# Patient Record
Sex: Female | Born: 1960 | Race: White | Hispanic: No | Marital: Married | Smoking: Former smoker
Health system: Southern US, Community
[De-identification: ages and names within clinical notes are randomized; demographics above are authoritative.]

## PROBLEM LIST (undated history)

## (undated) DIAGNOSIS — Z8619 Personal history of other infectious and parasitic diseases: Secondary | ICD-10-CM

## (undated) DIAGNOSIS — F419 Anxiety disorder, unspecified: Secondary | ICD-10-CM

## (undated) DIAGNOSIS — E559 Vitamin D deficiency, unspecified: Secondary | ICD-10-CM

## (undated) DIAGNOSIS — N939 Abnormal uterine and vaginal bleeding, unspecified: Secondary | ICD-10-CM

## (undated) DIAGNOSIS — K635 Polyp of colon: Secondary | ICD-10-CM

## (undated) DIAGNOSIS — C801 Malignant (primary) neoplasm, unspecified: Secondary | ICD-10-CM

## (undated) DIAGNOSIS — M5412 Radiculopathy, cervical region: Secondary | ICD-10-CM

## (undated) DIAGNOSIS — D219 Benign neoplasm of connective and other soft tissue, unspecified: Secondary | ICD-10-CM

## (undated) DIAGNOSIS — Z9889 Other specified postprocedural states: Secondary | ICD-10-CM

## (undated) DIAGNOSIS — R5383 Other fatigue: Secondary | ICD-10-CM

## (undated) DIAGNOSIS — F509 Eating disorder, unspecified: Secondary | ICD-10-CM

## (undated) DIAGNOSIS — I499 Cardiac arrhythmia, unspecified: Secondary | ICD-10-CM

## (undated) DIAGNOSIS — R002 Palpitations: Secondary | ICD-10-CM

## (undated) DIAGNOSIS — J302 Other seasonal allergic rhinitis: Secondary | ICD-10-CM

## (undated) DIAGNOSIS — K5190 Ulcerative colitis, unspecified, without complications: Secondary | ICD-10-CM

## (undated) DIAGNOSIS — K921 Melena: Secondary | ICD-10-CM

## (undated) DIAGNOSIS — E041 Nontoxic single thyroid nodule: Secondary | ICD-10-CM

## (undated) DIAGNOSIS — R0602 Shortness of breath: Secondary | ICD-10-CM

## (undated) DIAGNOSIS — M542 Cervicalgia: Secondary | ICD-10-CM

## (undated) DIAGNOSIS — M8580 Other specified disorders of bone density and structure, unspecified site: Secondary | ICD-10-CM

## (undated) DIAGNOSIS — D259 Leiomyoma of uterus, unspecified: Secondary | ICD-10-CM

## (undated) DIAGNOSIS — M25561 Pain in right knee: Secondary | ICD-10-CM

## (undated) DIAGNOSIS — I10 Essential (primary) hypertension: Secondary | ICD-10-CM

## (undated) DIAGNOSIS — N959 Unspecified menopausal and perimenopausal disorder: Secondary | ICD-10-CM

## (undated) DIAGNOSIS — C189 Malignant neoplasm of colon, unspecified: Secondary | ICD-10-CM

## (undated) HISTORY — DX: Abnormal uterine and vaginal bleeding, unspecified: N93.9

## (undated) HISTORY — DX: Essential (primary) hypertension: I10

## (undated) HISTORY — DX: Benign neoplasm of connective and other soft tissue, unspecified: D21.9

## (undated) HISTORY — DX: Other specified postprocedural states: Z98.890

## (undated) HISTORY — DX: Palpitations: R00.2

## (undated) HISTORY — DX: Nontoxic single thyroid nodule: E04.1

## (undated) HISTORY — DX: Melena: K92.1

## (undated) HISTORY — DX: Polyp of colon: K63.5

## (undated) HISTORY — DX: Leiomyoma of uterus, unspecified: D25.9

## (undated) HISTORY — DX: Cardiac arrhythmia, unspecified: I49.9

## (undated) HISTORY — DX: Vitamin D deficiency, unspecified: E55.9

## (undated) HISTORY — DX: Pain in right knee: M25.561

## (undated) HISTORY — DX: Malignant neoplasm of colon, unspecified: C18.9

## (undated) HISTORY — DX: Other specified disorders of bone density and structure, unspecified site: M85.80

## (undated) HISTORY — DX: Other seasonal allergic rhinitis: J30.2

## (undated) HISTORY — DX: Eating disorder, unspecified: F50.9

## (undated) HISTORY — DX: Anxiety disorder, unspecified: F41.9

## (undated) HISTORY — DX: Personal history of other infectious and parasitic diseases: Z86.19

## (undated) HISTORY — DX: Other fatigue: R53.83

## (undated) HISTORY — DX: Radiculopathy, cervical region: M54.12

## (undated) HISTORY — DX: Ulcerative colitis, unspecified, without complications: K51.90

## (undated) HISTORY — DX: Unspecified menopausal and perimenopausal disorder: N95.9

## (undated) HISTORY — DX: Malignant (primary) neoplasm, unspecified: C80.1

## (undated) HISTORY — DX: Cervicalgia: M54.2

## (undated) HISTORY — DX: Shortness of breath: R06.02

## (undated) HISTORY — PX: KNEE ARTHROSCOPY: SUR90

---

## 2000-08-13 ENCOUNTER — Other Ambulatory Visit: Admission: RE | Admit: 2000-08-13 | Discharge: 2000-08-13 | Payer: Self-pay | Admitting: Gynecology

## 2001-08-31 ENCOUNTER — Other Ambulatory Visit: Admission: RE | Admit: 2001-08-31 | Discharge: 2001-08-31 | Payer: Self-pay | Admitting: Gynecology

## 2002-09-26 ENCOUNTER — Other Ambulatory Visit: Admission: RE | Admit: 2002-09-26 | Discharge: 2002-09-26 | Payer: Self-pay | Admitting: Gynecology

## 2003-11-18 DIAGNOSIS — C801 Malignant (primary) neoplasm, unspecified: Secondary | ICD-10-CM

## 2003-11-18 HISTORY — DX: Malignant (primary) neoplasm, unspecified: C80.1

## 2003-11-18 HISTORY — PX: COLON SURGERY: SHX602

## 2004-03-28 ENCOUNTER — Encounter (INDEPENDENT_AMBULATORY_CARE_PROVIDER_SITE_OTHER): Payer: Self-pay | Admitting: *Deleted

## 2004-03-28 ENCOUNTER — Inpatient Hospital Stay (HOSPITAL_COMMUNITY): Admission: RE | Admit: 2004-03-28 | Discharge: 2004-04-01 | Payer: Self-pay | Admitting: General Surgery

## 2004-08-19 ENCOUNTER — Other Ambulatory Visit: Admission: RE | Admit: 2004-08-19 | Discharge: 2004-08-19 | Payer: Self-pay | Admitting: Gynecology

## 2005-11-25 ENCOUNTER — Other Ambulatory Visit: Admission: RE | Admit: 2005-11-25 | Discharge: 2005-11-25 | Payer: Self-pay | Admitting: Gynecology

## 2008-11-06 ENCOUNTER — Ambulatory Visit: Payer: Self-pay | Admitting: Radiology

## 2008-11-06 ENCOUNTER — Ambulatory Visit (HOSPITAL_BASED_OUTPATIENT_CLINIC_OR_DEPARTMENT_OTHER): Admission: RE | Admit: 2008-11-06 | Discharge: 2008-11-06 | Payer: Self-pay | Admitting: Family Medicine

## 2010-03-14 ENCOUNTER — Ambulatory Visit (HOSPITAL_BASED_OUTPATIENT_CLINIC_OR_DEPARTMENT_OTHER): Admission: RE | Admit: 2010-03-14 | Discharge: 2010-03-14 | Payer: Self-pay | Admitting: Gynecology

## 2010-11-17 HISTORY — PX: ENDOMETRIAL ABLATION: SHX621

## 2010-11-17 HISTORY — PX: MYOMECTOMY: SHX85

## 2011-02-04 LAB — POCT PREGNANCY, URINE: Preg Test, Ur: NEGATIVE

## 2011-02-04 LAB — POCT HEMOGLOBIN-HEMACUE: Hemoglobin: 13.9 g/dL (ref 12.0–15.0)

## 2011-04-04 NOTE — Discharge Summary (Signed)
NAMEMITCHELLE, Courtney Watson NO.:  [id number]   MEDICAL RECORD NO.:  [id number]                   PATIENT TYPE:  INP   LOCATION:  0470                                 FACILITY:  [facility]   PHYSICIAN:  Adolph Pollack, M.D.            DATE OF BIRTH:  1961-07-31   DATE OF ADMISSION:  03/28/2004  DATE OF DISCHARGE:  04/01/2004                                 DISCHARGE SUMMARY   PRINCIPAL DISCHARGE DIAGNOSIS:  Stage I sigmoid colon cancer.   SECONDARY DIAGNOSES:  None.   PROCEDURE:  Laparoscopic-assisted sigmoid colectomy.   REASON FOR ADMISSION:  This 50 year old female had some change in bowel  habits and some rectal bleeding.  She was seen by a gastroenterologist (Dr.  Vashti Hey, III, M.D.).  She underwent colonoscopy, and had multiple  polyps in her sigmoid colon, one of which was adenocarcinoma extending to  the submucosa.  It may have been completely excised; however, the margin was  less than 1 mm, and there was question as to whether there was residual  tumor.  She was admitted for elective partial colectomy.   HOSPITAL COURSE:  She underwent the above operation.  Postoperatively, she  did fairly well.  She began moving her bowels.  Pathology was negative for  residual cancer, and lymph nodes were negative.  The biopsy site was  identified.  She began tolerating a diet, and the diet was advanced to  regular.  Her bowels were working, the wounds looked good, and she was  discharged Apr 01, 2004 on postoperative day #4.   DISPOSITION:  Discharged to home in satisfactory condition, Apr 01, 2004.   DISCHARGE MEDICATIONS:  She was given Vicodin for pain.   ACTIVITY:  She was given activity restrictions.   FOLLOW UP:  She is due to see me back in a few days for staple removal.                                               Adolph Pollack, M.D.    TJR/MEDQ  D:  04/10/2004  T:  04/10/2004  Job:  [id number]   cc:   Vashti Hey  [address]  [place]  [place] [address]  Fax: [phone]   Almedia Balls  [address]  [place]  [place] [address]  Fax: [phone]

## 2011-04-04 NOTE — Op Note (Signed)
NAME:  Courtney Watson, Courtney Watson NO.:  [id number]   MEDICAL RECORD NO.:  [id number]                   PATIENT TYPE:  INP   LOCATION:  0002                                 FACILITY:  [facility]   PHYSICIAN:  Adolph Pollack, M.D.            DATE OF BIRTH:  1961-09-06   DATE OF PROCEDURE:  DATE OF DISCHARGE:                                 OPERATIVE REPORT   PREOPERATIVE DIAGNOSIS:  Sigmoid colon cancer.   POSTOPERATIVE DIAGNOSIS:  Sigmoid colon cancer.   PROCEDURE:  Laparoscopic-assisted sigmoid colectomy.   SURGEON:  Adolph Pollack, M.D.   ASSISTANT:  Leonie Man, M.D.   ANESTHESIA:  General.   INDICATIONS:  Courtney Watson is a 50 year old female with a change in bowel  habits and had some bright red blood per rectum.  A colonoscopy was  performed by Dr. Marcelene Butte, and she was found to have some sigmoid colon  lesions.  One large polyp was about 2 cm in size, and upon biopsy, it was a  tubovillous adenoma with a base of adenocarcinoma in it.  She subsequently  went back and had the area tattooed along with other sites of polypectomy.  She now presents for a laparoscopic partial colectomy in the sigmoid region.   PROCEDURE:  The risks were explained to her preoperatively.   TECHNIQUE:  She was seen in the holding area and brought to the operating  room, placed supine on the operating room table.  A general anesthetic was  administered.  A Foley catheter was placed in her bladder, and she was  placed in the lithotomy position.  The abdominal wall and perineum were  sterilely prepped and draped.  A small subumbilical  incision was made  through the skin, subcutaneous tissue, fascia, and peritoneum.  A purse-  string suture of 0 Vicryl was placed around the fascial edges.  A Hasson  trocar  was introduced into the peritoneal cavity, and pneumoperitoneum was  created by insufflation of CO2 gas.  Next, under direct vision, a 5 mm  trocar was placed in the  right lower quadrant region.  I used an atraumatic  clamp and began manipulating the bowel.  She had fairly redundant sigmoid  colon, and I was able to pull the sigmoid colon up out of the pelvis and  identify the rectosigmoid junction in the peritoneal reflection area.  I  noted multiple tattoo marks, and the one at 30 cm was easily seen.  I added  a 10 mm trocar through a small lower midline incision.  I then began  mobilizing the sigmoid and descending colon by dividing the lateral  attachments using harmonic scalpel all the way up to the area distal to the  splenic flexure.  I then used blunt dissection to medialize the colon.  I  carried this mobilization down into the pelvic area, localizing the  rectosigmoid junction.  I identified the left ureter and  the right ureter,  and they were kept posterior to the plane of dissection.  I incised the  medial peritoneum on the mesentery, further mobilizing the area.  I was able  to pull the descending colon down to the pubic area and then to the pelvis.  At this point in time, I then made a lower large transverse incision,  incising skin, subcutaneous tissue, and fascia.  The muscle was split  bluntly, and the peritoneum incised.  I grasped the polyp to be a tattooed  mark at 30 cm and brought the sigmoid colon up into the lesion.  I divided  the sigmoid colon just above the sigmoid colon/descending junction.  I then  divided the mesentery between clamps and ligated the vessels, creating a  wedge-shaped mesentery resection.  I then divided the colon approximately 3-  4 cm distal to the last tattooing mark at the rectosigmoid junction and  handed the specimen off the field, marking the distal colon with a suture.   Following this, I inspected the two ends of the colon, the rectosigmoid,  descending colon, and created a single layer end-to-end anastomosis with  interrupted 3-0 silk suture.  The anastomosis was patent, viable, and under  no  tension.  Gloves were changed at this time.  I then irrigated out the  abdominal cavity with copious saline solution.  No active bleeding was  noted.  Sponge, needle, and instrument counts were reported to be correct.  I then closed the peritoneum with a running 2-0 Vicryl suture through the  lower transverse incision and close the fascia with a running #1 PDS suture.  The subcutaneous tissue was irrigated, and the skin was closed with staples.  I reinsufflated the abdomen and inspected the area.  No bleeding was noted.  I looked at the anterior surface of the liver and saw no obvious lesions.  I  subsequently removed the trocars and released the pneumoperitoneum.  The  subumbilical fascial defect was closed by tightening up and tightening down  the purse-string suture.  The remaining skin incisions where the trocars  were are then closed with staples.  Sterile dressings were applied.   She tolerated the procedure well without any apparent complications.  She  subsequently was extubated and taken to the recovery room in satisfactory  condition.                                               Adolph Pollack, M.D.    TJR/MEDQ  D:  03/28/2004  T:  03/28/2004  Job:  [id number]   cc:   Vashti Hey  [address]  [place]  [place] [address]  Fax: [phone]   Almedia Balls  [address]  [place]  [place] [address]  Fax: [phone]

## 2012-02-06 ENCOUNTER — Other Ambulatory Visit: Payer: Self-pay | Admitting: Gastroenterology

## 2012-02-18 ENCOUNTER — Other Ambulatory Visit: Payer: Self-pay | Admitting: Gastroenterology

## 2012-02-18 DIAGNOSIS — R110 Nausea: Secondary | ICD-10-CM

## 2012-02-20 ENCOUNTER — Ambulatory Visit
Admission: RE | Admit: 2012-02-20 | Discharge: 2012-02-20 | Disposition: A | Discharge status: Home / Self Care | Payer: 59 | Source: Ambulatory Visit | Attending: Gastroenterology | Admitting: Gastroenterology

## 2012-02-20 ENCOUNTER — Other Ambulatory Visit: Payer: Self-pay | Admitting: Gastroenterology

## 2012-02-20 DIAGNOSIS — R110 Nausea: Secondary | ICD-10-CM

## 2012-02-20 MED ORDER — IOHEXOL 300 MG/ML  SOLN
100.0000 mL | Freq: Once | INTRAMUSCULAR | Status: AC | PRN
  Administered 2012-02-20: 100 mL via INTRAVENOUS

## 2012-03-10 ENCOUNTER — Other Ambulatory Visit: Payer: Self-pay | Admitting: Gynecology

## 2012-06-01 ENCOUNTER — Other Ambulatory Visit: Payer: Self-pay | Admitting: Gastroenterology

## 2012-06-01 ENCOUNTER — Ambulatory Visit
Admission: RE | Admit: 2012-06-01 | Discharge: 2012-06-01 | Disposition: A | Discharge status: Home / Self Care | Payer: 59 | Source: Ambulatory Visit | Attending: Gastroenterology | Admitting: Gastroenterology

## 2012-06-01 DIAGNOSIS — K5900 Constipation, unspecified: Secondary | ICD-10-CM

## 2012-06-15 ENCOUNTER — Other Ambulatory Visit: Payer: Self-pay | Admitting: Gastroenterology

## 2012-11-17 DIAGNOSIS — M8580 Other specified disorders of bone density and structure, unspecified site: Secondary | ICD-10-CM

## 2012-11-17 HISTORY — DX: Other specified disorders of bone density and structure, unspecified site: M85.80

## 2014-08-30 ENCOUNTER — Other Ambulatory Visit: Payer: Self-pay | Admitting: Gastroenterology

## 2014-08-30 ENCOUNTER — Ambulatory Visit
Admission: RE | Admit: 2014-08-30 | Discharge: 2014-08-30 | Disposition: A | Discharge status: Home / Self Care | Payer: 59 | Source: Ambulatory Visit | Attending: Gastroenterology | Admitting: Gastroenterology

## 2014-08-30 DIAGNOSIS — K5900 Constipation, unspecified: Secondary | ICD-10-CM

## 2014-09-12 DIAGNOSIS — C189 Malignant neoplasm of colon, unspecified: Secondary | ICD-10-CM | POA: Insufficient documentation

## 2014-09-12 DIAGNOSIS — M5412 Radiculopathy, cervical region: Secondary | ICD-10-CM | POA: Insufficient documentation

## 2014-09-12 DIAGNOSIS — M542 Cervicalgia: Secondary | ICD-10-CM | POA: Insufficient documentation

## 2014-09-12 DIAGNOSIS — M8580 Other specified disorders of bone density and structure, unspecified site: Secondary | ICD-10-CM | POA: Insufficient documentation

## 2014-09-12 DIAGNOSIS — F419 Anxiety disorder, unspecified: Secondary | ICD-10-CM | POA: Insufficient documentation

## 2014-09-12 HISTORY — DX: Malignant neoplasm of colon, unspecified: C18.9

## 2014-09-12 HISTORY — DX: Radiculopathy, cervical region: M54.12

## 2014-09-12 HISTORY — DX: Cervicalgia: M54.2

## 2015-01-10 DIAGNOSIS — M5412 Radiculopathy, cervical region: Secondary | ICD-10-CM | POA: Insufficient documentation

## 2015-01-10 HISTORY — DX: Radiculopathy, cervical region: M54.12

## 2015-08-21 ENCOUNTER — Ambulatory Visit (INDEPENDENT_AMBULATORY_CARE_PROVIDER_SITE_OTHER): Payer: 59 | Admitting: Family Medicine

## 2015-08-21 ENCOUNTER — Encounter: Payer: Self-pay | Admitting: Family Medicine

## 2015-08-21 VITALS — BP 138/74 | Ht 68.5 in | Wt 135.0 lb

## 2015-08-21 DIAGNOSIS — M25561 Pain in right knee: Secondary | ICD-10-CM

## 2015-08-21 MED ORDER — METHYLPREDNISOLONE ACETATE 40 MG/ML IJ SUSP
40.0000 mg | Freq: Once | INTRAMUSCULAR | Status: AC
  Administered 2015-08-21: 40 mg via INTRA_ARTICULAR
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
## 2015-08-22 DIAGNOSIS — M25561 Pain in right knee: Secondary | ICD-10-CM

## 2015-08-22 HISTORY — DX: Pain in right knee: M25.561

## 2015-08-22 NOTE — Assessment & Plan Note (Signed)
New power walker without an acute injury who presents with knee pain and stiffness for 1.5 months.  Effusion and possible meniscal pathology (anterior medial) identified on u/s.  Exam relatively unremarkable other than terminal flexion pain and tenderness over the anterior joint line. No locking or catching.  - intrarticular injection performed today.   - Offered XR, but Mrs. Bohnenkamp declined.  - Discussed taking NSAIDs if needed.  - Discussed hip and knee isometric exercises as she is a bit week.  - f/u 3-4 weeks. Call with any issues.

## 2015-09-18 ENCOUNTER — Ambulatory Visit: Payer: 59 | Admitting: Family Medicine

## 2017-03-18 ENCOUNTER — Ambulatory Visit (INDEPENDENT_AMBULATORY_CARE_PROVIDER_SITE_OTHER): Payer: 59 | Admitting: Obstetrics and Gynecology

## 2017-03-18 ENCOUNTER — Encounter: Payer: Self-pay | Admitting: Obstetrics and Gynecology

## 2017-03-18 VITALS — BP 122/74 | HR 70 | Resp 20 | Ht 67.75 in | Wt 159.4 lb

## 2017-03-18 DIAGNOSIS — Z780 Asymptomatic menopausal state: Secondary | ICD-10-CM

## 2017-03-18 DIAGNOSIS — Z01419 Encounter for gynecological examination (general) (routine) without abnormal findings: Secondary | ICD-10-CM

## 2017-03-18 DIAGNOSIS — E559 Vitamin D deficiency, unspecified: Secondary | ICD-10-CM

## 2017-03-18 LAB — COMPREHENSIVE METABOLIC PANEL
ALBUMIN: 4.4 g/dL (ref 3.6–5.1)
ALK PHOS: 84 U/L (ref 33–130)
ALT: 16 U/L (ref 6–29)
AST: 21 U/L (ref 10–35)
BUN: 10 mg/dL (ref 7–25)
CO2: 26 mmol/L (ref 20–31)
CREATININE: 0.59 mg/dL (ref 0.50–1.05)
Calcium: 9.7 mg/dL (ref 8.6–10.4)
Chloride: 103 mmol/L (ref 98–110)
Glucose, Bld: 83 mg/dL (ref 65–99)
POTASSIUM: 4.4 mmol/L (ref 3.5–5.3)
SODIUM: 141 mmol/L (ref 135–146)
TOTAL PROTEIN: 7.4 g/dL (ref 6.1–8.1)
Total Bilirubin: 0.3 mg/dL (ref 0.2–1.2)

## 2017-03-18 LAB — CBC
HCT: 42.6 % (ref 35.0–45.0)
HEMOGLOBIN: 14.2 g/dL (ref 11.7–15.5)
MCH: 31.2 pg (ref 27.0–33.0)
MCHC: 33.3 g/dL (ref 32.0–36.0)
MCV: 93.6 fL (ref 80.0–100.0)
MPV: 10.6 fL (ref 7.5–12.5)
PLATELETS: 258 10*3/uL (ref 140–400)
RBC: 4.55 MIL/uL (ref 3.80–5.10)
RDW: 13.8 % (ref 11.0–15.0)
WBC: 6.4 10*3/uL (ref 3.8–10.8)

## 2017-03-18 LAB — LIPID PANEL
CHOL/HDL RATIO: 1.8 ratio (ref <5.0)
CHOLESTEROL: 240 mg/dL — AB (ref <200)
HDL: 133 mg/dL (ref >50)
LDL Cholesterol: 94 mg/dL (ref <100)
Triglycerides: 66 mg/dL (ref <150)
VLDL: 13 mg/dL (ref <30)

## 2017-03-18 LAB — TSH: TSH: 1.57 mIU/L

## 2017-03-18 NOTE — Progress Notes (Addendum)
56 y.o. G41P0000 Married Caucasian female here for annual exam.    Wants blood work done.   No further real hot flashes but does feel warm at night.   Has some inflammatory bowel disease, ulcerative colitis, following her colon cancer treatment.  Does have hx colon cancer.   Gained almost 40 pounds since 2015.  Stopped smoking.   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 Stressful. Going on a trip out [place].   PCP:  Scarlette Ar, MD  Patient's last menstrual period was 11/17/2014 (approximate).           Sexually active: Yes.   female The current method of family planning is post menopausal status.    Exercising: Yes.    Walks 5 miles Sat. & Sun. Smoker:  Yes, vapes daily  Health Maintenance: Pap:  10/2015 normal with negative HR HPV - Dr. Terri Piedra. History of abnormal Pap:  no MMG:  02/2016 normal per patient:[facility]--appt. 04/2017.   Colonoscopy: 11/2010 polyps with Dr.Outlaw--appt.05/2017.    BMD:   2014??  Result :Osteopenia with Dr. Ubaldo Glassing TDaP:  PCP Gardasil:   no HIV: 20 years ago--Neg Hep C: 2017 Neg Screening Labs:  Hb today: PCP, Urine today: not done   reports that she quit smoking about 5 years ago. Her smoking use included Cigarettes. She has never used smokeless tobacco. She reports that she drinks about 9.0 oz of alcohol per week . She reports that she does not use drugs.  Past Medical History:  Diagnosis Date  . Abnormal uterine bleeding    when she had fibroid tumor  . Anxiety   . Cancer [facility]) 2005   colon   . Fibroid   . Osteopenia 2014   with Dr. Ubaldo Glassing  . Seasonal allergies   . Ulcerative colitis ([place])   . Vitamin D deficiency     Past Surgical History:  Procedure Laterality Date  . COLON SURGERY  2005   colon cancer--Dr.Rosenbower  . ENDOMETRIAL ABLATION  2012   Dr.Lomax  . MYOMECTOMY  2012   Dr.Lomax    Current Outpatient Prescriptions  Medication Sig Dispense Refill  . ALPRAZolam (XANAX) 0.5 MG tablet Take 1 tablet by mouth as needed.     . sertraline (ZOLOFT) 50 MG tablet Take 2 tablets by mouth daily.     No current facility-administered medications for this visit.     Family History  Problem Relation Age of Onset  . Osteoporosis Mother   . Hypertension Mother   . Cancer Father     prostate  . Parkinson's disease Father     Dec age 67 from parkinsons/Lewey Body Dementia?  Marland Kitchen Hypertension Father   . Hyperlipidemia Father   . Osteoarthritis Father   . Rheum arthritis Father   . Cancer Maternal Uncle 45    colon cancer  . Diabetes Maternal Grandfather   . Breast cancer Paternal Grandmother     ROS:  Pertinent items are noted in HPI.  Otherwise, a comprehensive ROS was negative.  Exam:   BP 122/74 (BP Location: Right Arm, Patient Position: Sitting, Cuff Size: Normal)   Pulse 70   Resp 20   Ht 5' 7.75" (1.721 m)   Wt 159 lb 6.4 oz (72.3 kg)   LMP 11/17/2014 (Approximate)   BMI 24.42 kg/m     General appearance: alert, cooperative and appears stated age Head: Normocephalic, without obvious abnormality, atraumatic Neck: no adenopathy, supple, symmetrical, trachea midline and thyroid normal to inspection and palpation Lungs: clear to auscultation bilaterally  Breasts: normal appearance, no masses or tenderness, No nipple retraction or dimpling, No nipple discharge or bleeding, No axillary or supraclavicular adenopathy Heart: regular rate and rhythm Abdomen: small umbilical hernia, soft, non-tender; no masses, no organomegaly Extremities: extremities normal, atraumatic, no cyanosis or edema Skin: Skin color, texture, turgor normal. No rashes or lesions Lymph nodes: Cervical, supraclavicular, and axillary nodes normal. No abnormal inguinal nodes palpated Neurologic: Grossly normal  Pelvic: External genitalia:  no lesions              Urethra:  normal appearing urethra with no masses, tenderness or lesions              Bartholins and Skenes: normal                 Vagina: normal appearing vagina with normal  color and discharge, no lesions              Cervix: no lesions              Pap taken: No. Bimanual Exam:  Uterus:  normal size, contour, position, consistency, mobility, non-tender              Adnexa: no mass, fullness, tenderness              Rectal exam: Yes.  .  Confirms.              Anus:  normal sphincter tone, no lesions  Chaperone was present for exam.  Assessment:   Well woman visit with normal exam. Status post endometrial ablation and myomectomy.  Hx osteopenia.  Hx vit D deficiency.  Personal hx colon cancer.  Vaping.  Small umbilical hernia.  Plan: Mammogram screening discussed. Recommended self breast awareness. Pap and HR HPV as above.  Cotesting next year. Guidelines for Calcium, Vitamin D, regular exercise program including cardiovascular and weight bearing exercise. Will fax order to [facility] for BMD. Routine labs and [place], estradiol. Knows she needs to quit vaping.  Signs and symptoms of incarcerated bowel in hernia dicussed with patient.  Follow up annually and prn.       After visit summary provided.

## 2017-03-18 NOTE — Patient Instructions (Signed)

## 2017-03-19 ENCOUNTER — Telehealth: Payer: Self-pay | Admitting: Obstetrics and Gynecology

## 2017-03-19 ENCOUNTER — Other Ambulatory Visit: Payer: Self-pay | Admitting: Obstetrics and Gynecology

## 2017-03-19 DIAGNOSIS — K429 Umbilical hernia without obstruction or gangrene: Secondary | ICD-10-CM

## 2017-03-19 LAB — FOLLICLE STIMULATING HORMONE: FSH: 93.7 m[IU]/mL

## 2017-03-19 LAB — ESTRADIOL: ESTRADIOL: 15 pg/mL

## 2017-03-19 LAB — VITAMIN D 25 HYDROXY (VIT D DEFICIENCY, FRACTURES): VIT D 25 HYDROXY: 19 ng/mL — AB (ref 30–100)

## 2017-03-19 NOTE — Telephone Encounter (Signed)
Spoke with patient, advised referral placed for Dr. Zella Richer. Advised patient referral has been placed, our referral department will follow up with scheduling. Patient verbalizes understanding and is agreeable.  Routing to provider for final review. Patient is agreeable to disposition. Will close encounter.  Cc: Theresia Lo

## 2017-03-19 NOTE — Telephone Encounter (Signed)
Dr. Quincy Simmonds -see patient message below and advise?

## 2017-03-19 NOTE — Telephone Encounter (Signed)
Dr. Quincy Simmonds -ok to place referral for small umbilical hernia to [facility]?

## 2017-03-19 NOTE — Telephone Encounter (Signed)
Patient saw Dr Quincy Simmonds yesterday and states she told her she had a hernia.  Patient can't remember what kind and would like to know so she can tell her physician.

## 2017-03-19 NOTE — Telephone Encounter (Signed)
I just placed the referral for Dr. Zella Richer.

## 2017-03-19 NOTE — Telephone Encounter (Signed)
Patient has a small umbilical hernia.

## 2017-03-19 NOTE — Telephone Encounter (Signed)
Spoke with patient, advised as seen below per Dr. Quincy Simmonds. Patient states she is going to review with her surgeon for recommendations. Patient thankful for return call.  Routing to provider for final review. Patient is agreeable to disposition. Will close encounter.

## 2017-03-19 NOTE — Telephone Encounter (Signed)
Patient called [facility] to make an appointment to get the hernia looked.  They state that she needs a referral and records sent to Dr Zella Richer so she can schedule.

## 2017-03-20 NOTE — Addendum Note (Signed)
Addended by: Yisroel Ramming, BROOK E on: 03/20/2017 01:09 PM   Modules accepted: Orders

## 2017-03-23 ENCOUNTER — Telehealth: Payer: Self-pay | Admitting: *Deleted

## 2017-03-23 MED ORDER — VITAMIN D (ERGOCALCIFEROL) 1.25 MG (50000 UNIT) PO CAPS: 50000.0000 [IU] | ORAL_CAPSULE | ORAL | 0 refills | Status: DC

## 2017-03-23 NOTE — Telephone Encounter (Signed)
Left message to call Courtney Watson at [phone].  

## 2017-03-23 NOTE — Telephone Encounter (Signed)
-----   Message from Nunzio Cobbs, MD sent at 03/20/2017  1:09 PM EDT ----- Please contact patient with results.   Her Vit D level is low at 19.  I am recommending vit D 50,000 IU every 2 weeks for 3 months. Please sent to her pharmacy.  Please schedule a lab recheck in 3 months.  I will order the future lab.   Her cholesterol panel shows elevated cholesterol, but for a good reason.  Her HDL, the good cholesterol, is really high!  Her cholesterol ratios are excellent.   Her blood chemistries, blood counts, and thyroid are all normal.  She is definitely in menopause by her [place] and estradiol.

## 2017-03-23 NOTE — Telephone Encounter (Signed)
Spoke with patient, advised of results and recommendations as seen below per Dr. Quincy Simmonds. Rx for Vit D to verified pharmacy on file. Patient scheduled for Vit D recheck on 06/25/17 at 3pm. Patient verbalizes understanding and is agreeable.  Routing to provider for final review. Patient is agreeable to disposition. Will close encounter.

## 2017-05-10 ENCOUNTER — Encounter: Payer: Self-pay | Admitting: Obstetrics and Gynecology

## 2017-05-11 ENCOUNTER — Telehealth: Payer: Self-pay | Admitting: *Deleted

## 2017-05-11 NOTE — Telephone Encounter (Signed)
Left voicemail to call back re: BMD results. 

## 2017-05-11 NOTE — Telephone Encounter (Signed)
Return call to Reina °

## 2017-05-12 NOTE — Telephone Encounter (Signed)
Patient notified. Verbalized understanding.   BMD report to scan.

## 2017-05-21 ENCOUNTER — Encounter: Payer: Self-pay | Admitting: Obstetrics and Gynecology

## 2017-06-08 DIAGNOSIS — R0602 Shortness of breath: Secondary | ICD-10-CM

## 2017-06-08 HISTORY — DX: Shortness of breath: R06.02

## 2017-06-22 NOTE — Addendum Note (Signed)
Addended by: Graylon Good on: 06/22/2017 08:21 AM   Modules accepted: Orders

## 2017-06-25 ENCOUNTER — Other Ambulatory Visit: Payer: Self-pay

## 2017-06-25 ENCOUNTER — Other Ambulatory Visit (INDEPENDENT_AMBULATORY_CARE_PROVIDER_SITE_OTHER): Payer: 59

## 2017-06-25 DIAGNOSIS — E559 Vitamin D deficiency, unspecified: Secondary | ICD-10-CM

## 2017-06-26 LAB — VITAMIN D 25 HYDROXY (VIT D DEFICIENCY, FRACTURES): VIT D 25 HYDROXY: 32.4 ng/mL (ref 30.0–100.0)

## 2018-01-25 DIAGNOSIS — E559 Vitamin D deficiency, unspecified: Secondary | ICD-10-CM | POA: Insufficient documentation

## 2018-01-25 DIAGNOSIS — K5190 Ulcerative colitis, unspecified, without complications: Secondary | ICD-10-CM | POA: Insufficient documentation

## 2018-01-25 DIAGNOSIS — R5383 Other fatigue: Secondary | ICD-10-CM | POA: Insufficient documentation

## 2018-01-25 HISTORY — DX: Other fatigue: R53.83

## 2018-03-19 ENCOUNTER — Encounter: Payer: Self-pay | Admitting: Obstetrics and Gynecology

## 2018-03-19 ENCOUNTER — Other Ambulatory Visit: Payer: Self-pay

## 2018-03-19 ENCOUNTER — Ambulatory Visit (INDEPENDENT_AMBULATORY_CARE_PROVIDER_SITE_OTHER): Payer: 59 | Admitting: Obstetrics and Gynecology

## 2018-03-19 ENCOUNTER — Other Ambulatory Visit (HOSPITAL_COMMUNITY)
Admission: RE | Admit: 2018-03-19 | Discharge: 2018-03-19 | Disposition: A | Discharge status: Home / Self Care | Payer: 59 | Source: Ambulatory Visit | Attending: Obstetrics and Gynecology | Admitting: Obstetrics and Gynecology

## 2018-03-19 VITALS — BP 118/70 | HR 80 | Resp 16 | Ht 67.0 in | Wt 162.0 lb

## 2018-03-19 DIAGNOSIS — R7989 Other specified abnormal findings of blood chemistry: Secondary | ICD-10-CM

## 2018-03-19 DIAGNOSIS — Z01419 Encounter for gynecological examination (general) (routine) without abnormal findings: Secondary | ICD-10-CM | POA: Diagnosis present

## 2018-03-19 DIAGNOSIS — I499 Cardiac arrhythmia, unspecified: Secondary | ICD-10-CM

## 2018-03-19 DIAGNOSIS — R102 Pelvic and perineal pain: Secondary | ICD-10-CM | POA: Diagnosis not present

## 2018-03-19 NOTE — Progress Notes (Signed)
Patient scheduled at [facility] in [facility] for first available on 04/06/2018 8:40 am with Dr.Munley.

## 2018-03-19 NOTE — Progress Notes (Signed)
[name] here for annual exam.    Has bilateral lower abdominal pain and low back pain.  Feels like menstrual cramps. Wakes her up at night.  No vaginal bleeding.   Has collagenous colitis.  Hx colon cancer.  Has diarrhea and constipation.  No blood in the stool. Does have dark stools.   States hx of palpitations of heart in past.  Not now.   Labs done with PCP.  PCP: Dr. Scarlette Ar    Patient's last menstrual period was 11/17/2014 (within months).           Sexually active: Yes.    The current method of family planning is post menopausal status.    Exercising: No.  The patient does not participate in regular exercise at present. Smoker:  no  Health Maintenance: Pap:  10/2015 normal with negative HR HPV - Dr. Terri Piedra History of abnormal Pap:  no MMG:  04/27/17 BIRADS 2 benign/density c Colonoscopy:  2018 Dr. Paulita Fujita -- normal per patient BMD:   04/27/17  Result  Osteopenia TDaP:  UTD per patient Gardasil:   n/a HIV: Negative in the past Hep C: 2017 Negative Screening Labs:  PCP   reports that she quit smoking about 6 years ago. Her smoking use included cigarettes. She has never used smokeless tobacco. She reports that she drinks about 9.0 oz of alcohol per week. She reports that she does not use drugs.  Past Medical History:  Diagnosis Date  . Abnormal uterine bleeding    when she had fibroid tumor  . Anxiety   . Cancer [facility]) 2005   colon   . Fibroid   . Osteopenia 2018   hips and spine  . Seasonal allergies   . Ulcerative colitis ([place])   . Vitamin D deficiency     Past Surgical History:  Procedure Laterality Date  . COLON SURGERY  2005   colon cancer--Dr.Rosenbower  . ENDOMETRIAL ABLATION  2012   Dr.Lomax  . MYOMECTOMY  2012   Dr.Lomax    Current Outpatient Medications  Medication Sig Dispense Refill  . albuterol (PROVENTIL HFA;VENTOLIN HFA) 108 (90 Base) MCG/ACT inhaler Inhale into the lungs as needed.    .  ALPRAZolam (XANAX) 0.5 MG tablet Take 1 tablet by mouth as needed.    . sertraline (ZOLOFT) 50 MG tablet Take 2 tablets by mouth daily.    . Vitamin D, Ergocalciferol, (DRISDOL) 50000 units CAPS capsule Take 1 capsule (50,000 Units total) by mouth every 14 (fourteen) days. (Patient not taking: Reported on 03/19/2018) 6 capsule 0   No current facility-administered medications for this visit.     Family History  Problem Relation Age of Onset  . Osteoporosis Mother   . Hypertension Mother   . Cancer Father        prostate  . Parkinson's disease Father        Dec age 61 from parkinsons/Lewey Body Dementia?  Marland Kitchen Hypertension Father   . Hyperlipidemia Father   . Osteoarthritis Father   . Rheum arthritis Father   . Cancer Maternal Uncle 45       colon cancer  . Diabetes Maternal Grandfather   . Breast cancer Paternal Grandmother     Review of Systems  Constitutional:       Weight gain   HENT: Negative.   Respiratory: Negative.   Cardiovascular: Negative.   Gastrointestinal: Positive for abdominal pain.       Bloating Constipation Diarrhea Change in quality/character of stools  Endocrine: Negative.   Genitourinary: Negative.   Musculoskeletal:       Muscle or joint pain  Skin: Negative.   Allergic/Immunologic: Negative.   Neurological: Negative.   Hematological: Bruises/bleeds easily.  Psychiatric/Behavioral: Negative.     Exam:   BP 118/70 (BP Location: Right Arm, Patient Position: Sitting, Cuff Size: Normal)   Pulse 80   Resp 16   Ht 5\' 7"  (1.702 m)   Wt 162 lb (73.5 kg)   LMP 11/17/2014 (Within Months)   BMI 25.37 kg/m     General appearance: alert, cooperative and appears stated age Head: Normocephalic, without obvious abnormality, atraumatic Neck: no adenopathy, supple, symmetrical, trachea midline and thyroid normal to inspection and palpation Lungs: clear to auscultation bilaterally Breasts: normal appearance, no masses or tenderness, No nipple retraction or  dimpling, No nipple discharge or bleeding, No axillary or supraclavicular adenopathy Heart: regular rate and irregular rhythm. Abdomen: small umbilical hernia, abdomen is soft, non-tender; no masses, no organomegaly Extremities: extremities normal, atraumatic, no cyanosis or edema Skin: Skin color, texture, turgor normal. No rashes or lesions Lymph nodes: Cervical, supraclavicular, and axillary nodes normal. No abnormal inguinal nodes palpated Neurologic: Grossly normal  Pelvic: External genitalia:  no lesions              Urethra:  normal appearing urethra with no masses, tenderness or lesions              Bartholins and Skenes: normal                 Vagina: normal appearing vagina with normal color and discharge, no lesions              Cervix: no lesions              Pap taken: Yes.   Bimanual Exam:  Uterus:  normal size, contour, position, consistency, mobility, non-tender              Adnexa: no mass, fullness, tenderness              Rectal exam: Yes.  .  Confirms.              Anus:  normal sphincter tone, no lesions  Chaperone was present for exam.  Assessment:   Well woman visit with normal exam. Pelvic pain.  Status post endometrial ablation and myomectomy.  Small umbilical hernia. Hx osteopenia.  Hx vit D deficiency.  Personal hx colon cancer and collagenous colitis. Change in bowel function. Cardiac arrhythmia.   Plan: Mammogram screening yearly.  Recommended self breast awareness. Pap and HR HPV as above. Guidelines for Calcium, Vitamin D, regular exercise program including cardiovascular and weight bearing exercise. Referral to cardiology.  Return for pelvic US.  She will contact her GI for an appointment.   BMD next year.  Will check vit D level today. Follow up annually and prn.   After visit summary provided.

## 2018-03-19 NOTE — Patient Instructions (Signed)

## 2018-03-20 LAB — VITAMIN D 25 HYDROXY (VIT D DEFICIENCY, FRACTURES): Vit D, 25-Hydroxy: 15.8 ng/mL — ABNORMAL LOW (ref 30.0–100.0)

## 2018-03-21 NOTE — Addendum Note (Signed)
Addended by: Yisroel Ramming, Dietrich Pates E on: 03/21/2018 12:33 PM   Modules accepted: Orders

## 2018-03-22 ENCOUNTER — Telehealth: Payer: Self-pay

## 2018-03-22 MED ORDER — VITAMIN D (ERGOCALCIFEROL) 1.25 MG (50000 UNIT) PO CAPS: 50000.0000 [IU] | ORAL_CAPSULE | ORAL | 3 refills | Status: DC

## 2018-03-22 NOTE — Telephone Encounter (Signed)
Returning call to Amanda.

## 2018-03-22 NOTE — Telephone Encounter (Signed)
Spoke with patient and notified of low vitamin d level. Advised needs to go back on Rx Vitamin d 50,000 IU every other week x1year. She needs 3 month recheck with lab appt made 06-11-18. Rx sent to pharmacy on file for.

## 2018-03-22 NOTE — Telephone Encounter (Signed)
Called patient and left message for her to return my call. 

## 2018-03-22 NOTE — Telephone Encounter (Signed)
-----   Message from Nunzio Cobbs, MD sent at 03/21/2018 12:33 PM EDT ----- Please contact patient with results. I recommend she go back on the vitamin D3 50,000 IU every other week.  I will give her refills for one year.  I am recommending she does have a lab recheck in 3 months.  I will place a future order.

## 2018-03-23 ENCOUNTER — Telehealth: Payer: Self-pay | Admitting: Obstetrics and Gynecology

## 2018-03-23 ENCOUNTER — Ambulatory Visit (INDEPENDENT_AMBULATORY_CARE_PROVIDER_SITE_OTHER): Payer: 59

## 2018-03-23 DIAGNOSIS — R102 Pelvic and perineal pain: Secondary | ICD-10-CM

## 2018-03-23 LAB — CYTOLOGY - PAP
Diagnosis: NEGATIVE
HPV: NOT DETECTED

## 2018-03-23 NOTE — Telephone Encounter (Signed)
Patient would like nurse to check if she still need to come in for consult Monday after today's ultrasound. She said everything looked fine.

## 2018-03-23 NOTE — Telephone Encounter (Signed)
Spoke with patient. Advised to keep consult scheduled for 03/29/18 at 10:30am with Dr. Quincy Simmonds. Dr. Quincy Simmonds will review PUS results at that [place]. Patient verbalizes understanding.  Routing to provider for final review. Patient is agreeable to disposition. Will close encounter.

## 2018-03-29 ENCOUNTER — Other Ambulatory Visit: Payer: Self-pay

## 2018-03-29 ENCOUNTER — Ambulatory Visit: Payer: 59 | Admitting: Obstetrics and Gynecology

## 2018-03-29 ENCOUNTER — Encounter: Payer: Self-pay | Admitting: Obstetrics and Gynecology

## 2018-03-29 VITALS — BP 126/70 | HR 88 | Resp 16 | Ht 67.0 in | Wt 163.0 lb

## 2018-03-29 DIAGNOSIS — R109 Unspecified abdominal pain: Secondary | ICD-10-CM | POA: Diagnosis not present

## 2018-03-29 NOTE — Progress Notes (Signed)
GYNECOLOGY  VISIT   HPI: 57 y.o.   Married  Caucasian  female   G0P0000 with Patient's last menstrual period was 11/17/2014 (within months).   here for consult after PUS.  Has symptoms of bilateral lower abdominal cramping pain and low back pain.    Wakes her up at night. Hx endometrial ablation. No vaginal bleeding.     Hx colon cancer and collagenous colitis.  Her GI is Dr. Paulita Fujita.   Has appt with cardiology on 04/06/18.  Still with palpitations.   GYNECOLOGIC HISTORY: Patient's last menstrual period was 11/17/2014 (within months). Contraception:  Postmenopausal Menopausal hormone therapy:  none Last mammogram:  04/27/17 BIRADS 2 benign/density c Last pap smear:   03/19/18 Neg:Neg HR HPV        OB History    Gravida  0   Para  0   Term  0   Preterm  0   AB  0   Living  0     SAB  0   TAB  0   Ectopic  0   Multiple  0   Live Births  0              Patient Active Problem List   Diagnosis Date Noted  . Knee pain, right 08/22/2015    Past Medical History:  Diagnosis Date  . Abnormal uterine bleeding    when she had fibroid tumor  . Anxiety   . Cancer [facility]) 2005   colon   . Fibroid   . Osteopenia 2018   hips and spine  . Seasonal allergies   . Ulcerative colitis ([place])   . Vitamin D deficiency     Past Surgical History:  Procedure Laterality Date  . COLON SURGERY  2005   colon cancer--Dr.Rosenbower  . ENDOMETRIAL ABLATION  2012   Dr.Lomax  . MYOMECTOMY  2012   Dr.Lomax    Current Outpatient Medications  Medication Sig Dispense Refill  . albuterol (PROVENTIL HFA;VENTOLIN HFA) 108 (90 Base) MCG/ACT inhaler Inhale into the lungs as needed.    . ALPRAZolam (XANAX) 0.5 MG tablet Take 1 tablet by mouth as needed.    . sertraline (ZOLOFT) 50 MG tablet Take 2 tablets by mouth daily.    . Vitamin D, Ergocalciferol, (DRISDOL) 50000 units CAPS capsule Take 1 capsule (50,000 Units total) by mouth every 14 (fourteen) days. 6 capsule 3   No current  facility-administered medications for this visit.      ALLERGIES: Patient has no known allergies.  Family History  Problem Relation Age of Onset  . Osteoporosis Mother   . Hypertension Mother   . Cancer Father        prostate  . Parkinson's disease Father        Dec age 16 from parkinsons/Lewey Body Dementia?  Marland Kitchen Hypertension Father   . Hyperlipidemia Father   . Osteoarthritis Father   . Rheum arthritis Father   . Cancer Maternal Uncle 45       colon cancer  . Diabetes Maternal Grandfather   . Breast cancer Paternal Grandmother     Social History   Socioeconomic History  . Marital status: Married    Spouse name: Not on file  . Number of children: Not on file  . Years of education: Not on file  . Highest education level: Not on file  Occupational History  . Not on file  Social Needs  . Financial resource strain: Not on file  . Food insecurity:  Worry: Not on file    Inability: Not on file  . Transportation needs:    Medical: Not on file    Non-medical: Not on file  Tobacco Use  . Smoking status: Former Smoker    Types: Cigarettes    Last attempt to quit: 11/18/2011    Years since quitting: 6.3  . Smokeless tobacco: Never Used  Substance and Sexual Activity  . Alcohol use: Yes    Alcohol/week: 9.0 oz    Types: 15 Cans of beer per week  . Drug use: No  . Sexual activity: Yes    Partners: Male    Birth control/protection: Post-menopausal    Comment: Ablation 2012  Lifestyle  . Physical activity:    Days per week: Not on file    Minutes per session: Not on file  . Stress: Not on file  Relationships  . Social connections:    Talks on phone: Not on file    Gets together: Not on file    Attends religious service: Not on file    Active member of club or organization: Not on file    Attends meetings of clubs or organizations: Not on file    Relationship status: Not on file  . Intimate partner violence:    Fear of current or ex partner: Not on file     Emotionally abused: Not on file    Physically abused: Not on file    Forced sexual activity: Not on file  Other Topics Concern  . Not on file  Social History Narrative  . Not on file    Review of Systems  Constitutional: Negative.   HENT: Negative.   Eyes: Negative.   Respiratory: Negative.   Cardiovascular: Negative.   Gastrointestinal: Positive for abdominal pain, constipation and diarrhea.       Bloating  Endocrine: Negative.   Genitourinary: Negative.   Musculoskeletal: Negative.   Skin: Negative.   Allergic/Immunologic: Negative.   Neurological: Negative.   Hematological: Negative.   Psychiatric/Behavioral: Negative.     PHYSICAL EXAMINATION:    BP 126/70 (BP Location: Right Arm, Patient Position: Sitting, Cuff Size: Normal)   Pulse 88   Resp 16   Ht 5\' 7"  (1.702 m)   Wt 163 lb (73.9 kg)   LMP 11/17/2014 (Within Months)   BMI 25.53 kg/m     General appearance: alert, cooperative and appears stated age   Pelvic US: Normal uterus, no masses.  Thin EMS.  Normal ovaries.  No free fluid.    ASSESSMENT  Pelvic cramping.  Normal pelvic ultrasound.  Low back ache. Hx colon cancer and colitis.  Diverticulitis?  PLAN  Reassurance regarding her reproductive anatomy.  Will assist in making appointment for patient to see her GI.  If she is unable to get in soon, I will order a CT of the abdomen and pelvis.   An After Visit Summary was printed and given to the patient.  ___15___ minutes face to face time of which over 50% was spent in counseling.

## 2018-03-29 NOTE — Progress Notes (Signed)
Scheduled patient while in office to see Dr.Outlaw tomorrow 03/30/2018 at 3:30 pm. Patient is agreeable to date and time.

## 2018-04-01 DIAGNOSIS — N959 Unspecified menopausal and perimenopausal disorder: Secondary | ICD-10-CM

## 2018-04-01 DIAGNOSIS — D219 Benign neoplasm of connective and other soft tissue, unspecified: Secondary | ICD-10-CM | POA: Insufficient documentation

## 2018-04-01 DIAGNOSIS — I499 Cardiac arrhythmia, unspecified: Secondary | ICD-10-CM

## 2018-04-01 DIAGNOSIS — R002 Palpitations: Secondary | ICD-10-CM | POA: Insufficient documentation

## 2018-04-01 DIAGNOSIS — C189 Malignant neoplasm of colon, unspecified: Secondary | ICD-10-CM

## 2018-04-01 DIAGNOSIS — D259 Leiomyoma of uterus, unspecified: Secondary | ICD-10-CM | POA: Insufficient documentation

## 2018-04-01 HISTORY — DX: Malignant neoplasm of colon, unspecified: C18.9

## 2018-04-01 HISTORY — DX: Unspecified menopausal and perimenopausal disorder: N95.9

## 2018-04-01 HISTORY — DX: Leiomyoma of uterus, unspecified: D25.9

## 2018-04-01 HISTORY — DX: Palpitations: R00.2

## 2018-04-01 HISTORY — DX: Cardiac arrhythmia, unspecified: I49.9

## 2018-04-05 NOTE — Progress Notes (Signed)
Cardiology Office Note:    Date:  04/06/2018   ID:  Courtney Watson, DOB 10/14/1961, MRN [id number]  PCP:  Delilah Shan, MD  Cardiologist:  Shirlee More, MD   Referring MD: Delilah Shan, MD  ASSESSMENT:    1. Palpitations   2. SOB (shortness of breath)    PLAN:    In order of problems listed above:  1. By previous and today's physical exam she is having premature complexes with pause we did not capture an EKG and have asked her where Holter monitor 48 hours in order to determine her arrhythmia and the burden.  At this time she is asymptomatic and I do not think she will require suppressant treatment.  I asked her to avoid over-the-counter proarrhythmic medications and her vape nicotine may be provocative.  I have encouraged nicotine cessation 2. Mild she has greater than 30-pack-year cigarette smoking history she may be having symptoms on the basis of structural lung changes but asked her to have an echocardiogram performed in view of her arrhythmia.  Next appointment   Medication Adjustments/Labs and Tests Ordered: Current medicines are reviewed at length with the patient today.  Concerns regarding medicines are outlined above.  Orders Placed This Encounter  Procedures  . HOLTER MONITOR - 48 HOUR  . EKG 12-Lead  . ECHOCARDIOGRAM COMPLETE   No orders of the defined types were placed in this encounter.    Chief Complaint  Patient presents with  . New Patient (Initial Visit)  . Palpitations    History of Present Illness:    Courtney Watson is a 57 y.o. female who is being seen today for the evaluation of palpitation at the request of Dr Quincy Simmonds.  It was noted that she had an irregular pulse during the office visit she has no cardiac awareness.  She has no history of murmur congenital rheumatic heart disease she has mild exertional shortness of breath when she climbs stairs but is capable of doing daily activities no edema orthopnea PND she also has a cough and bronchospasm  that she attributes to her vape.  She takes no over-the-counter proarrhythmic medications.  Past Medical History:  Diagnosis Date  . Abnormal uterine bleeding    when she had fibroid tumor  . Anxiety   . 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   Overview:  Clinically Left C4 Clinically Left C4  . Cancer [facility]) 2005   colon   . Cervical radiculopathy 01/10/2015  . Fatigue 01/25/2018  . Fibroid   . Irregular heart beat 04/01/2018  . Knee pain, right 08/22/2015  . 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 Malignant tumor of colon ([place]) 04/01/2018  . Neck pain 09/12/2014  . Osteopenia 2018   hips and spine  . Palpitations 04/01/2018  . Perimenopausal disorder 04/01/2018  . Seasonal allergies   . Shortness of breath on exertion 06/08/2017  . Ulcerative colitis ([place])   . Uterine leiomyoma 04/01/2018  . Vitamin D deficiency     Past Surgical History:  Procedure Laterality Date  . COLON SURGERY  2005   colon cancer--Dr.Rosenbower  . ENDOMETRIAL ABLATION  2012   Dr.Lomax  . MYOMECTOMY  2012   Dr.Lomax    Current Medications: Current Meds  Medication Sig  . albuterol (PROVENTIL HFA;VENTOLIN HFA) 108 (90 Base) MCG/ACT inhaler Inhale into the lungs as needed.  . ALPRAZolam (XANAX) 0.5 MG tablet Take 1 tablet by mouth as needed.  . sertraline (ZOLOFT) 100 MG tablet Take 1 tablet by  mouth daily.   . Vitamin D, Ergocalciferol, (DRISDOL) 50000 units CAPS capsule Take 1 capsule (50,000 Units total) by mouth every 14 (fourteen) days.     Allergies:   Patient has no known allergies.   Social History   Socioeconomic History  . Marital status: Married    Spouse name: Not on file  . Number of children: Not on file  . Years of education: Not on file  . Highest education level: Not on file  Occupational History  . Not on file  Social Needs  . Financial resource strain: Not on file  . Food insecurity:    Worry: Not on file    Inability: Not on file  . Transportation  needs:    Medical: Not on file    Non-medical: Not on file  Tobacco Use  . Smoking status: Former Smoker    Types: Cigarettes    Last attempt to quit: 11/18/2011    Years since quitting: 6.3  . Smokeless tobacco: Never Used  Substance and Sexual Activity  . Alcohol use: Yes    Alcohol/week: 1.2 - 1.8 oz    Types: 2 - 3 Cans of beer per week    Comment: 2 -3 beers everyday  . Drug use: No  . Sexual activity: Yes    Partners: Male    Birth control/protection: Post-menopausal    Comment: Ablation 2012  Lifestyle  . Physical activity:    Days per week: Not on file    Minutes per session: Not on file  . Stress: Not on file  Relationships  . Social connections:    Talks on phone: Not on file    Gets together: Not on file    Attends religious service: Not on file    Active member of club or organization: Not on file    Attends meetings of clubs or organizations: Not on file    Relationship status: Not on file  Other Topics Concern  . Not on file  Social History Narrative  . Not on file     Family History: The patient's family history includes Breast cancer in her paternal grandmother; Cancer in her father; Cancer (age of onset: 75) in her maternal uncle; Diabetes in her maternal grandfather; Hyperlipidemia in her father; Hypertension in her father and mother; Osteoarthritis in her father; Osteoporosis in her mother; Parkinson's disease in her father; Rheum arthritis in her father.  ROS:   Review of Systems  HENT: Negative.   Cardiovascular: Positive for dyspnea on exertion and orthopnea.  Respiratory: Positive for cough, shortness of breath, snoring and wheezing.   Endocrine: Negative.   Musculoskeletal: Negative.   Gastrointestinal: Positive for abdominal pain, constipation and diarrhea.  Genitourinary: Negative.   Neurological: Positive for dizziness.  Psychiatric/Behavioral: The patient is nervous/anxious.   Allergic/Immunologic: Negative.    Please see the history of  present illness.     All other systems reviewed and are negative.  EKGs/Labs/Other Studies Reviewed:    The following studies were reviewed today:   EKG:  EKG is  ordered today.  The ekg ordered today demonstrates sinus rhythm normal  Recent Labs: No results found for requested labs within last 8760 hours.  Recent Lipid Panel    Component Value Date/Time   CHOL 240 (H) 03/18/2017 1506   TRIG 66 03/18/2017 1506   HDL 133 03/18/2017 1506   CHOLHDL 1.8 03/18/2017 1506   VLDL 13 03/18/2017 1506   LDLCALC 94 03/18/2017 1506    Physical Exam:  VS:  BP (!) 152/86 (BP Location: Left Arm, Patient Position: Sitting, Cuff Size: Normal)   Pulse 74   Ht 5\' 8"  (1.727 m)   Wt 162 lb (73.5 kg)   SpO2 98%   BMI 24.63 kg/m     Wt Readings from Last 3 Encounters:  04/06/18 162 lb (73.5 kg)  03/29/18 163 lb (73.9 kg)  03/23/18 162 lb (73.5 kg)     GEN:  Well nourished, well developed in no acute distress HEENT: Normal NECK: No JVD; No carotid bruits LYMPHATICS: No lymphadenopathy CARDIAC: RRR, no murmurs, rubs, gallops RESPIRATORY:  Clear to auscultation without rales, wheezing or rhonchi  ABDOMEN: Soft, non-tender, non-distended MUSCULOSKELETAL:  No edema; No deformity  SKIN: Warm and dry NEUROLOGIC:  Alert and oriented x 3 PSYCHIATRIC:  Normal affect     Signed, Shirlee More, MD  04/06/2018 9:06 AM    [place]

## 2018-04-06 ENCOUNTER — Encounter: Payer: Self-pay | Admitting: Cardiology

## 2018-04-06 ENCOUNTER — Ambulatory Visit: Payer: 59 | Admitting: Cardiology

## 2018-04-06 VITALS — BP 152/86 | HR 74 | Ht 68.0 in | Wt 162.0 lb

## 2018-04-06 DIAGNOSIS — R0602 Shortness of breath: Secondary | ICD-10-CM | POA: Diagnosis not present

## 2018-04-06 DIAGNOSIS — R002 Palpitations: Secondary | ICD-10-CM

## 2018-04-06 NOTE — Patient Instructions (Addendum)
Medication Instructions:  Your physician recommends that you continue on your current medications as directed. Please refer to the Current Medication list given to you today.  Labwork: None  Testing/Procedures: You had an EKG today.  Your physician has requested that you have an echocardiogram. Echocardiography is a painless test that uses sound waves to create images of your heart. It provides your doctor with information about the size and shape of your heart and how well your heart's chambers and valves are working. This procedure takes approximately one hour. There are no restrictions for this procedure.  Your physician has recommended that you wear a holter monitor. Holter monitors are medical devices that record the heart's electrical activity. Doctors most often use these monitors to diagnose arrhythmias. Arrhythmias are problems with the speed or rhythm of the heartbeat. The monitor is a small, portable device. You can wear one while you do your normal daily activities. This is usually used to diagnose what is causing palpitations/syncope (passing out). 48 hours.  Follow-Up: Your physician recommends that you schedule a follow-up appointment in: 4 weeks.  Any Other Special Instructions Will Be Listed Below (If Applicable).     If you need a refill on your cardiac medications before your next appointment, please call your pharmacy.    1. Avoid all over-the-counter antihistamines except Claritin/Loratadine and Zyrtec/Cetrizine. 2. Avoid all combination including cold sinus allergies flu decongestant and sleep medications 3. You can use Robitussin DM Mucinex and Mucinex DM for cough. 4. can use Tylenol aspirin ibuprofen and naproxen but no combinations such as sleep or sinus.

## 2018-04-08 ENCOUNTER — Other Ambulatory Visit: Payer: 59 | Admitting: Obstetrics and Gynecology

## 2018-04-08 ENCOUNTER — Other Ambulatory Visit: Payer: 59

## 2018-04-09 ENCOUNTER — Telehealth: Payer: Self-pay | Admitting: Obstetrics and Gynecology

## 2018-04-09 NOTE — Telephone Encounter (Signed)
Patient would like to speak with nurse about an appointment she had with a gastroenterologist that was cancelled and she need help getting back into their office.

## 2018-04-13 NOTE — Telephone Encounter (Signed)
Spoke with patient. Was scheduled to see Dr. Paulita Fujita at [place] on 03/30/18. Patient states her appt was cancelled by provider due to family emergency and reschuled to 6/18. Patient request earlier appt. Advised I can call [facility] and return call, patient agreeable.

## 2018-04-13 NOTE — Telephone Encounter (Signed)
Spoke with Chaundra at [facility]. Was advised patient can be seen by Deliah Goody, PA on 5/29 at 8:45am. Advised to keep 6/18 as previously scheduled until reviewed with patient. I will review with patient and return call.   Call returned to patient. Advised as seen above. Patient states she needs to review her work schedule and will return call directly to [facility] within the next 5 min if she can make 5/29 8:45 am appt. Patient is aware that RN did not cancel 6/18 appt with Dr. Paulita Fujita, they are awaiting return call. Advised to return call to [facility] if any additional assistance is needed. Patient verbalizes understanding.   Routing to provider for final review. Patient is agreeable to disposition. Will close encounter.

## 2018-04-19 ENCOUNTER — Telehealth: Payer: Self-pay | Admitting: Cardiology

## 2018-04-19 NOTE — Telephone Encounter (Signed)
Patient states her echo copay is based on how we code order. Can you let her know the code so she can find out what her copay is?

## 2018-04-19 NOTE — Telephone Encounter (Signed)
[place] 93[address]. Patient verbalized understanding. No further questions.

## 2018-04-22 ENCOUNTER — Ambulatory Visit (HOSPITAL_BASED_OUTPATIENT_CLINIC_OR_DEPARTMENT_OTHER)
Admission: RE | Admit: 2018-04-22 | Discharge: 2018-04-22 | Disposition: A | Discharge status: Home / Self Care | Payer: 59 | Source: Ambulatory Visit | Attending: Cardiology | Admitting: Cardiology

## 2018-04-22 ENCOUNTER — Ambulatory Visit: Payer: 59

## 2018-04-22 DIAGNOSIS — R0602 Shortness of breath: Secondary | ICD-10-CM | POA: Insufficient documentation

## 2018-04-22 DIAGNOSIS — R002 Palpitations: Secondary | ICD-10-CM | POA: Insufficient documentation

## 2018-04-22 NOTE — Progress Notes (Signed)
  Echocardiogram 2D Echocardiogram has been performed.  Courtney Watson 04/22/2018, 8:32 AM

## 2018-04-27 ENCOUNTER — Encounter: Payer: Self-pay | Admitting: Obstetrics and Gynecology

## 2018-04-29 ENCOUNTER — Ambulatory Visit: Payer: 59 | Admitting: Cardiology

## 2018-06-11 ENCOUNTER — Other Ambulatory Visit: Payer: 59

## 2018-06-16 ENCOUNTER — Ambulatory Visit: Payer: 59 | Admitting: Cardiology

## 2018-06-16 ENCOUNTER — Encounter: Payer: Self-pay | Admitting: Cardiology

## 2018-06-16 VITALS — BP 130/80 | HR 78 | Ht 68.0 in | Wt 164.8 lb

## 2018-06-16 DIAGNOSIS — I491 Atrial premature depolarization: Secondary | ICD-10-CM | POA: Diagnosis not present

## 2018-06-16 NOTE — Patient Instructions (Addendum)
Medication Instructions:  Your physician recommends that you continue on your current medications as directed. Please refer to the Current Medication list given to you today.   Labwork: None  Testing/Procedures: None  Follow-Up: Your physician wants you to follow-up in: 1 year. You will receive a reminder letter in the mail two months in advance. If you don't receive a letter, please call our office to schedule the follow-up appointment.   If you need a refill on your cardiac medications before your next appointment, please call your pharmacy.   Thank you for choosing [facility]! Robyne Peers, RN [phone]       1. Avoid all over-the-counter antihistamines except Claritin/Loratadine and Zyrtec/Cetrizine. 2. Avoid all combination including cold sinus allergies flu decongestant and sleep medications 3. You can use Robitussin DM Mucinex and Mucinex DM for cough. 4. can use Tylenol aspirin ibuprofen and naproxen but no combinations such as sleep or sinus.  Screen your pulse and call if rates are > 110-120 BPM

## 2018-06-16 NOTE — Progress Notes (Signed)
Cardiology Office Note:    Date:  06/16/2018   ID:  Courtney Watson, DOB 20-Jul-1961, MRN [id number]  PCP:  Delilah Shan, MD  Cardiologist:  Shirlee More, MD    Referring MD: Delilah Shan, MD    ASSESSMENT:    1. APC (atrial premature contractions)    PLAN:    In order of problems listed above:  1. She remains asymptomatic and at this time I would not suppress her heart rhythm I asked her to screen her heart rate with a pulse meter if she is having episodes greater than 110-120 that would be a good marker to initiate a beta-blocker.  She will continue to avoid over-the-counter proarrhythmic medications and I will plan to see back in the office in 1 year.   Next appointment: One year   Medication Adjustments/Labs and Tests Ordered: Current medicines are reviewed at length with the patient today.  Concerns regarding medicines are outlined above.  No orders of the defined types were placed in this encounter.  No orders of the defined types were placed in this encounter.   Chief Complaint  Patient presents with  . 1 month follow up    History of Present Illness:    Courtney Watson is a 57 y.o. female with a hx of extrasystoles on her pulse exam  last seen 04/06/18. Compliance with diet, lifestyle and medications: Yes She remains asymptomatic no palpitations syncope TIA chest pain or shortness of breath she avoids over-the-counter proarrhythmic drugs Past Medical History:  Diagnosis Date  . Abnormal uterine bleeding    when she had fibroid tumor  . Anxiety   . 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   Overview:  Clinically Left C4 Clinically Left C4  . Cancer [facility]) 2005   colon   . Cervical radiculopathy 01/10/2015  . Fatigue 01/25/2018  . Fibroid   . Irregular heart beat 04/01/2018  . Knee pain, right 08/22/2015  . 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 Malignant tumor of colon ([place]) 04/01/2018  . Neck pain 09/12/2014  . Osteopenia 2018   hips and spine  . Palpitations 04/01/2018  . Perimenopausal disorder 04/01/2018  . Seasonal allergies   . Shortness of breath on exertion 06/08/2017  . Ulcerative colitis ([place])   . Uterine leiomyoma 04/01/2018  . Vitamin D deficiency     Past Surgical History:  Procedure Laterality Date  . COLON SURGERY  2005   colon cancer--Dr.Rosenbower  . ENDOMETRIAL ABLATION  2012   Dr.Lomax  . MYOMECTOMY  2012   Dr.Lomax    Current Medications: Current Meds  Medication Sig  . albuterol (PROVENTIL HFA;VENTOLIN HFA) 108 (90 Base) MCG/ACT inhaler Inhale into the lungs as needed.  . ALPRAZolam (XANAX) 0.5 MG tablet Take 1 tablet by mouth as needed.  . sertraline (ZOLOFT) 100 MG tablet Take 1 tablet by mouth daily.   . Vitamin D, Ergocalciferol, (DRISDOL) 50000 units CAPS capsule Take 1 capsule (50,000 Units total) by mouth every 14 (fourteen) days.     Allergies:   Patient has no known allergies.   Social History   Socioeconomic History  . Marital status: Married    Spouse name: Not on file  . Number of children: Not on file  . Years of education: Not on file  . Highest education level: Not on file  Occupational History  . Not on file  Social Needs  . Financial resource strain: Not on file  . Food insecurity:    Worry: Not  on file    Inability: Not on file  . Transportation needs:    Medical: Not on file    Non-medical: Not on file  Tobacco Use  . Smoking status: Former Smoker    Types: Cigarettes    Last attempt to quit: 11/18/2011    Years since quitting: 6.5  . Smokeless tobacco: Never Used  Substance and Sexual Activity  . Alcohol use: Yes    Alcohol/week: 1.2 - 1.8 oz    Types: 2 - 3 Cans of beer per week    Comment: 2 -3 beers everyday  . Drug use: No  . Sexual activity: Yes    Partners: Male    Birth control/protection: Post-menopausal    Comment: Ablation 2012  Lifestyle  . Physical activity:    Days per week: Not on file    Minutes per session: Not on file  .  Stress: Not on file  Relationships  . Social connections:    Talks on phone: Not on file    Gets together: Not on file    Attends religious service: Not on file    Active member of club or organization: Not on file    Attends meetings of clubs or organizations: Not on file    Relationship status: Not on file  Other Topics Concern  . Not on file  Social History Narrative  . Not on file     Family History: The patient's family history includes Breast cancer in her paternal grandmother; Cancer in her father; Cancer (age of onset: 94) in her maternal uncle; Diabetes in her maternal grandfather; Hyperlipidemia in her father; Hypertension in her father and mother; Osteoarthritis in her father; Osteoporosis in her mother; Parkinson's disease in her father; Rheum arthritis in her father. ROS:   Please see the history of present illness.    All other systems reviewed and are negative.  EKGs/Labs/Other Studies Reviewed:    The following studies were reviewed today:    Holter with frequent APC's, burden 13.6% and 10 runs of APC's longest 30 secs rates 110-155 BPM  Echo normal except for mild LAE  Recent Labs: No results found for requested labs within last 8760 hours.  Recent Lipid Panel    Component Value Date/Time   CHOL 240 (H) 03/18/2017 1506   TRIG 66 03/18/2017 1506   HDL 133 03/18/2017 1506   CHOLHDL 1.8 03/18/2017 1506   VLDL 13 03/18/2017 1506   LDLCALC 94 03/18/2017 1506    Physical Exam:    VS:  BP 130/80   Pulse 78   Ht 5\' 8"  (1.727 m)   Wt 164 lb 12.8 oz (74.8 kg)   SpO2 96%   BMI 25.06 kg/m     Wt Readings from Last 3 Encounters:  06/16/18 164 lb 12.8 oz (74.8 kg)  04/06/18 162 lb (73.5 kg)  03/29/18 163 lb (73.9 kg)     GEN:  Well nourished, well developed in no acute distress HEENT: Normal NECK: No JVD; No carotid bruits LYMPHATICS: No lymphadenopathy CARDIAC: RRR, no murmurs, rubs, gallops RESPIRATORY:  Clear to auscultation without rales,  wheezing or rhonchi  ABDOMEN: Soft, non-tender, non-distended MUSCULOSKELETAL:  No edema; No deformity  SKIN: Warm and dry NEUROLOGIC:  Alert and oriented x 3 PSYCHIATRIC:  Normal affect    Signed, Shirlee More, MD  06/16/2018 3:27 PM    [facility]

## 2018-06-24 ENCOUNTER — Telehealth: Payer: Self-pay | Admitting: Obstetrics and Gynecology

## 2018-06-24 NOTE — Telephone Encounter (Signed)
Left message for patient to reschedule appointment.

## 2018-07-12 ENCOUNTER — Other Ambulatory Visit (INDEPENDENT_AMBULATORY_CARE_PROVIDER_SITE_OTHER): Payer: 59

## 2018-07-12 DIAGNOSIS — R7989 Other specified abnormal findings of blood chemistry: Secondary | ICD-10-CM

## 2018-07-13 ENCOUNTER — Encounter: Payer: Self-pay | Admitting: Obstetrics and Gynecology

## 2018-07-13 LAB — VITAMIN D 25 HYDROXY (VIT D DEFICIENCY, FRACTURES): VIT D 25 HYDROXY: 22.1 ng/mL — AB (ref 30.0–100.0)

## 2018-10-11 NOTE — Progress Notes (Signed)
GYNECOLOGY  VISIT   HPI: 57 y.o.   Married  Caucasian  female   G0P0000 with No LMP recorded. Patient has had an ablation.   here for tender area on her rectum that hurts with having a BM this has been going on for a month and half.  States she is having a growth around her rectum.  There has been some bleeding with wiping once a week.   Reporting left sided abdominal pain, bloating, constipation and diarrhea.  Has a pinching like poking pain.   Can be an 8/10. Can wake her up from sleep.  Able to work and eat.  Passing flatus ok.  No nausea or vomiting.  No fever.   Hx collagenous colitis and colon cancer.  Sees Dr. Paulita Fujita for her GI care.   She was seen for lower abdominal cramping pain and lower back pain in May, and was instructed to follow up with her GI. She had a normal pelvic US pror to this.  She states was instructed by Dr. Paulita Fujita to take IB guard, which did not work for a prolonged period of time.  She also reports bruising and bleeding.  She scratched her face and bled more than she expected.  She also but her finger in the kitchen and bled for 1.5 hours.   GYNECOLOGIC HISTORY: No LMP recorded. Patient has had an ablation. Contraception:  Post menopausal Menopausal hormone therapy: None Last mammogram:  06/21/2018 Birads 1 negative Last pap smear:   03/19/2018 WNL        OB History    Gravida  0   Para  0   Term  0   Preterm  0   AB  0   Living  0     SAB  0   TAB  0   Ectopic  0   Multiple  0   Live Births  0              Patient Active Problem List   Diagnosis Date Noted  . Malignant tumor of colon ([place]) 04/01/2018  . Perimenopausal disorder 04/01/2018  . Uterine leiomyoma 04/01/2018  . Palpitations 04/01/2018  . Irregular heart beat 04/01/2018  . Fatigue 01/25/2018  . Ulcerative colitis ([place]) 01/25/2018  . Vitamin D deficiency 01/25/2018  . Shortness of breath on exertion 06/08/2017  . Knee pain, right 08/22/2015  . Cervical  radiculopathy 01/10/2015  . Anxiety 09/12/2014  . 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  . 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 Neck pain 09/12/2014  . Osteopenia 09/12/2014    Past Medical History:  Diagnosis Date  . Abnormal uterine bleeding    when she had fibroid tumor  . Anxiety   . 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   Overview:  Clinically Left C4 Clinically Left C4  . Cancer [facility]) 2005   colon   . Cervical radiculopathy 01/10/2015  . Fatigue 01/25/2018  . Fibroid   . Irregular heart beat 04/01/2018  . Knee pain, right 08/22/2015  . 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 Malignant tumor of colon ([place]) 04/01/2018  . Neck pain 09/12/2014  . Osteopenia 2018   hips and spine  . Palpitations 04/01/2018  . Perimenopausal disorder 04/01/2018  . Seasonal allergies   . Shortness of breath on exertion 06/08/2017  . Ulcerative colitis ([place])   . Uterine leiomyoma 04/01/2018  . Vitamin D deficiency     Past Surgical History:  Procedure Laterality Date  .  COLON SURGERY  2005   colon cancer--Dr.Rosenbower  . ENDOMETRIAL ABLATION  2012   Dr.Lomax  . MYOMECTOMY  2012   Dr.Lomax    Current Outpatient Medications  Medication Sig Dispense Refill  . albuterol (PROVENTIL HFA;VENTOLIN HFA) 108 (90 Base) MCG/ACT inhaler Inhale into the lungs as needed.    . ALPRAZolam (XANAX) 0.5 MG tablet Take 1 tablet by mouth as needed.    Marland Kitchen Peppermint Oil (IBGARD PO) Take by mouth.    . sertraline (ZOLOFT) 100 MG tablet Take 1 tablet by mouth daily.     . Vitamin D, Ergocalciferol, (DRISDOL) 50000 units CAPS capsule Take 1 capsule (50,000 Units total) by mouth every 14 (fourteen) days. 6 capsule 3   No current facility-administered medications for this visit.      ALLERGIES: Patient has no known allergies.  Family History  Problem Relation Age of Onset  . Osteoporosis Mother   . Hypertension Mother   . Cancer Father        prostate  .  Parkinson's disease Father        Dec age 67 from parkinsons/Lewey Body Dementia?  Marland Kitchen Hypertension Father   . Hyperlipidemia Father   . Osteoarthritis Father   . Rheum arthritis Father   . Cancer Maternal Uncle 45       colon cancer  . Diabetes Maternal Grandfather   . Breast cancer Paternal Grandmother     Social History   Socioeconomic History  . Marital status: Married    Spouse name: Not on file  . Number of children: Not on file  . Years of education: Not on file  . Highest education level: Not on file  Occupational History  . Not on file  Social Needs  . Financial resource strain: Not on file  . Food insecurity:    Worry: Not on file    Inability: Not on file  . Transportation needs:    Medical: Not on file    Non-medical: Not on file  Tobacco Use  . Smoking status: Former Smoker    Types: Cigarettes    Last attempt to quit: 11/18/2011    Years since quitting: 6.9  . Smokeless tobacco: Never Used  Substance and Sexual Activity  . Alcohol use: Yes  . Drug use: No  . Sexual activity: Yes    Partners: Male    Birth control/protection: Post-menopausal    Comment: Ablation 2012  Lifestyle  . Physical activity:    Days per week: Not on file    Minutes per session: Not on file  . Stress: Not on file  Relationships  . Social connections:    Talks on phone: Not on file    Gets together: Not on file    Attends religious service: Not on file    Active member of club or organization: Not on file    Attends meetings of clubs or organizations: Not on file    Relationship status: Not on file  . Intimate partner violence:    Fear of current or ex partner: Not on file    Emotionally abused: Not on file    Physically abused: Not on file    Forced sexual activity: Not on file  Other Topics Concern  . Not on file  Social History Narrative  . Not on file    Review of Systems  Constitutional: Negative.   HENT: Negative.   Eyes: Negative.   Respiratory: Negative.    Cardiovascular: Negative.   Gastrointestinal: Positive for  abdominal distention, abdominal pain, constipation and diarrhea.  Endocrine: Negative.   Genitourinary: Negative.   Musculoskeletal: Negative.   Skin: Negative.   Allergic/Immunologic: Negative.   Neurological: Negative.   Hematological: Bruises/bleeds easily.  Psychiatric/Behavioral: Negative.     PHYSICAL EXAMINATION:    BP 132/84 (BP Location: Right Arm, Patient Position: Sitting, Cuff Size: Normal)   Pulse 76   Ht 5\' 8"  (1.727 m)   Wt 165 lb 9.6 oz (75.1 kg)   BMI 25.18 kg/m     General appearance: alert, cooperative and appears stated age Head: Normocephalic, without obvious abnormality, atraumatic Neck: no adenopathy, supple, symmetrical, trachea midline and thyroid normal to inspection and palpation Lungs: clear to auscultation bilaterally Breasts: normal appearance, no masses or tenderness, No nipple retraction or dimpling, No nipple discharge or bleeding, No axillary or supraclavicular adenopathy Heart: regular rate and rhythm Abdomen: soft, non-tender, no masses,  no organomegaly Extremities: extremities normal, atraumatic, no cyanosis or edema No abnormal inguinal nodes palpated Neurologic: Grossly normal  Pelvic: External genitalia:  no lesions              Urethra:  normal appearing urethra with no masses, tenderness or lesions              Bartholins and Skenes: normal                 Vagina: normal appearing vagina with normal color and discharge, no lesions              Cervix: no lesions                Bimanual Exam:  Uterus:  normal size, contour, position, consistency, mobility, non-tender              Adnexa: no mass, fullness, tenderness              Rectal exam: Yes.  .  Confirms.              Anus:  normal sphincter tone, hemorrhoid with fissure noted.   Chaperone was present for exam.  ASSESSMENT  Rectal pain and bleeding Hemorrhoid noted.  Personal hx colon cancer and collagenous  colitis. LLQ pain.  No acute abdomen.   Bleeding tendency.   PLAN  Proctofoam.  Pt, PTT, CBC, CMP.  CT of abdomin  and pelvis.  Referral back to Dr. Paulita Fujita.   An After Visit Summary was printed and given to the patient.  __25____ minutes face to face time of which over 50% was spent in counseling.

## 2018-10-12 ENCOUNTER — Encounter: Payer: Self-pay | Admitting: Obstetrics and Gynecology

## 2018-10-12 ENCOUNTER — Other Ambulatory Visit: Payer: Self-pay

## 2018-10-12 ENCOUNTER — Ambulatory Visit: Payer: 59 | Admitting: Obstetrics and Gynecology

## 2018-10-12 VITALS — BP 132/84 | HR 76 | Ht 68.0 in | Wt 165.6 lb

## 2018-10-12 DIAGNOSIS — K6289 Other specified diseases of anus and rectum: Secondary | ICD-10-CM | POA: Diagnosis not present

## 2018-10-12 DIAGNOSIS — K649 Unspecified hemorrhoids: Secondary | ICD-10-CM | POA: Diagnosis not present

## 2018-10-12 DIAGNOSIS — R1032 Left lower quadrant pain: Secondary | ICD-10-CM | POA: Diagnosis not present

## 2018-10-12 DIAGNOSIS — D699 Hemorrhagic condition, unspecified: Secondary | ICD-10-CM

## 2018-10-12 MED ORDER — HYDROCORTISONE ACE-PRAMOXINE 1-1 % RE FOAM: 1.0000 | Freq: Two times a day (BID) | RECTAL | 0 refills | Status: DC

## 2018-10-12 NOTE — Progress Notes (Signed)
Patient scheduled while in office for CT Abdomen/pelvis with contrast at [place], [place] location, on 11/27 at 12:30pm, arriving at 12:10pm. No solid foof 4 hrs prior, may drink liquids and take medications. Instructed patient to pick up oral contrast from either [place] location today. Patient verbalizes understanding and is agreeable.   Scheduled patient with Dr. Paulita Fujita at [place] on 10/18/18 at 1:30pm. Patient verbalizes understanding and is agreeable.

## 2018-10-13 ENCOUNTER — Ambulatory Visit
Admission: RE | Admit: 2018-10-13 | Discharge: 2018-10-13 | Disposition: A | Discharge status: Home / Self Care | Payer: 59 | Source: Ambulatory Visit | Attending: Obstetrics and Gynecology | Admitting: Obstetrics and Gynecology

## 2018-10-13 DIAGNOSIS — R1032 Left lower quadrant pain: Secondary | ICD-10-CM

## 2018-10-13 LAB — COMPREHENSIVE METABOLIC PANEL
ALBUMIN: 4.8 g/dL (ref 3.5–5.5)
ALT: 18 IU/L (ref 0–32)
AST: 22 IU/L (ref 0–40)
Albumin/Globulin Ratio: 2.1 (ref 1.2–2.2)
Alkaline Phosphatase: 107 IU/L (ref 39–117)
BUN / CREAT RATIO: 19 (ref 9–23)
BUN: 10 mg/dL (ref 6–24)
Bilirubin Total: 0.3 mg/dL (ref 0.0–1.2)
CO2: 21 mmol/L (ref 20–29)
CREATININE: 0.53 mg/dL — AB (ref 0.57–1.00)
Calcium: 9.8 mg/dL (ref 8.7–10.2)
Chloride: 101 mmol/L (ref 96–106)
GFR, EST AFRICAN AMERICAN: 124 mL/min/{1.73_m2} (ref >59)
GFR, EST NON AFRICAN AMERICAN: 107 mL/min/{1.73_m2} (ref >59)
Globulin, Total: 2.3 g/dL (ref 1.5–4.5)
Glucose: 93 mg/dL (ref 65–99)
Potassium: 4.4 mmol/L (ref 3.5–5.2)
Sodium: 140 mmol/L (ref 134–144)
TOTAL PROTEIN: 7.1 g/dL (ref 6.0–8.5)

## 2018-10-13 LAB — CBC
HEMATOCRIT: 39.4 % (ref 34.0–46.6)
HEMOGLOBIN: 13.7 g/dL (ref 11.1–15.9)
MCH: 31.1 pg (ref 26.6–33.0)
MCHC: 34.8 g/dL (ref 31.5–35.7)
MCV: 90 fL (ref 79–97)
Platelets: 248 10*3/uL (ref 150–450)
RBC: 4.4 x10E6/uL (ref 3.77–5.28)
RDW: 12.5 % (ref 12.3–15.4)
WBC: 6 10*3/uL (ref 3.4–10.8)

## 2018-10-13 LAB — PROTIME-INR
INR: 1 (ref 0.8–1.2)
Prothrombin Time: 11 s (ref 9.1–12.0)

## 2018-10-13 LAB — APTT: aPTT: 29 s (ref 24–33)

## 2018-10-13 MED ORDER — IOPAMIDOL (ISOVUE-300) INJECTION 61%
100.0000 mL | Freq: Once | INTRAVENOUS | Status: AC | PRN
  Administered 2018-10-13: 100 mL via INTRAVENOUS

## 2019-01-05 ENCOUNTER — Encounter: Payer: Self-pay | Admitting: Family Medicine

## 2019-01-05 ENCOUNTER — Ambulatory Visit: Payer: 59 | Admitting: Family Medicine

## 2019-01-05 ENCOUNTER — Telehealth: Payer: Self-pay | Admitting: Family Medicine

## 2019-01-05 ENCOUNTER — Ambulatory Visit (INDEPENDENT_AMBULATORY_CARE_PROVIDER_SITE_OTHER): Payer: 59

## 2019-01-05 ENCOUNTER — Telehealth: Payer: Self-pay

## 2019-01-05 VITALS — BP 140/84 | HR 99 | Temp 97.4°F | Ht 68.0 in | Wt 166.2 lb

## 2019-01-05 DIAGNOSIS — R05 Cough: Secondary | ICD-10-CM | POA: Diagnosis not present

## 2019-01-05 DIAGNOSIS — R059 Cough, unspecified: Secondary | ICD-10-CM

## 2019-01-05 MED ORDER — AZITHROMYCIN 250 MG PO TABS: ORAL_TABLET | ORAL | 0 refills | Status: DC

## 2019-01-05 MED ORDER — BENZONATATE 200 MG PO CAPS: 200.0000 mg | ORAL_CAPSULE | Freq: Three times a day (TID) | ORAL | 0 refills | Status: DC | PRN

## 2019-01-05 MED ORDER — ALBUTEROL SULFATE HFA 108 (90 BASE) MCG/ACT IN AERS: 1.0000 | INHALATION_SPRAY | RESPIRATORY_TRACT | 3 refills | Status: DC | PRN

## 2019-01-05 NOTE — Telephone Encounter (Signed)
See note

## 2019-01-05 NOTE — Telephone Encounter (Signed)
Erroneous encounter

## 2019-01-05 NOTE — Patient Instructions (Signed)
Acute Bronchitis, Adult Acute bronchitis is when air tubes (bronchi) in the lungs suddenly get swollen. The condition can make it hard to breathe. It can also cause these symptoms:  A cough.  Coughing up clear, yellow, or green mucus.  Wheezing.  Chest congestion.  Shortness of breath.  A fever.  Body aches.  Chills.  A sore throat. Follow these instructions at home:  Medicines  Take over-the-counter and prescription medicines only as told by your doctor.  If you were prescribed an antibiotic medicine, take it as told by your doctor. Do not stop taking the antibiotic even if you start to feel better. General instructions  Rest.  Drink enough fluids to keep your pee (urine) pale yellow.  Avoid smoking and secondhand smoke. If you smoke and you need help quitting, ask your doctor. Quitting will help your lungs heal faster.  Use an inhaler, cool mist vaporizer, or humidifier as told by your doctor.  Keep all follow-up visits as told by your doctor. This is important. How is this prevented? To lower your risk of getting this condition again:  Wash your hands often with soap and water. If you cannot use soap and water, use hand sanitizer.  Avoid contact with people who have cold symptoms.  Try not to touch your hands to your mouth, nose, or eyes.  Make sure to get the flu shot every year. Contact a doctor if:  Your symptoms do not get better in 2 weeks. Get help right away if:  You cough up blood.  You have chest pain.  You have very bad shortness of breath.  You become dehydrated.  You faint (pass out) or keep feeling like you are going to pass out.  You keep throwing up (vomiting).  You have a very bad headache.  Your fever or chills gets worse. This information is not intended to replace advice given to you by your health care provider. Make sure you discuss any questions you have with your health care provider. Document Released: 04/21/2008 Document  Revised: 06/17/2017 Document Reviewed: 04/23/2016 Elsevier Interactive Patient Education  2019 Elsevier Inc.  

## 2019-01-05 NOTE — Telephone Encounter (Signed)
Called and spoke to April at [facility] and clarified dosage of Albuterol Inhaler.  1-2 puffs into the lungs q 4-6 hrs prn for wheezing or SOB.  April verbalized understanding.

## 2019-01-05 NOTE — Telephone Encounter (Signed)
Copied from [place] [id number]. Topic: Quick Communication - See Telephone Encounter >> Jan 05, 2019  2:23 PM Vernona Rieger wrote: CRM for notification. See Telephone encounter for: 01/05/19. April with [facility] needs clarification on how many hours in between she can use the albuterol (PROVENTIL HFA;VENTOLIN HFA) 108 (90 Base) MCG/ACT inhaler 1 Inhaler     Sig - Route: Inhale 1-2 puffs into the lungs as needed for wheezing or shortness of breath.

## 2019-01-05 NOTE — Telephone Encounter (Signed)
Sorry! Prn every 4-6 hours.

## 2019-01-05 NOTE — Progress Notes (Signed)
Patient: Courtney Watson MRN: [id number] DOB: July 12, 1961 PCP: Orma Flaming, MD     Subjective:  Chief Complaint  Patient presents with  . Cough    HPI: The patient is a 58 y.o. female who presents today for establishing care and cough. She states she started to feel bad at the beginning of the January. She has been coughing x 6+ weeks. She does vape. She has been vaping x 7 years. She has a right sided sinus headache and was really congested, but this has improved. She feels like this has moved down into her chest. She has production in the AM.  She has only taken ibuprofen for the headache only. She isn't short of breath, does hear a wheeze at times. Her whole office has been sick, her husband, but they all got well and she is still coughing. She did have fevers over 3 weeks ago, but this has resolved.   Review of Systems  Constitutional: Positive for chills and fatigue. Negative for fever.  HENT: Positive for congestion, postnasal drip, rhinorrhea, sinus pressure and sinus pain. Negative for ear pain and sore throat.   Eyes: Negative for pain.  Respiratory: Positive for cough.   Cardiovascular: Negative for chest pain.       C/o chest soreness and ribs hurting from excessive coughing  Gastrointestinal: Negative for abdominal pain and nausea.  Musculoskeletal: Positive for myalgias. Negative for back pain and neck pain.       C/o feeling a little achey.  Neurological: Positive for headaches. Negative for dizziness.  Psychiatric/Behavioral: Negative for sleep disturbance.    Allergies Patient has No Known Allergies.  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  Surgical History Patient  has a past surgical history that includes Colon surgery (2005); Endometrial ablation (2012); and Myomectomy (2012).  Family History Pateint's family history includes Breast cancer in her paternal grandmother; Cancer in her father; Cancer (age of onset: 95) in her maternal uncle; Diabetes in her maternal grandfather; Hyperlipidemia in her father; Hypertension in her father and mother; Osteoarthritis in her father; Osteoporosis in her mother; Parkinson's disease in her father; Rheum arthritis in her father.  Social History Patient  reports that she quit smoking about 7 years ago. Her smoking use included cigarettes. She has never used smokeless tobacco. She reports current alcohol use. She reports that she does not use drugs.    Objective: Vitals:   01/05/19 1309  BP: 140/84  Pulse: 99  Temp: (!) 97.4 F (36.3 C)  TempSrc: Oral  SpO2: 97%  Weight: 166 lb 3.2 oz (75.4 kg)  Height: 5\' 8"  (1.727 m)    Body mass index is 25.27 kg/m.  Physical Exam Vitals signs reviewed.  Constitutional:      Appearance: Normal appearance.  HENT:     Head: Normocephalic and atraumatic.     Right Ear: Tympanic membrane, ear canal and external ear normal.     Left Ear: Tympanic membrane, ear canal and external ear normal.     Nose: Nose normal. No congestion.     Comments: No ttp over sinuses  Neck:     Musculoskeletal: Normal range of motion and neck supple.  Cardiovascular:     Rate and Rhythm: Normal rate. Rhythm irregular.     Heart sounds: Normal heart sounds.  Pulmonary:     Effort: Pulmonary effort is normal. No respiratory distress.     Breath sounds: Normal breath sounds. No wheezing or  rales.  Abdominal:     General: Abdomen is flat. Bowel sounds are normal.     Palpations: Abdomen is soft.  Lymphadenopathy:     Cervical: No cervical adenopathy.  Skin:    Capillary Refill: Capillary refill takes less than 2 seconds.  Neurological:     General: No focal deficit present.     Mental Status: She is alert and oriented to person, place, and time.  Psychiatric:        Mood and Affect: Mood normal.        Behavior: Behavior normal.    CXR: bronchitis. No consolidation. Official read pending.     Assessment/plan: 1. Cough Appears to have bronchitis. Sounds like she had a sinus infection as well, but this cleared up. Discussed usually viral in nature, but with prolonged course and fevers and long hx of excessive vaping will treat with zpack, inhaler and tessalon pearls prn. If not better after 2 weeks, would consider CT of her chest with vaping history. She will let me know if not better. F/u in 1-2 months for her annual/labs. Requesting records as well from her previous pcp.   - DG Chest 2 View; Future - albuterol (PROVENTIL HFA;VENTOLIN HFA) 108 (90 Base) MCG/ACT inhaler; Inhale 1-2 puffs into the lungs as needed for wheezing or shortness of breath.  Dispense: 1 Inhaler; Refill: 3 - azithromycin (ZITHROMAX) 250 MG tablet; 2 pills today and then 1 pill days 2-5. Hold cholesterol drug while on this.  Dispense: 6 tablet; Refill: 0      Return in about 2 months (around 03/06/2019) for annual/labs .   Orma Flaming, MD [place]   01/05/2019

## 2019-01-11 ENCOUNTER — Telehealth: Payer: Self-pay | Admitting: Radiology

## 2019-01-11 NOTE — Telephone Encounter (Signed)
See Note  Copied from [place] [id number]. Topic: General - Other >> Jan 11, 2019  1:11 PM Sheran Luz wrote: Alvis Lemmings, with [facility], calling as Juluis Rainier that they received a medical records request from office- Alvis Lemmings states that they are on Epic so any requested information should be visible to provider already.

## 2019-01-11 NOTE — Telephone Encounter (Signed)
Noted. FYI 

## 2019-01-17 ENCOUNTER — Telehealth: Payer: Self-pay | Admitting: Family Medicine

## 2019-01-17 NOTE — Telephone Encounter (Signed)
See note

## 2019-01-17 NOTE — Telephone Encounter (Signed)
Please see msg and advise.  

## 2019-01-17 NOTE — Telephone Encounter (Signed)
Copied from [place] [id number]. Topic: Quick Communication - See Telephone Encounter >> Jan 17, 2019 12:58 PM Blase Mess A wrote: CRM for notification. See Telephone encounter for: 01/17/19.   Patient is calling because she has completed the azithromycin (ZITHROMAX) 250 MG tablet [[id number]] and still does not feel better. What to know if Dr. Rogers Blocker can call her something else in. Please advise. Thank you

## 2019-01-17 NOTE — Telephone Encounter (Signed)
Called and spoke with patient and advised of notes per Dr. Rogers Blocker.  Patient verbalized understanding and states that she already has Tessalon perles on hand and prefers to use them in lieu of the cough syrup.  She will contact us back if she develops a fever or worsening symptoms.

## 2019-01-17 NOTE — Telephone Encounter (Signed)
Let her know that her CXR was clear and I do thinks he has bronchitis. This can last up to 6 weeks of coughing. We have already treated her with antibiotic that she likely didn't need based off clear CXR. I can do codeine cough syrup to help if this is something she would like, but adding on another antibiotic would likely not benefit unless fever/change in symptoms.

## 2019-02-08 ENCOUNTER — Ambulatory Visit: Payer: Self-pay | Admitting: Psychiatry

## 2019-02-10 ENCOUNTER — Other Ambulatory Visit: Payer: Self-pay

## 2019-02-10 ENCOUNTER — Ambulatory Visit (INDEPENDENT_AMBULATORY_CARE_PROVIDER_SITE_OTHER): Payer: 59 | Admitting: Psychiatry

## 2019-02-10 ENCOUNTER — Encounter: Payer: Self-pay | Admitting: Psychiatry

## 2019-02-10 DIAGNOSIS — F410 Panic disorder [episodic paroxysmal anxiety] without agoraphobia: Secondary | ICD-10-CM

## 2019-02-10 DIAGNOSIS — F40233 Fear of injury: Secondary | ICD-10-CM | POA: Diagnosis not present

## 2019-02-10 MED ORDER — SERTRALINE HCL 100 MG PO TABS: 100.0000 mg | ORAL_TABLET | Freq: Every day | ORAL | 3 refills | Status: DC

## 2019-02-10 MED ORDER — ALPRAZOLAM 0.5 MG PO TABS: 0.5000 mg | ORAL_TABLET | Freq: Three times a day (TID) | ORAL | 1 refills | Status: DC | PRN

## 2019-02-10 NOTE — Progress Notes (Signed)
Courtney Watson [id number] 1961/01/30 58 y.o.  Subjective:   Patient ID:  Courtney Watson is a 58 y.o. (DOB 1960-12-08) female.  Chief Complaint:  Chief Complaint  Patient presents with  . Anxiety    HPI Courtney Watson presents to the office today for follow-up of anxiety.  Last seen March 2019.    Really good with anxiety.  Couple occassions able to eat without Xanax.   .  No sig panic attacks lately.  Can trigger herself to feel throat close up if talks.  Review of Systems:  Review of Systems  Neurological: Negative for tremors and weakness.    Medications: I have reviewed the patient's current medications.  Current Outpatient Medications  Medication Sig Dispense Refill  . albuterol (PROVENTIL HFA;VENTOLIN HFA) 108 (90 Base) MCG/ACT inhaler Inhale 1-2 puffs into the lungs as needed for wheezing or shortness of breath. 1 Inhaler 3  . ALPRAZolam (XANAX) 0.5 MG tablet Take 1 tablet by mouth 3 (three) times daily as needed.     . sertraline (ZOLOFT) 100 MG tablet Take 1 tablet by mouth daily.     . Vitamin D, Ergocalciferol, (DRISDOL) 50000 units CAPS capsule Take 1 capsule (50,000 Units total) by mouth every 14 (fourteen) days. 6 capsule 3  . benzonatate (TESSALON) 200 MG capsule Take 1 capsule (200 mg total) by mouth 3 (three) times daily as needed. (Patient not taking: Reported on 02/10/2019) 30 capsule 0   No current facility-administered medications for this visit.     Medication Side Effects: None  Allergies: No Known Allergies  Past Medical History:  Diagnosis Date  . Abnormal uterine bleeding    when she had fibroid tumor  . Anxiety   . Blood in stool   . 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   Overview:  Clinically Left C4 Clinically Left C4  . Cancer [facility]) 2005   colon   . Cervical radiculopathy 01/10/2015  . Colon polyps   . Eating disorder   . Fatigue 01/25/2018  . Fibroid   . H/O rhinoplasty   . History of chicken pox   . Irregular heart beat  04/01/2018  . Knee pain, right 08/22/2015  . Malignant neoplasm of large intestine ([place]) 09/12/2014  . Malignant tumor of colon ([place]) 04/01/2018  . Neck pain 09/12/2014  . Osteopenia 2018   hips and spine  . Palpitations 04/01/2018  . Perimenopausal disorder 04/01/2018  . Seasonal allergies   . Shortness of breath on exertion 06/08/2017  . Ulcerative colitis ([place])   . Uterine leiomyoma 04/01/2018  . Vitamin D deficiency     Family History  Problem Relation Age of Onset  . Osteoporosis Mother   . Hypertension Mother   . Arthritis Mother   . Hearing loss Mother   . Cancer Father        prostate  . Parkinson's disease Father        Dec age 30 from parkinsons/Lewey Body Dementia?  Marland Kitchen Hypertension Father   . Hyperlipidemia Father   . Osteoarthritis Father   . Rheum arthritis Father   . Cancer Maternal Uncle 45       colon cancer  . Depression Maternal Grandmother   . Diabetes Maternal Grandfather   . Heart disease Maternal Grandfather   . Hypertension Maternal Grandfather   . Breast cancer Paternal Grandmother   . Cancer Paternal Grandfather     Social History   Socioeconomic History  . Marital status: Married    Spouse name: Not on file  .  Number of children: Not on file  . Years of education: Not on file  . Highest education level: Not on file  Occupational History  . Not on file  Social Needs  . Financial resource strain: Not on file  . Food insecurity:    Worry: Not on file    Inability: Not on file  . Transportation needs:    Medical: Not on file    Non-medical: Not on file  Tobacco Use  . Smoking status: Former Smoker    Types: Cigarettes    Last attempt to quit: 11/18/2011    Years since quitting: 7.2  . Smokeless tobacco: Never Used  Substance and Sexual Activity  . Alcohol use: Yes  . Drug use: No  . Sexual activity: Yes    Partners: Male    Birth control/protection: Post-menopausal    Comment: Ablation 2012  Lifestyle  . Physical activity:    Days  per week: Not on file    Minutes per session: Not on file  . Stress: Not on file  Relationships  . Social connections:    Talks on phone: Not on file    Gets together: Not on file    Attends religious service: Not on file    Active member of club or organization: Not on file    Attends meetings of clubs or organizations: Not on file    Relationship status: Not on file  . Intimate partner violence:    Fear of current or ex partner: Not on file    Emotionally abused: Not on file    Physically abused: Not on file    Forced sexual activity: Not on file  Other Topics Concern  . Not on file  Social History Narrative  . Not on file    Past Medical History, Surgical history, Social history, and Family history were reviewed and updated as appropriate.   Please see review of systems for further details on the patient's review from today.   Objective:   Physical Exam:  There were no vitals taken for this visit.  Physical Exam Constitutional:      General: She is not in acute distress.    Appearance: She is well-developed.  Neurological:     Mental Status: She is alert and oriented to person, place, and time.     Cranial Nerves: No dysarthria.  Psychiatric:        Attention and Perception: She is attentive. She does not perceive auditory or visual hallucinations.        Mood and Affect: Mood is anxious. Mood is not depressed. Affect is not labile, blunt, angry or inappropriate.        Speech: Speech normal.        Behavior: Behavior normal.        Thought Content: Thought content normal. Thought content does not include homicidal or suicidal ideation. Thought content does not include homicidal or suicidal plan.        Cognition and Memory: Cognition normal.        Judgment: Judgment normal.     Comments: Insight intact. No auditory or visual hallucinations. No delusions.  She still has some anxiety around eating and requires the Xanax but it is manageable and much less severe than  in the past.     Lab Review:     Component Value Date/Time   NA 140 10/12/2018 1007   K 4.4 10/12/2018 1007   CL 101 10/12/2018 1007   CO2 21 10/12/2018 1007  GLUCOSE 93 10/12/2018 1007   GLUCOSE 83 03/18/2017 1506   BUN 10 10/12/2018 1007   CREATININE 0.53 (L) 10/12/2018 1007   CREATININE 0.59 03/18/2017 1506   CALCIUM 9.8 10/12/2018 1007   PROT 7.1 10/12/2018 1007   ALBUMIN 4.8 10/12/2018 1007   AST 22 10/12/2018 1007   ALT 18 10/12/2018 1007   ALKPHOS 107 10/12/2018 1007   BILITOT 0.3 10/12/2018 1007   GFRNONAA 107 10/12/2018 1007   GFRAA 124 10/12/2018 1007       Component Value Date/Time   WBC 6.0 10/12/2018 1007   WBC 6.4 03/18/2017 1506   RBC 4.40 10/12/2018 1007   RBC 4.55 03/18/2017 1506   HGB 13.7 10/12/2018 1007   HCT 39.4 10/12/2018 1007   PLT 248 10/12/2018 1007   MCV 90 10/12/2018 1007   MCH 31.1 10/12/2018 1007   MCH 31.2 03/18/2017 1506   MCHC 34.8 10/12/2018 1007   MCHC 33.3 03/18/2017 1506   RDW 12.5 10/12/2018 1007    No results found for: POCLITH, LITHIUM   No results found for: PHENYTOIN, PHENOBARB, VALPROATE, CBMZ   .res Assessment: Plan:    Fear of choking  Panic disorder without agoraphobia   Courtney Watson has a history of panic disorder and a fear of choking.  It has been extreme and caused a great deal of avoidance in the past she would avoid eating out.  Now she can eat out although she does have to take half of 0.5 mg Xanax in order to do so.  She gets overall antipanic effects from the sertraline.  No med changes today  Follow-up 1 year  We discussed the short-term risks associated with benzodiazepines including sedation and increased fall risk among others.  Discussed long-term side effect risk including dependence, potential withdrawal symptoms, and the potential eventual dose-related risk of dementia.  I connected with patient by a video enabled telemedicine application or telephone, with their informed consent, and verified  patient privacy and that I am speaking with the correct person using two identifiers.  I was located at office and patient at work.  Lynder Parents, MD, DFAPA   Please see After Visit Summary for patient specific instructions.  Future Appointments  Date Time Provider [place]  03/23/2019  2:30 PM Yisroel Ramming, Everardo All, MD [place] None    No orders of the defined types were placed in this encounter.     -------------------------------

## 2019-02-16 ENCOUNTER — Telehealth: Payer: Self-pay | Admitting: Family Medicine

## 2019-02-16 ENCOUNTER — Other Ambulatory Visit: Payer: Self-pay | Admitting: Family Medicine

## 2019-02-16 DIAGNOSIS — R05 Cough: Secondary | ICD-10-CM

## 2019-02-16 DIAGNOSIS — R053 Chronic cough: Secondary | ICD-10-CM

## 2019-02-16 NOTE — Telephone Encounter (Signed)
Next step would be pulm referral. She has had negative CXR and there is not much more I can prescribe. I put in referral for her.Marland KitchenMarland Kitchen

## 2019-02-16 NOTE — Telephone Encounter (Signed)
Spoke to patient and advised that pulmonology referral has been placed and they will reach out to patient to schedule appt.  She verbalized understanding.

## 2019-02-16 NOTE — Telephone Encounter (Signed)
Copied from [place] [id number]. Topic: General - Other >> Feb 15, 2019 10:00 PM Percell Belt A wrote: Reason for CRM: Pt called in and left a VM on the general mailbox-  she stated she still is having a really back cough.  She stated the cough med is not working.  She stated that this cough has been going on since jan.  She would like to know if something else could be call in or what she should do?

## 2019-02-24 ENCOUNTER — Telehealth: Payer: Self-pay | Admitting: Family Medicine

## 2019-02-24 NOTE — Telephone Encounter (Signed)
Copied from [place] [id number]. Topic: Referral - Status >> Feb 23, 2019  4:50 PM Yvette Rack wrote: Reason for CRM: Pt stated that she has not heard from the pulmonologist regarding scheduling her an appt. Cb# [phone]

## 2019-02-24 NOTE — Telephone Encounter (Signed)
Called patient and gave her the number to Pulmonary.  I apologized for the delay, she voiced understanding that offices are are trying to keep patients at home right now because of COVID.  She will call and set up appt.

## 2019-03-23 ENCOUNTER — Ambulatory Visit: Payer: 59 | Admitting: Obstetrics and Gynecology

## 2019-04-07 ENCOUNTER — Encounter: Payer: Self-pay | Admitting: Internal Medicine

## 2019-04-07 ENCOUNTER — Ambulatory Visit: Payer: 59 | Admitting: Internal Medicine

## 2019-04-07 ENCOUNTER — Other Ambulatory Visit: Payer: Self-pay

## 2019-04-07 VITALS — BP 130/72 | HR 104 | Temp 98.4°F | Ht 68.5 in | Wt 165.0 lb

## 2019-04-07 DIAGNOSIS — R05 Cough: Secondary | ICD-10-CM

## 2019-04-07 DIAGNOSIS — R058 Other specified cough: Secondary | ICD-10-CM | POA: Insufficient documentation

## 2019-04-07 MED ORDER — PANTOPRAZOLE SODIUM 40 MG PO TBEC: 40.0000 mg | DELAYED_RELEASE_TABLET | Freq: Every day | ORAL | 2 refills | Status: DC

## 2019-04-07 MED ORDER — ACETAMINOPHEN-CODEINE #3 300-30 MG PO TABS: 1.0000 | ORAL_TABLET | ORAL | 0 refills | Status: AC | PRN

## 2019-04-07 MED ORDER — PREDNISONE 10 MG PO TABS: ORAL_TABLET | ORAL | 0 refills | Status: DC

## 2019-04-07 MED ORDER — FAMOTIDINE 20 MG PO TABS: ORAL_TABLET | ORAL | 11 refills | Status: DC

## 2019-04-07 MED ORDER — ACETAMINOPHEN-CODEINE #3 300-30 MG PO TABS: 1.0000 | ORAL_TABLET | ORAL | 0 refills | Status: DC | PRN

## 2019-04-07 NOTE — Patient Instructions (Signed)
The key to effective treatment for your cough is eliminating the non-stop cycle of cough you're stuck in long enough to let your airway heal completely and then see if there is anything still making you cough once you stop the cough suppression, but this should take no more than 5 days to figure out  Take delsym two tsp every 12 hours and supplement if needed with  Tylenol #3   up to 1/2 - 1  every 4 hours to suppress the urge to cough. Swallowing water and/or using ice chips/non mint and menthol containing candies (such as lifesavers or sugarless jolly ranchers) are also effective.  You should rest your voice and avoid activities that you know make you cough.  Once you have eliminated the cough for 3 straight days try reducing the Tylenol #3 first,  then the delsym as tolerated.       Prednisone 10 mg take  4 each am x 2 days,   2 each am x 2 days,  1 each am x 2 days and stop (this is to eliminate allergies and inflammation from coughing)  Protonix (pantoprazole) Take 30-60 min before first meal of the day and Pepcid 20 mg after supper  for at least a week without the need for cough suppression  GERD (REFLUX)  is an extremely common cause of respiratory symptoms, many times with no significant heartburn at all.    It can be treated with medication, but also with lifestyle changes including avoidance of late meals, excessive alcohol, smoking cessation, and avoid fatty foods, chocolate, peppermint, colas, red wine, and acidic juices such as orange juice.  NO MINT OR MENTHOL PRODUCTS SO NO COUGH DROPS   USE HARD CANDY INSTEAD (jolley ranchers or Stover's or Lifesavers (all available in sugarless versions) NO OIL BASED VITAMINS - use powdered substitutes.  Call in a week if not problem

## 2019-04-07 NOTE — Assessment & Plan Note (Addendum)
Onset mid feb 2020 actively vaping in pt with UC  - cyclical cough rx 06/11/3663   The most common causes of chronic cough in immunocompetent adults include the following: upper airway cough syndrome (UACS), previously referred to as postnasal drip syndrome (PNDS), which is caused by variety of rhinosinus conditions; (2) asthma; (3) GERD; (4) chronic bronchitis from cigarette smoking or other inhaled environmental irritants; (5) nonasthmatic eosinophilic bronchitis; and (6) bronchiectasis assoc with Ulcerative colitis.   These conditions, singly or in combination, have accounted for up to 94% of the causes of chronic cough in prospective studies.   Other conditions have constituted no >6% of the causes in prospective studies These have included bronchogenic carcinoma, chronic interstitial pneumonia, sarcoidosis, left ventricular failure, ACEI-induced cough, and aspiration from a condition associated with pharyngeal dysfunction.    Chronic cough is often simultaneously caused by more than one condition. A single cause has been found from 38 to 82% of the time, multiple causes from 18 to 62%. Multiply caused cough has been the result of three diseases up to 42% of the time.   Absence of noct or early am cough or response to saba favors UACS here: Upper airway cough syndrome (previously labeled PNDS),  is so named because it's frequently impossible to sort out how much is  CR/sinusitis with freq throat clearing (which can be related to primary GERD)   vs  causing  secondary (" extra esophageal")  GERD from wide swings in gastric pressure that occur with throat clearing, often  promoting self use of mint and menthol lozenges that reduce the lower esophageal sphincter tone and exacerbate the problem further in a cyclical fashion.   These are the same pts (now being labeled as having "irritable larynx syndrome" by some cough centers) who not infrequently have a history of having failed to tolerate ace  inhibitors,  dry powder inhalers or biphosphonates or report having atypical/extraesophageal reflux symptoms that don't respond to standard doses of PPI  and are easily confused as having aecopd or asthma flares by even experienced allergists/ pulmonologists (myself included).      Of the three most common causes of  Sub-acute / recurrent or chronic cough, only one (GERD)  can actually contribute to/ trigger  the other two (asthma and post nasal drip syndrome)  and perpetuate the cylce of cough.  While not intuitively obvious, many patients with chronic low grade reflux do not cough until there is a primary insult that disturbs the protective epithelial barrier and exposes sensitive nerve endings.   This is typically viral but can due to PNDS and  With the former much more likely than the latter by hx .     >>> The point is that once this occurs, it is difficult to eliminate the cycle  using anything but a maximally effective acid suppression regimen at least in the short run, accompanied by an appropriate diet to address non acid GERD and control / eliminate the cough itself for at least 3 days with codeine while eliminating any Th-2 driven airways inflammation with short course of prednisone and cover the remote possibility of UC airway involvement which I doubt.   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   Total time devoted to counseling  > 50 % of initial 60 min office visit:  review case with pt/ discussion of options/alternatives/ personally creating written customized instructions  in presence  of pt  then going over those specific  Instructions directly with the pt including how to use all of the meds but in particular covering each new medication in detail and the difference between the maintenance= "automatic" meds and the prns using an action plan format for the latter (If this problem/symptom  => do that organization reading Left to right).  Please see AVS from this visit for a full list of these instructions which I personally wrote for this pt and  are unique to this visit.

## 2019-04-07 NOTE — Progress Notes (Signed)
Courtney Watson, female    DOB: [birth year],     MRN: [id number]   Brief patient profile:  33 yowf with UC quit smoking cigs 11/2011 with smoker's rattle that resolved despite continuing  vaping very low nicotine and no problems with ex tolerance then mid feb 2020 "really bad head cold migrated to chest just like husband" initially rx 01/05/19 with zpak/ tessalon/ inhaler ventolin all symptoms resolved x for the cough so referred to pulmonary clinic 04/07/2019 by Dr  Orma Flaming     History of Present Illness  04/07/2019  Pulmonary/ 1st office eval/Catrina Fellenz  Chief Complaint  Patient presents with  . Chronic Cough    Had bronchitis in February, coughing since that time.  Dyspnea:  No change in ex tol / no cough with ex  Cough: up to sev tbsp mucoid starting after bfast and continuing all day and wanes p supper and not hs  Sleep: fine flat  SABA use: none Had sinus surgery 1983 but denies any ongoing sinus complaints  Kouffman Reflux v Neurogenic Cough Differentiator Reflux Comments  Do you awaken from a sound sleep coughing violently?                            With trouble breathing? No No   Do you have choking episodes when you cannot  Get enough air, gasping for air ?              No    Do you usually cough when you lie down into  The bed, or when you just lie down to rest ?                          no   Do you usually cough after meals or eating?         No    Do you cough when (or after) you bend over?    No    GERD SCORE     Kouffman Reflux v Neurogenic Cough Differentiator Neurogenic   Do you more-or-less cough all day long? Sporadic    Does change of temperature make you cough? none   Does laughing or chuckling cause you to cough? laughing   Do fumes (perfume, automobile fumes, burned  Toast, etc.,) cause you to cough ?      none   Does speaking, singing, or talking on the phone cause you to cough   ?               Speaking better now    Neurogenic/Airway score       No  obvious other patterns in  day to day or daytime variability or assoc excess/ purulent sputum or mucus plugs or hemoptysis or cp or chest tightness, subjective wheeze or overt sinus or hb symptoms.   Sleeping fine flat  without nocturnal  or early am exacerbation  of respiratory  c/o's or need for noct saba. Also denies any obvious fluctuation of symptoms with weather or environmental changes or other aggravating or alleviating factors except as outlined above   No unusual exposure hx or h/o childhood pna/ asthma or knowledge of premature birth.  Current Allergies, Complete Past Medical History, Past Surgical History, Family History, and Social History were reviewed in [employer] record.  ROS  The following are not active complaints unless bolded Hoarseness, sore throat, dysphagia, dental problems, itching, sneezing,  nasal  congestion or discharge of excess mucus or purulent secretions, ear ache,   fever, chills, sweats, unintended wt loss or wt gain, classically pleuritic or exertional cp,  orthopnea pnd or arm/hand swelling  or leg swelling, presyncope, palpitations, abdominal pain, anorexia, nausea, vomiting, diarrhea  or change in bowel habits or change in bladder habits,(urinary leakage with cough) change in stools or change in urine, dysuria, hematuria,  rash, arthralgias, visual complaints, headache, numbness, weakness or ataxia or problems with walking or coordination,  change in mood or  memory.           Past Medical History:  Diagnosis Date  . Abnormal uterine bleeding    when she had fibroid tumor  . Anxiety   . Blood in stool   . 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   Overview:  Clinically Left C4 Clinically Left C4  . Cancer [facility]) 2005   colon   . Cervical radiculopathy 01/10/2015  . Colon polyps   . Eating disorder   . Fatigue 01/25/2018  . Fibroid   . H/O rhinoplasty   . History of chicken pox   . Irregular heart beat 04/01/2018  . Knee pain,  right 08/22/2015  . 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 Malignant tumor of colon ([place]) 04/01/2018  . Neck pain 09/12/2014  . Osteopenia 2018   hips and spine  . Palpitations 04/01/2018  . Perimenopausal disorder 04/01/2018  . Seasonal allergies   . Shortness of breath on exertion 06/08/2017  . Ulcerative colitis ([place])   . Uterine leiomyoma 04/01/2018  . Vitamin D deficiency     Outpatient Medications Prior to Visit  Medication Sig Dispense Refill  . albuterol (PROVENTIL HFA;VENTOLIN HFA) 108 (90 Base) MCG/ACT inhaler Inhale 1-2 puffs into the lungs as needed for wheezing or shortness of breath. 1 Inhaler 3  . ALPRAZolam (XANAX) 0.5 MG tablet Take 1 tablet (0.5 mg total) by mouth 3 (three) times daily as needed. 90 tablet 1  . benzonatate (TESSALON) 200 MG capsule Take 1 capsule (200 mg total) by mouth 3 (three) times daily as needed. (Patient not taking: Reported on 02/10/2019) 30 capsule 0  . sertraline (ZOLOFT) 100 MG tablet Take 1 tablet (100 mg total) by mouth daily. 90 tablet 3  . Vitamin D, Ergocalciferol, (DRISDOL) 50000 units CAPS capsule Take 1 capsule (50,000 Units total) by mouth every 14 (fourteen) days. 6 capsule 3      Objective:     BP 130/72 (BP Location: Left Arm, Patient Position: Sitting, Cuff Size: Normal)   Pulse (!) 104   Temp 98.4 F (36.9 C)   Ht 5' 8.5" (1.74 m)   Wt 165 lb (74.8 kg)   SpO2 96%   BMI 24.72 kg/m   SpO2: 96 % RA   Pleasant amb wf nad min spont cough with laughter  HEENT: nl dentition, turbinates bilaterally, and oropharynx. Nl external ear canals without cough reflex   NECK :  without JVD/Nodes/TM/ nl carotid upstrokes bilaterally   LUNGS: no acc muscle use,  Nl contour chest which is clear to A and P bilaterally without cough on insp or exp maneuvers   CV:  RRR  no s3 or murmur or increase in P2, and no edema   ABD:  soft and nontender with nl inspiratory excursion in the supine position. No bruits or  organomegaly appreciated, bowel sounds nl  MS:  Nl gait/ ext warm without deformities, calf tenderness, cyanosis or clubbing No obvious joint restrictions  SKIN: warm and dry without lesions    NEURO:  alert, approp, nl sensorium with  no motor or cerebellar deficits apparent.      I personally reviewed images and agree with radiology impression as follows:  CXR:   No acute cardiopulmonary disease    Assessment   Upper airway cough syndrome Onset mid feb 2020 actively vaping in pt with UC  - cyclical cough rx 02/26/8785   The most common causes of chronic cough in immunocompetent adults include the following: upper airway cough syndrome (UACS), previously referred to as postnasal drip syndrome (PNDS), which is caused by variety of rhinosinus conditions; (2) asthma; (3) GERD; (4) chronic bronchitis from cigarette smoking or other inhaled environmental irritants; (5) nonasthmatic eosinophilic bronchitis; and (6) bronchiectasis assoc with Ulcerative colitis.   These conditions, singly or in combination, have accounted for up to 94% of the causes of chronic cough in prospective studies.   Other conditions have constituted no >6% of the causes in prospective studies These have included bronchogenic carcinoma, chronic interstitial pneumonia, sarcoidosis, left ventricular failure, ACEI-induced cough, and aspiration from a condition associated with pharyngeal dysfunction.    Chronic cough is often simultaneously caused by more than one condition. A single cause has been found from 38 to 82% of the time, multiple causes from 18 to 62%. Multiply caused cough has been the result of three diseases up to 42% of the time.   Absence of noct or early am cough or response to saba favors UACS here: Upper airway cough syndrome (previously labeled PNDS),  is so named because it's frequently impossible to sort out how much is  CR/sinusitis with freq throat clearing (which can be related to primary GERD)   vs   causing  secondary (" extra esophageal")  GERD from wide swings in gastric pressure that occur with throat clearing, often  promoting self use of mint and menthol lozenges that reduce the lower esophageal sphincter tone and exacerbate the problem further in a cyclical fashion.   These are the same pts (now being labeled as having "irritable larynx syndrome" by some cough centers) who not infrequently have a history of having failed to tolerate ace inhibitors,  dry powder inhalers or biphosphonates or report having atypical/extraesophageal reflux symptoms that don't respond to standard doses of PPI  and are easily confused as having aecopd or asthma flares by even experienced allergists/ pulmonologists (myself included).      Of the three most common causes of  Sub-acute / recurrent or chronic cough, only one (GERD)  can actually contribute to/ trigger  the other two (asthma and post nasal drip syndrome)  and perpetuate the cylce of cough.  While not intuitively obvious, many patients with chronic low grade reflux do not cough until there is a primary insult that disturbs the protective epithelial barrier and exposes sensitive nerve endings.   This is typically viral but can due to PNDS and  With the former much more likely than the latter by hx .     >>> The point is that once this occurs, it is difficult to eliminate the cycle  using anything but a maximally effective acid suppression regimen at least in the short run, accompanied by an appropriate diet to address non acid GERD and control / eliminate the cough itself for at least 3 days with codeine while eliminating any Th-2 driven airways inflammation with short course of prednisone and cover the remote possibility of UC airway involvement which I doubt.  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      Total time devoted to counseling  > 50 % of  initial 60 min office visit:  review case with pt/ discussion of options/alternatives/ personally creating written customized instructions  in presence of pt  then going over those specific  Instructions directly with the pt including how to use all of the meds but in particular covering each new medication in detail and the difference between the maintenance= "automatic" meds and the prns using an action plan format for the latter (If this problem/symptom => do that organization reading Left to right).  Please see AVS from this visit for a full list of these instructions which I personally wrote for this pt and  are unique to this visit.     Christinia Gully, MD 04/07/2019

## 2019-04-14 ENCOUNTER — Ambulatory Visit: Payer: 59 | Admitting: Obstetrics and Gynecology

## 2019-04-15 ENCOUNTER — Telehealth: Payer: Self-pay | Admitting: General Practice

## 2019-04-15 ENCOUNTER — Other Ambulatory Visit: Payer: Self-pay

## 2019-04-15 ENCOUNTER — Telehealth: Payer: Self-pay | Admitting: Family Medicine

## 2019-04-15 ENCOUNTER — Telehealth: Payer: Self-pay | Admitting: Obstetrics and Gynecology

## 2019-04-15 DIAGNOSIS — Z20822 Contact with and (suspected) exposure to covid-19: Secondary | ICD-10-CM

## 2019-04-15 NOTE — Telephone Encounter (Signed)
Please see message and advise.  Thanks

## 2019-04-15 NOTE — Telephone Encounter (Signed)
Pt has been scheduled.  °

## 2019-04-15 NOTE — Telephone Encounter (Signed)
Call reviewed with Dr. Quincy Simmonds, call returned to patient. Recommended rescheduling AEX 3 mo out, patient should f/u with PCP if symptoms not resolving. Patient is agreeable to plan, AEX rescheduled to 07/20/19 at 3:30pm.   Patient states she has been taking Rx Vit D 50,000 IU q other week for 1 yr, asking if she will need Rx or switch to OTC? Advised will review with Dr. Quincy Simmonds and return call.   Dr. Quincy Simmonds -please advise on Vit D.

## 2019-04-15 NOTE — Telephone Encounter (Signed)
Spoke with patient. Covid 19 pre-screen positive.  Reports chronic productive cough with sore throat since February. Sore throat resolves throughout the day. Seen by PCP 12/2018, CXR negative, f/u recommended with pulmonology. Seen by pulmonology 04/07/19, tx for GERD. Patient states she has completed prednisolone and is taking pepcid and protonix, no change in symptoms. Temp taken daily at work, no fever/chills. 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 Vapes. No Covid19 testing has been completed, has not f/u with PCP. Patient denies any GYN concerns. Advised I will review with provider and return call.

## 2019-04-15 NOTE — Telephone Encounter (Signed)
Pt has been scheduled for Covid-19 testing. Scheduled with pt directly. Pt was referred by PCP Orma Flaming, MD

## 2019-04-15 NOTE — Telephone Encounter (Signed)
Spoke with patient, advised as seen below per Dr. Silva.  Patient verbalizes understanding and is agreeable.  Encounter closed.  

## 2019-04-15 NOTE — Telephone Encounter (Signed)
Patient's covid prescreen positive. Lingering cough from bronchitis diagnosed in February. No other symptoms. Fever is checked daily at work.

## 2019-04-15 NOTE — Telephone Encounter (Signed)
Let her know I ordered a covid test on her. The [facility] covid team will call her to schedule this. I can write a note once covid is back (if before baby) to take to gyn/dentist as well or we can fax if she has the number.  Let me know if any issues getting tested.  Orma Flaming, MD [place]

## 2019-04-15 NOTE — Telephone Encounter (Signed)
Patient is needing covid testing for employer due to chronic cough despite seeing pulmonology, myself and has UACS. Please schedule for covid testing so we can supply this to her employer.   Courtney Flaming, MD [place]

## 2019-04-15 NOTE — Telephone Encounter (Signed)
See note  Copied from [place] [id number]. Topic: Appointment Scheduling - Scheduling Inquiry for Clinic >> Apr 15, 2019 11:51 AM Margot Ables wrote: Reason for CRM: pt states her OBGYN and dentist will not see her due to chronic cough since January. Her employer is now telling her she needs to be tested for [place] in order to return to work. Pt states she has taken all meds prescribed by Dr. Rogers Blocker and pulmonary and is still having a cough. Please call to advise on appt and testing.

## 2019-04-15 NOTE — Telephone Encounter (Signed)
Ok to have her switch to oral vit D 1000 IU daily.  We can recheck a level at her annual exam visit.

## 2019-04-15 NOTE — Telephone Encounter (Signed)
Spoke to patient.  Advised of recommendations per Dr. Rogers Blocker, patient verbalizes understanding and will contact us back when she has results so that Dr. Rogers Blocker can write letter and either fax or patient can pick up.

## 2019-04-15 NOTE — Addendum Note (Signed)
Addended by: Denman George on: 04/15/2019 03:27 PM   Modules accepted: Orders

## 2019-04-18 ENCOUNTER — Other Ambulatory Visit: Payer: 59

## 2019-04-18 DIAGNOSIS — Z20822 Contact with and (suspected) exposure to covid-19: Secondary | ICD-10-CM

## 2019-04-19 ENCOUNTER — Ambulatory Visit: Payer: 59 | Admitting: Obstetrics and Gynecology

## 2019-04-19 LAB — NOVEL CORONAVIRUS, NAA: SARS-CoV-2, NAA: NOT DETECTED

## 2019-04-20 ENCOUNTER — Telehealth: Payer: Self-pay | Admitting: Family Medicine

## 2019-04-20 NOTE — Telephone Encounter (Signed)
Please call and let her know that I have her negative covid test and letter for work ready to be picked up for her employer.  Thanks!  Orma Flaming, MD [place]

## 2019-04-20 NOTE — Telephone Encounter (Signed)
Pt called back. °

## 2019-04-20 NOTE — Telephone Encounter (Signed)
Left voicemail message on patient's cell phone requesting a call back.

## 2019-04-20 NOTE — Telephone Encounter (Signed)
Spoke to patient.  Letter faxed.

## 2019-06-27 ENCOUNTER — Encounter: Payer: Self-pay | Admitting: Obstetrics and Gynecology

## 2019-07-04 ENCOUNTER — Telehealth: Payer: Self-pay | Admitting: Obstetrics and Gynecology

## 2019-07-04 NOTE — Telephone Encounter (Signed)
Please report results of BMD to patient.   She has osteopenia, but not osteoporosis.  Her fracture risk is still considered to be low.   I recommend calcium 600 mg twice daily or eat 4 calcium rich food daily, continued vitamin D, and weight bearing exercise.   We will do her follow up BMD in 2 years.

## 2019-07-11 NOTE — Telephone Encounter (Signed)
Called patient and per DPR, left detailed message below with Dr.Silva's recommendations. Advised to return call if she had any questions regarding her BMD.

## 2019-07-19 NOTE — Progress Notes (Signed)
58 y.o. G87P0000 Married Caucasian female here for annual exam.    Feeling a lump at the base of her neck.  Feels tired but has a stressful job.  Freezing at work all day long.  Does have constipation.   Working during the pandemic.   PCP: Orma Flaming, MD  No LMP recorded. Patient has had an ablation.           Sexually active: Yes.    The current method of family planning is post menopausal status.    Exercising: Yes.    walking Smoker:  no  Health Maintenance: Pap: 03-19-18 Neg:Neg HR HPV, 10/2015 Neg:Neg HR HPV History of abnormal Pap:  no MMG:  06-27-19 3D Neg/density C/BiRads1 Colonoscopy:2018 normal per patient;next due in 2023 to family hx & personal hx of polyps BMD: 06-27-19 Result  :Osteopenia, multiple sites TDaP: 03-26-09--Pt. Would like today Gardasil:   n/a HIV: Neg in the past Hep C:2017 Neg Screening Labs:   PCP Had her flu vaccine   reports that she quit smoking about 7 years ago. Her smoking use included cigarettes. She has never used smokeless tobacco. She reports current alcohol use of about 4.0 standard drinks of alcohol per week. She reports that she does not use drugs.  Past Medical History:  Diagnosis Date  . Abnormal uterine bleeding    when she had fibroid tumor  . Anxiety   . Blood in stool   . 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   Overview:  Clinically Left C4 Clinically Left C4  . Cancer [facility]) 2005   colon   . Cervical radiculopathy 01/10/2015  . Colon polyps   . Eating disorder   . Fatigue 01/25/2018  . Fibroid   . H/O rhinoplasty   . History of chicken pox   . Irregular heart beat 04/01/2018  . Knee pain, right 08/22/2015  . 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 Malignant tumor of colon ([place]) 04/01/2018  . Neck pain 09/12/2014  . Osteopenia 2018   hips and spine  . Palpitations 04/01/2018  . Perimenopausal disorder 04/01/2018  . Seasonal allergies   . Shortness of breath on exertion 06/08/2017  . Ulcerative  colitis ([place])   . Uterine leiomyoma 04/01/2018  . Vitamin D deficiency     Past Surgical History:  Procedure Laterality Date  . COLON SURGERY  2005   colon cancer--Dr.Rosenbower  . ENDOMETRIAL ABLATION  2012   Dr.Lomax  . MYOMECTOMY  2012   Dr.Lomax    Current Outpatient Medications  Medication Sig Dispense Refill  . ALPRAZolam (XANAX) 0.5 MG tablet Take 1 tablet (0.5 mg total) by mouth 3 (three) times daily as needed. 90 tablet 1  . Calcium Carbonate (CALCIUM 500 PO) Take 1 tablet by mouth 2 (two) times daily.    Marland Kitchen OVER THE COUNTER MEDICATION [place]--Takes 1 tablet tid    . sertraline (ZOLOFT) 100 MG tablet Take 1 tablet (100 mg total) by mouth daily. 90 tablet 3   No current facility-administered medications for this visit.     Family History  Problem Relation Age of Onset  . Osteoporosis Mother   . Hypertension Mother   . Arthritis Mother   . Hearing loss Mother   . Cancer Father        prostate  . Parkinson's disease Father        Dec age 38 from parkinsons/Lewey Body Dementia?  Marland Kitchen Hypertension Father   . Hyperlipidemia Father   . Osteoarthritis Father   .  Rheum arthritis Father   . Cancer Maternal Uncle 45       colon cancer  . Depression Maternal Grandmother   . Diabetes Maternal Grandfather   . Heart disease Maternal Grandfather   . Hypertension Maternal Grandfather   . Breast cancer Paternal Grandmother   . Cancer Paternal Grandfather     Review of Systems  HENT:       [place]en area at base of neck/throat  All other systems reviewed and are negative.   Exam:   BP 138/82   Pulse 80   Temp (!) 97.2 F (36.2 C) (Temporal)   Resp 14   Ht 5\' 7"  (1.702 m)   Wt 164 lb 3.2 oz (74.5 kg)   BMI 25.72 kg/m     General appearance: alert, cooperative and appears stated age Head: normocephalic, without obvious abnormality, atraumatic Neck: no adenopathy, supple, symmetrical, trachea midline and thyroid - midline enlargment and nontender.  Lungs: clear to  auscultation bilaterally Breasts: normal appearance, no masses or tenderness, No nipple retraction or dimpling, No nipple discharge or bleeding, No axillary adenopathy Heart: regular rate and rhythm Abdomen: soft, non-tender; no masses, no organomegaly.  1.5 cm umbilical hernia. Extremities: extremities normal, atraumatic, no cyanosis or edema Skin: skin color, texture, turgor normal. No rashes or lesions Lymph nodes: cervical, supraclavicular, and axillary nodes normal. Neurologic: grossly normal  Pelvic: External genitalia:  no lesions              No abnormal inguinal nodes palpated.              Urethra:  normal appearing urethra with no masses, tenderness or lesions              Bartholins and Skenes: normal                 Vagina: normal appearing vagina with normal color and discharge, no lesions              Cervix: no lesions              Pap taken: No. Bimanual Exam:  Uterus:  normal size, contour, position, consistency, mobility, non-tender              Adnexa: no mass, fullness, tenderness              Rectal exam: Yes.  .  Confirms.              Anus:  normal sphincter tone, no lesions  Chaperone was present for exam.  Assessment:   Well woman visit with normal exam. Pelvic pain.  Status post endometrial ablation and myomectomy.  Small umbilical hernia. Hx osteopenia.  Hx vit D deficiency.  Personal hx colon cancer and collagenous colitis. Neck swelling.  I suspect thyroid enlargement.   Plan: Mammogram screening discussed. Self breast awareness reviewed. Pap and HR HPV as above. Guidelines for Calcium, Vitamin D, regular exercise program including cardiovascular and weight bearing exercise. TDap.  Routine labs including TFTs.  BMD in 2 years.  Thyroid ultrasound.  Follow up annually and prn.    After visit summary provided.

## 2019-07-20 ENCOUNTER — Other Ambulatory Visit: Payer: Self-pay

## 2019-07-20 ENCOUNTER — Ambulatory Visit: Payer: 59 | Admitting: Obstetrics and Gynecology

## 2019-07-20 ENCOUNTER — Encounter: Payer: Self-pay | Admitting: Obstetrics and Gynecology

## 2019-07-20 VITALS — BP 138/82 | HR 80 | Temp 97.2°F | Resp 14 | Ht 67.0 in | Wt 164.2 lb

## 2019-07-20 DIAGNOSIS — Z23 Encounter for immunization: Secondary | ICD-10-CM | POA: Diagnosis not present

## 2019-07-20 DIAGNOSIS — R221 Localized swelling, mass and lump, neck: Secondary | ICD-10-CM

## 2019-07-20 DIAGNOSIS — Z01419 Encounter for gynecological examination (general) (routine) without abnormal findings: Secondary | ICD-10-CM

## 2019-07-20 NOTE — Patient Instructions (Signed)

## 2019-07-21 LAB — CBC
Hematocrit: 38.8 % (ref 34.0–46.6)
Hemoglobin: 12.5 g/dL (ref 11.1–15.9)
MCH: 30 pg (ref 26.6–33.0)
MCHC: 32.2 g/dL (ref 31.5–35.7)
MCV: 93 fL (ref 79–97)
Platelets: 250 10*3/uL (ref 150–450)
RBC: 4.16 x10E6/uL (ref 3.77–5.28)
RDW: 12.4 % (ref 11.7–15.4)
WBC: 7.3 10*3/uL (ref 3.4–10.8)

## 2019-07-21 LAB — COMPREHENSIVE METABOLIC PANEL
ALT: 16 IU/L (ref 0–32)
AST: 20 IU/L (ref 0–40)
Albumin/Globulin Ratio: 2 (ref 1.2–2.2)
Albumin: 4.7 g/dL (ref 3.8–4.9)
Alkaline Phosphatase: 105 IU/L (ref 39–117)
BUN/Creatinine Ratio: 24 — ABNORMAL HIGH (ref 9–23)
BUN: 12 mg/dL (ref 6–24)
Bilirubin Total: 0.2 mg/dL (ref 0.0–1.2)
CO2: 23 mmol/L (ref 20–29)
Calcium: 9.3 mg/dL (ref 8.7–10.2)
Chloride: 105 mmol/L (ref 96–106)
Creatinine, Ser: 0.49 mg/dL — ABNORMAL LOW (ref 0.57–1.00)
GFR calc Af Amer: 126 mL/min/{1.73_m2} (ref >59)
GFR calc non Af Amer: 109 mL/min/{1.73_m2} (ref >59)
Globulin, Total: 2.4 g/dL (ref 1.5–4.5)
Glucose: 79 mg/dL (ref 65–99)
Potassium: 3.9 mmol/L (ref 3.5–5.2)
Sodium: 142 mmol/L (ref 134–144)
Total Protein: 7.1 g/dL (ref 6.0–8.5)

## 2019-07-21 LAB — TSH: TSH: 3.47 u[IU]/mL (ref 0.450–4.500)

## 2019-07-21 LAB — T4, FREE: Free T4: 1.21 ng/dL (ref 0.82–1.77)

## 2019-07-21 LAB — LIPID PANEL
Chol/HDL Ratio: 2 ratio (ref 0.0–4.4)
Cholesterol, Total: 240 mg/dL — ABNORMAL HIGH (ref 100–199)
HDL: 123 mg/dL (ref >39)
LDL Chol Calc (NIH): 96 mg/dL (ref 0–99)
Triglycerides: 128 mg/dL (ref 0–149)
VLDL Cholesterol Cal: 21 mg/dL (ref 5–40)

## 2019-07-21 LAB — VITAMIN D 25 HYDROXY (VIT D DEFICIENCY, FRACTURES): Vit D, 25-Hydroxy: 17.6 ng/mL — ABNORMAL LOW (ref 30.0–100.0)

## 2019-07-28 ENCOUNTER — Telehealth: Payer: Self-pay | Admitting: Obstetrics and Gynecology

## 2019-07-28 ENCOUNTER — Telehealth: Payer: Self-pay

## 2019-07-28 DIAGNOSIS — R221 Localized swelling, mass and lump, neck: Secondary | ICD-10-CM

## 2019-07-28 MED ORDER — VITAMIN D (ERGOCALCIFEROL) 1.25 MG (50000 UNIT) PO CAPS: 50000.0000 [IU] | ORAL_CAPSULE | ORAL | 3 refills | Status: DC

## 2019-07-28 NOTE — Telephone Encounter (Signed)
Spoke with Melody at [facility], requesting new order for Thyroid US. New order placed for US Thyroid, Dx: Neck swelling. Melody will cancel previous order.   Routing to Dr. Antony Blackbird.   Encounter closed.

## 2019-07-28 NOTE — Telephone Encounter (Signed)
Called patient and per DPR, and voicemail confirmed her name, left detailed message below with lab results. Sent Rx for Vit.D 50,000 IU, 1 po weekly #12, 3R to pharmacy on file. Advised will call back with thyroid ultraound result.

## 2019-07-28 NOTE — Telephone Encounter (Signed)
Melody from [facility] left a voicemail requesting clarification on orders that were sent over.

## 2019-07-28 NOTE — Telephone Encounter (Signed)
-----   Message from Nunzio Cobbs, MD sent at 07/26/2019 10:27 AM EDT ----- Please contact patient with results.  Her vit D level is still low, and I am recommending vit D 50,000 IU orally weekly for the next year.  Please send to her pharmacy of choice.   Her cholesterol is elevated, but for a good reason.  Her HDL, the good cholesterol, is really high.   Her creatinine is a little low, but this is ok.  It would be of concern if it were high.   Her blood counts and thyroid are normal.   I did order her thyroid ultrasound to evaluate the swelling in her neck.   I am highlighting this result so you know to contact the patient.

## 2019-08-11 ENCOUNTER — Ambulatory Visit
Admission: RE | Admit: 2019-08-11 | Discharge: 2019-08-11 | Disposition: A | Discharge status: Home / Self Care | Payer: 59 | Source: Ambulatory Visit | Attending: Obstetrics and Gynecology | Admitting: Obstetrics and Gynecology

## 2019-08-11 DIAGNOSIS — R221 Localized swelling, mass and lump, neck: Secondary | ICD-10-CM

## 2019-08-12 ENCOUNTER — Other Ambulatory Visit: Payer: Self-pay | Admitting: *Deleted

## 2019-08-12 ENCOUNTER — Encounter: Payer: Self-pay | Admitting: Obstetrics and Gynecology

## 2019-08-12 DIAGNOSIS — E041 Nontoxic single thyroid nodule: Secondary | ICD-10-CM

## 2019-08-12 NOTE — Progress Notes (Signed)
ref

## 2019-08-24 ENCOUNTER — Other Ambulatory Visit: Payer: Self-pay

## 2019-08-26 ENCOUNTER — Other Ambulatory Visit: Payer: Self-pay

## 2019-08-26 ENCOUNTER — Encounter: Payer: Self-pay | Admitting: Internal Medicine

## 2019-08-26 ENCOUNTER — Ambulatory Visit (INDEPENDENT_AMBULATORY_CARE_PROVIDER_SITE_OTHER): Payer: 59 | Admitting: Internal Medicine

## 2019-08-26 VITALS — BP 138/84 | HR 77 | Temp 98.2°F | Ht 67.0 in | Wt 163.2 lb

## 2019-08-26 DIAGNOSIS — E041 Nontoxic single thyroid nodule: Secondary | ICD-10-CM | POA: Diagnosis not present

## 2019-08-26 NOTE — Patient Instructions (Signed)
-   You have a left thyroid nodule that is benign looking, will need to repeat the ultrasound in a year

## 2019-08-26 NOTE — Progress Notes (Signed)
Name: Courtney Watson  MRN/ DOB: [id number], [birth year]    Age/ Sex: 58 y.o., female    PCP: Orma Flaming, MD   Reason for Endocrinology Evaluation: Left thyroid Nodule      Date of Initial Endocrinology Evaluation: 08/26/2019     HPI: Courtney Watson is a 58 y.o. female with a past medical history of Anxiety and Hx of colon cancer ( S/p resection 2005 ) .  The patient presented for initial endocrinology clinic visit on 08/26/2019 for consultative assistance with her Left thyroid Nodule .   Courtney Watson was noted to have a left thyroid nodule on exam at her Gyn physical. She has noted some fullness over the past few months, this has been stable.    No dysphagia, or hoarseness, she feels that at times when she lays down   No radiation exposure   No Biotin   No FH of thyroid nodule  Two maternal uncles with colon cancer   HISTORY:  Past Medical History:  Past Medical History:  Diagnosis Date  . Abnormal uterine bleeding    when she had fibroid tumor  . Anxiety   . Blood in stool   . 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   Overview:  Clinically Left C4 Clinically Left C4  . Cancer [facility]) 2005   colon   . Cervical radiculopathy 01/10/2015  . Colon polyps   . Eating disorder   . Fatigue 01/25/2018  . Fibroid   . H/O rhinoplasty   . History of chicken pox   . Irregular heart beat 04/01/2018  . Knee pain, right 08/22/2015  . Left thyroid nodule    needs follow up ultrasound in 2021  . Malignant neoplasm of large intestine ([place]) 09/12/2014  . Malignant tumor of colon ([place]) 04/01/2018  . Neck pain 09/12/2014  . Osteopenia 2018   hips and spine  . Palpitations 04/01/2018  . Perimenopausal disorder 04/01/2018  . Seasonal allergies   . Shortness of breath on exertion 06/08/2017  . Ulcerative colitis ([place])   . Uterine leiomyoma 04/01/2018  . Vitamin D deficiency    Past Surgical History:  Past Surgical History:  Procedure Laterality Date  . COLON SURGERY  2005   colon cancer--Dr.Rosenbower  . ENDOMETRIAL ABLATION  2012   Dr.Lomax  . MYOMECTOMY  2012   Dr.Lomax      Social History:  reports that she quit smoking about 7 years ago. Her smoking use included cigarettes. She has never used smokeless tobacco. She reports current alcohol use of about 4.0 standard drinks of alcohol per week. She reports that she does not use drugs.  Family History: family history includes Arthritis in her mother; Breast cancer in her paternal grandmother; Cancer in her father and paternal grandfather; Cancer (age of onset: 25) in her maternal uncle; Depression in her maternal grandmother; Diabetes in her maternal grandfather; Hearing loss in her mother; Heart disease in her maternal grandfather; Hyperlipidemia in her father; Hypertension in her father, maternal grandfather, and mother; Osteoarthritis in her father; Osteoporosis in her mother; Parkinson's disease in her father; Rheum arthritis in her father.   HOME MEDICATIONS: Allergies as of 08/26/2019   No Known Allergies     Medication List       Accurate as of August 26, 2019  9:39 AM. If you have any questions, ask your nurse or doctor.        ALPRAZolam 0.5 MG tablet Commonly known as: XANAX Take 1 tablet (0.5 mg total) by  mouth 3 (three) times daily as needed.   CALCIUM 500 PO Take 1 tablet by mouth 2 (two) times daily.   OVER THE COUNTER MEDICATION Ibgard--Takes 1 tablet tid   sertraline 100 MG tablet Commonly known as: ZOLOFT Take 1 tablet (100 mg total) by mouth daily.   Vitamin D (Ergocalciferol) 1.25 MG (50000 UT) Caps capsule Commonly known as: DRISDOL Take 1 capsule (50,000 Units total) by mouth every 7 (seven) days.         REVIEW OF SYSTEMS: A comprehensive ROS was conducted with the patient and is negative except as per HPI and below:  Review of Systems  Constitutional: Positive for malaise/fatigue. Negative for weight loss.  HENT: Negative for congestion and sore throat.    Respiratory: Negative for cough and shortness of breath.   Cardiovascular: Negative for chest pain and palpitations.  Gastrointestinal: Positive for constipation. Negative for nausea.  Neurological: Negative for tingling and tremors.  Psychiatric/Behavioral: Negative for depression. The patient is nervous/anxious.        OBJECTIVE:  VS: BP 138/84 (BP Location: Left Arm, Patient Position: Sitting, Cuff Size: Normal)   Pulse 77   Temp 98.2 F (36.8 C)   Ht 5\' 7"  (1.702 m)   Wt 163 lb 3.2 oz (74 kg)   SpO2 97%   BMI 25.56 kg/m    Wt Readings from Last 3 Encounters:  08/26/19 163 lb 3.2 oz (74 kg)  07/20/19 164 lb 3.2 oz (74.5 kg)  04/07/19 165 lb (74.8 kg)     EXAM: General: Pt appears well and is in NAD  Hydration: Well-hydrated with moist mucous membranes and good skin turgor  Eyes: External eye exam normal without stare, lid lag or exophthalmos.  EOM intact.   Ears, Nose, Throat: Hearing: Grossly intact bilaterally Dental: Good dentition  Throat: Clear without mass, erythema or exudate  Neck: General: Supple without adenopathy. Thyroid: Thyroid size normal.  No goiter or nodules appreciated. No thyroid bruit.  Lungs: Clear with good BS bilat with no rales, rhonchi, or wheezes  Heart: Auscultation: RRR.  Abdomen: Normoactive bowel sounds, soft, nontender, without masses or organomegaly palpable  Extremities: Gait and station: Normal gait  Digits and nails: No clubbing, cyanosis, petechiae, or nodes Head and neck: Normal alignment and mobility BL UE: Normal ROM and strength. BL LE: No pretibial edema normal ROM and strength.  Skin: Hair: Texture and amount normal with gender appropriate distribution Skin Inspection: No rashes, acanthosis nigricans/skin tags. No lipohypertrophy Skin Palpation: Skin temperature, texture, and thickness normal to palpation  Neuro: Cranial nerves: II - XII grossly intact  Cerebellar: Normal coordination and movement; no tremor Motor: Normal  strength throughout DTRs: 2+ and symmetric in UE without delay in relaxation phase  Mental Status: Judgment, insight: Intact Orientation: Oriented to time, place, and person Memory: Intact for recent and remote events Mood and affect: No depression, anxiety, or agitation     DATA REVIEWED: Results for Courtney Watson, Courtney Watson (MRN [id number]) as of 08/26/2019 07:33  Ref. Range 07/20/2019 16:24  TSH Latest Ref Range: 0.450 - 4.500 uIU/mL 3.470  T4,Free(Direct) Latest Ref Range: 0.82 - 1.77 ng/dL 1.21       Thyroid Ultrasound 08/11/2019   FINDINGS: Parenchymal Echotexture: Mildly heterogenous  Isthmus: 0.2 cm  Right lobe: 4.8 x 1.4 x 1.8 cm  Left lobe: 4.7 x 1.3 x 1.6 cm  _________________________________________________________  Estimated total number of nodules >/= 1 cm: 1  Number of spongiform nodules >/=  2 cm not described below (TR1):  0  Number of mixed cystic and solid nodules >/= 1.5 cm not described below ([place]): 0  _________________________________________________________  Nodule # 1:  Location: Left; Mid  Maximum size: 1.5 cm; Other 2 dimensions: 1.0 x 1.1 cm  Composition: solid/almost completely solid (2)  Echogenicity: isoechoic (1)  Shape: not taller-than-wide (0)  Margins: ill-defined (0)  Echogenic foci: none (0)  ACR TI-RADS total points: 3.  ACR TI-RADS risk category: TR3 (3 points).  ACR TI-RADS recommendations:  *Given size (>/= 1.5 - 2.4 cm) and appearance, a follow-up ultrasound in 1 year should be considered based on TI-RADS criteria.  _________________________________________________________  Additional area of measured potential nodularity along the posterior aspect of the medial right lobe appears to be an area of lobulation of the thyroid gland and not a discrete nodule. No enlarged or abnormal appearing lymph nodes are identified by ultrasound.  IMPRESSION: 1.5 cm left mid thyroid nodule meets criteria for 1  year follow-up ultrasound. Overall volume of the thyroid gland is within normal limits. ASSESSMENT/PLAN/RECOMMENDATIONS:   1. Left Thyroid Nodule :  - No local neck symptoms  - Clinically and biochemically euthyroid  - We discussed that MNG are very common and > 97% nodules are benign.  - No indication for FNA of the left thyroid nodule at this time, will repeat Ultrasound in 1 yr   F/U in 1 yr   Signed electronically by: Mack Guise, MD  [facility]  [facility] [address] [place], [place] [address] Phone: [phone] FAX: [phone]   CC: Orma Flaming, [place] [address] Phone: [phone] Fax: [phone]   Return to Endocrinology clinic as below: Future Appointments  Date Time Provider [place]  09/05/2020  9:30 AM Nunzio Cobbs, MD [place] None

## 2019-11-02 NOTE — Progress Notes (Signed)
Cardiology Office Note:    Date:  11/03/2019   ID:  Courtney Watson, DOB 06-13-1961, MRN [id number]  PCP:  Orma Flaming, MD  Cardiologist:  Shirlee More, MD    Referring MD: Orma Flaming, MD    ASSESSMENT:    1. APC (atrial premature contractions)   2. Palpitations   3. Educated about COVID-19 virus infection    PLAN:    In order of problems listed above:  1. Continues to have low level symptoms not bothersome enough to take suppressant treatment or repeat cardiac testing but after discussion I will give her a prescription for small amount of short acting beta-blocker that she could take if needed especially during COVID-19 to avoid ED visits. 2. She has good healthcare literacy and she will begin to use medical eye protection as described   Next appointment: Year   Medication Adjustments/Labs and Tests Ordered: Current medicines are reviewed at length with the patient today.  Concerns regarding medicines are outlined above.  No orders of the defined types were placed in this encounter.  No orders of the defined types were placed in this encounter.   Chief Complaint  Patient presents with  . Follow-up    For frequent APCs    History of Present Illness:    Courtney Watson is a 58 y.o. female with a hx of APC's last seen 06/16/2018. Compliance with diet, lifestyle and medications: Yes  He continues to have intermittent palpitation not severe prolonged it happens at night and positional on her left side.  We discussed I gave her a prescription for small dose of short acting beta-blocker she can take as needed.  Other than that no chest pain shortness of breath no syncope.  She takes no over-the-counter proarrhythmic drugs.  She has good healthcare practices for COVID-19 I told her it is time to start wearing medical eye protection when she is in stores with commercial ventilation systems. Past Medical History:  Diagnosis Date  . Abnormal uterine bleeding    when she  had fibroid tumor  . Anxiety   . Blood in stool   . 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   Overview:  Clinically Left C4 Clinically Left C4  . Cancer [facility]) 2005   colon   . Cervical radiculopathy 01/10/2015  . Colon polyps   . Eating disorder   . Fatigue 01/25/2018  . Fibroid   . H/O rhinoplasty   . History of chicken pox   . Irregular heart beat 04/01/2018  . Knee pain, right 08/22/2015  . Left thyroid nodule    needs follow up ultrasound in 2021  . Malignant neoplasm of large intestine ([place]) 09/12/2014  . Malignant tumor of colon ([place]) 04/01/2018  . Neck pain 09/12/2014  . Osteopenia 2018   hips and spine  . Palpitations 04/01/2018  . Perimenopausal disorder 04/01/2018  . Seasonal allergies   . Shortness of breath on exertion 06/08/2017  . Ulcerative colitis ([place])   . Uterine leiomyoma 04/01/2018  . Vitamin D deficiency     Past Surgical History:  Procedure Laterality Date  . COLON SURGERY  2005   colon cancer--Dr.Rosenbower  . ENDOMETRIAL ABLATION  2012   Dr.Lomax  . MYOMECTOMY  2012   Dr.Lomax    Current Medications: Current Meds  Medication Sig  . ALPRAZolam (XANAX) 0.5 MG tablet Take 1 tablet (0.5 mg total) by mouth 3 (three) times daily as needed.  . Calcium Carbonate (CALCIUM 500 PO) Take 1 tablet by mouth 2 (two)  times daily.  Marland Kitchen OVER THE COUNTER MEDICATION Ibgard--Takes 1 tablet tid  . sertraline (ZOLOFT) 100 MG tablet Take 1 tablet (100 mg total) by mouth daily.  . Vitamin D, Ergocalciferol, (DRISDOL) 1.25 MG (50000 UT) CAPS capsule Take 1 capsule (50,000 Units total) by mouth every 7 (seven) days.     Allergies:   Patient has no known allergies.   Social History   Socioeconomic History  . Marital status: Married    Spouse name: Not on file  . Number of children: Not on file  . Years of education: Not on file  . Highest education level: Not on file  Occupational History  . Not on file  Tobacco Use  . Smoking status: Former Smoker    Types:  Cigarettes    Quit date: 11/18/2011    Years since quitting: 7.9  . Smokeless tobacco: Never Used  Substance and Sexual Activity  . Alcohol use: Yes    Alcohol/week: 4.0 standard drinks    Types: 4 Cans of beer per week    Comment: on the weekends  . Drug use: No  . Sexual activity: Yes    Partners: Male    Birth control/protection: Post-menopausal    Comment: Ablation 2012  Other Topics Concern  . Not on file  Social History Narrative  . Not on file   Social Determinants of Health   Financial Resource Strain:   . Difficulty of Paying Living Expenses: Not on file  Food Insecurity:   . Worried About Charity fundraiser in the Last Year: Not on file  . Ran Out of Food in the Last Year: Not on file  Transportation Needs:   . Lack of Transportation (Medical): Not on file  . Lack of Transportation (Non-Medical): Not on file  Physical Activity:   . Days of Exercise per Week: Not on file  . Minutes of Exercise per Session: Not on file  Stress:   . Feeling of Stress : Not on file  Social Connections:   . Frequency of Communication with Friends and Family: Not on file  . Frequency of Social Gatherings with Friends and Family: Not on file  . Attends Religious Services: Not on file  . Active Member of Clubs or Organizations: Not on file  . Attends Archivist Meetings: Not on file  . Marital Status: Not on file     Family History: The patient's family history includes Arthritis in her mother; Breast cancer in her paternal grandmother; Cancer in her father and paternal grandfather; Cancer (age of onset: 50) in her maternal uncle; Depression in her maternal grandmother; Diabetes in her maternal grandfather; Hearing loss in her mother; Heart disease in her maternal grandfather; Hyperlipidemia in her father; Hypertension in her father, maternal grandfather, and mother; Osteoarthritis in her father; Osteoporosis in her mother; Parkinson's disease in her father; Rheum arthritis in  her father. ROS:   Please see the history of present illness.    All other systems reviewed and are negative.  EKGs/Labs/Other Studies Reviewed:    The following studies were reviewed today:  EKG:  EKG ordered today and personally reviewed.  The ekg ordered today demonstrates this rhythm and normal  Holter 04/22/2018 with frequent APC's, burden 13.6% and 10 runs of APC's longest 30 secs rates 110-155 BPM  Echo 04/22/2018 normal except for mild LAE  Recent Labs: 07/20/2019: ALT 16; BUN 12; Creatinine, Ser 0.49; Hemoglobin 12.5; Platelets 250; Potassium 3.9; Sodium 142; TSH 3.470  Recent Lipid Panel  Component Value Date/Time   CHOL 240 (H) 07/20/2019 1624   TRIG 128 07/20/2019 1624   HDL 123 07/20/2019 1624   CHOLHDL 2.0 07/20/2019 1624   CHOLHDL 1.8 03/18/2017 1506   VLDL 13 03/18/2017 1506   LDLCALC 96 07/20/2019 1624    Physical Exam:    VS:  BP 138/74 (BP Location: Right Arm, Patient Position: Sitting, Cuff Size: Normal)   Pulse 85   Ht 5' 7.5" (1.715 m)   Wt 166 lb (75.3 kg)   SpO2 93%   BMI 25.62 kg/m     Wt Readings from Last 3 Encounters:  11/03/19 166 lb (75.3 kg)  08/26/19 163 lb 3.2 oz (74 kg)  07/20/19 164 lb 3.2 oz (74.5 kg)     GEN:  Well nourished, well developed in no acute distress HEENT: Normal NECK: No JVD; No carotid bruits LYMPHATICS: No lymphadenopathy CARDIAC: RRR, no murmurs, rubs, gallops RESPIRATORY:  Clear to auscultation without rales, wheezing or rhonchi  ABDOMEN: Soft, non-tender, non-distended MUSCULOSKELETAL:  No edema; No deformity  SKIN: Warm and dry NEUROLOGIC:  Alert and oriented x 3 PSYCHIATRIC:  Normal affect    Signed, Shirlee More, MD  11/03/2019 2:00 PM    [place]

## 2019-11-03 ENCOUNTER — Other Ambulatory Visit: Payer: Self-pay

## 2019-11-03 ENCOUNTER — Encounter: Payer: Self-pay | Admitting: Cardiology

## 2019-11-03 ENCOUNTER — Ambulatory Visit (INDEPENDENT_AMBULATORY_CARE_PROVIDER_SITE_OTHER): Payer: 59 | Admitting: Cardiology

## 2019-11-03 VITALS — BP 138/74 | HR 85 | Ht 67.5 in | Wt 166.0 lb

## 2019-11-03 DIAGNOSIS — I491 Atrial premature depolarization: Secondary | ICD-10-CM

## 2019-11-03 DIAGNOSIS — Z8249 Family history of ischemic heart disease and other diseases of the circulatory system: Secondary | ICD-10-CM

## 2019-11-03 DIAGNOSIS — R002 Palpitations: Secondary | ICD-10-CM | POA: Diagnosis not present

## 2019-11-03 DIAGNOSIS — Z7189 Other specified counseling: Secondary | ICD-10-CM

## 2019-11-03 MED ORDER — METOPROLOL TARTRATE 25 MG PO TABS: ORAL_TABLET | ORAL | 6 refills | Status: DC

## 2019-11-03 NOTE — Addendum Note (Signed)
Addended by: Austin Miles on: 11/03/2019 02:07 PM   Modules accepted: Orders

## 2019-11-03 NOTE — Patient Instructions (Addendum)
Medication Instructions:  .Your physician has recommended you make the following change in your medication:   START over-the-counter magnesium (as directed on the bottle)   START metoprolol tartrate (lopressor) 25 mg: Take 1/2 tablet (12.5 mg) daily as needed for palpitations  *If you need a refill on your cardiac medications before your next appointment, please call your pharmacy*  Lab Work: None  If you have labs (blood work) drawn today and your tests are completely normal, you will receive your results only by: Marland Kitchen MyChart Message (if you have MyChart) OR . A paper copy in the mail If you have any lab test that is abnormal or we need to change your treatment, we will call you to review the results.  Testing/Procedures: You had an EKG today.   Follow-Up: At [facility], you and your health needs are our priority.  As part of our continuing mission to provide you with exceptional heart care, we have created designated Provider Care Teams.  These Care Teams include your primary Cardiologist (physician) and Advanced Practice Providers (APPs -  Physician Assistants and Nurse Practitioners) who all work together to provide you with the care you need, when you need it.  Your next appointment:   1 year(s)  The format for your next appointment:   In Person  Provider:   Shirlee More, MD   Metoprolol tablets What is this medicine? METOPROLOL (me TOE proe lole) is a beta-blocker. Beta-blockers reduce the workload on the heart and help it to beat more regularly. This medicine is used to treat high blood pressure and to prevent chest pain. It is also used to after a heart attack and to prevent an additional heart attack from occurring. This medicine may be used for other purposes; ask your health care provider or pharmacist if you have questions. COMMON BRAND NAME(S): Lopressor What should I tell my health care provider before I take this medicine? They need to know if you have any of  these conditions:  diabetes  heart or vessel disease like slow heart rate, worsening heart failure, heart block, sick sinus syndrome or Raynaud's disease  kidney disease  liver disease  lung or breathing disease, like asthma or emphysema  pheochromocytoma  thyroid disease  an unusual or allergic reaction to metoprolol, other beta-blockers, medicines, foods, dyes, or preservatives  pregnant or trying to get pregnant  breast-feeding How should I use this medicine? Take this medicine by mouth with a drink of water. Follow the directions on the prescription label. Take this medicine immediately after meals. Take your doses at regular intervals. Do not take more medicine than directed. Do not stop taking this medicine suddenly. This could lead to serious heart-related effects. Talk to your pediatrician regarding the use of this medicine in children. Special care may be needed. Overdosage: If you think you have taken too much of this medicine contact a poison control center or emergency room at once. NOTE: This medicine is only for you. Do not share this medicine with others. What if I miss a dose? If you miss a dose, take it as soon as you can. If it is almost time for your next dose, take only that dose. Do not take double or extra doses. What may interact with this medicine? This medicine may interact with the following medications:  certain medicines for blood pressure, heart disease, irregular heart beat  certain medicines for depression like monoamine oxidase (MAO) inhibitors, fluoxetine, or paroxetine  clonidine  dobutamine  epinephrine  isoproterenol  reserpine This list may not describe all possible interactions. Give your health care provider a list of all the medicines, herbs, non-prescription drugs, or dietary supplements you use. Also tell them if you smoke, drink alcohol, or use illegal drugs. Some items may interact with your medicine. What should I watch for  while using this medicine? Visit your doctor or health care professional for regular check ups. Contact your doctor right away if your symptoms worsen. Check your blood pressure and pulse rate regularly. Ask your health care professional what your blood pressure and pulse rate should be, and when you should contact them. You may get drowsy or dizzy. Do not drive, use machinery, or do anything that needs mental alertness until you know how this medicine affects you. Do not sit or stand up quickly, especially if you are an older patient. This reduces the risk of dizzy or fainting spells. Contact your doctor if these symptoms continue. Alcohol may interfere with the effect of this medicine. Avoid alcoholic drinks. This medicine may increase blood sugar. Ask your healthcare provider if changes in diet or medicines are needed if you have diabetes. What side effects may I notice from receiving this medicine? Side effects that you should report to your doctor or health care professional as soon as possible:  allergic reactions like skin rash, itching or hives  cold or numb hands or feet  depression  difficulty breathing  faint  fever with sore throat  irregular heartbeat, chest pain  rapid weight gain   signs and symptoms of high blood sugar such as being more thirsty or hungry or having to urinate more than normal. You may also feel very tired or have blurry vision.  swollen legs or ankles Side effects that usually do not require medical attention (report to your doctor or health care professional if they continue or are bothersome):  anxiety or nervousness  change in sex drive or performance  dry skin  headache  nightmares or trouble sleeping  short term memory loss  stomach upset or diarrhea This list may not describe all possible side effects. Call your doctor for medical advice about side effects. You may report side effects to FDA at 1-800-FDA-1088. Where should I keep my  medicine? Keep out of the reach of children. Store at room temperature between 15 and 30 degrees C (59 and 86 degrees F). Throw away any unused medicine after the expiration date. NOTE: This sheet is a summary. It may not cover all possible information. If you have questions about this medicine, talk to your doctor, pharmacist, or health care provider.  2020 Elsevier/Gold Standard (2018-08-24 11:15:23)     Purchase on line at [employer] or at [employer] on [address]

## 2019-11-17 ENCOUNTER — Encounter

## 2020-02-06 ENCOUNTER — Ambulatory Visit: Payer: 59 | Attending: Internal Medicine

## 2020-02-06 DIAGNOSIS — Z23 Encounter for immunization: Secondary | ICD-10-CM

## 2020-02-06 NOTE — Progress Notes (Signed)
   Covid-19 Vaccination Clinic  Name:  Courtney Watson    MRN: [id number] DOB: March 07, 1961  02/06/2020  [name] was observed post Covid-19 immunization for 15 minutes without incident. She was provided with Vaccine Information Sheet and instruction to access the V-Safe system.   [name] was instructed to call 911 with any severe reactions post vaccine: Marland Kitchen Difficulty breathing  . Swelling of face and throat  . A fast heartbeat  . A bad rash all over body  . Dizziness and weakness   Immunizations Administered    Name Date Dose VIS Date Route   Pfizer COVID-19 Vaccine 02/06/2020 10:43 AM 0.3 mL 10/28/2019 Intramuscular   Manufacturer: [place]   Lot: [id number]   [place]: [id number]

## 2020-02-21 ENCOUNTER — Other Ambulatory Visit: Payer: Self-pay | Admitting: Psychiatry

## 2020-02-29 ENCOUNTER — Ambulatory Visit: Payer: 59 | Attending: Internal Medicine

## 2020-02-29 DIAGNOSIS — Z23 Encounter for immunization: Secondary | ICD-10-CM

## 2020-02-29 NOTE — Progress Notes (Signed)
   Covid-19 Vaccination Clinic  Name:  Dakoda Bergman    MRN: [id number] DOB: April 22, 1961  02/29/2020  Ms. Benton was observed post Covid-19 immunization for 15 minutes without incident. She was provided with Vaccine Information Sheet and instruction to access the V-Safe system.   Ms. Mcdaris was instructed to call 911 with any severe reactions post vaccine: Marland Kitchen Difficulty breathing  . Swelling of face and throat  . A fast heartbeat  . A bad rash all over body  . Dizziness and weakness   Immunizations Administered    Name Date Dose VIS Date Route   Pfizer COVID-19 Vaccine 02/29/2020 11:26 AM 0.3 mL 10/28/2019 Intramuscular   Manufacturer: [place]   Lot: [id number]   [place]: [id number]

## 2020-04-06 ENCOUNTER — Ambulatory Visit (INDEPENDENT_AMBULATORY_CARE_PROVIDER_SITE_OTHER): Payer: 59 | Admitting: Family Medicine

## 2020-04-06 ENCOUNTER — Other Ambulatory Visit: Payer: Self-pay

## 2020-04-06 ENCOUNTER — Encounter: Payer: Self-pay | Admitting: Family Medicine

## 2020-04-06 VITALS — BP 150/80 | HR 80 | Temp 98.0°F | Ht 67.5 in | Wt 166.2 lb

## 2020-04-06 DIAGNOSIS — Z0000 Encounter for general adult medical examination without abnormal findings: Secondary | ICD-10-CM | POA: Diagnosis not present

## 2020-04-06 DIAGNOSIS — E559 Vitamin D deficiency, unspecified: Secondary | ICD-10-CM | POA: Diagnosis not present

## 2020-04-06 DIAGNOSIS — G2581 Restless legs syndrome: Secondary | ICD-10-CM | POA: Diagnosis not present

## 2020-04-06 DIAGNOSIS — R030 Elevated blood-pressure reading, without diagnosis of hypertension: Secondary | ICD-10-CM

## 2020-04-06 DIAGNOSIS — F419 Anxiety disorder, unspecified: Secondary | ICD-10-CM | POA: Diagnosis not present

## 2020-04-06 DIAGNOSIS — L603 Nail dystrophy: Secondary | ICD-10-CM

## 2020-04-06 DIAGNOSIS — Z114 Encounter for screening for human immunodeficiency virus [HIV]: Secondary | ICD-10-CM

## 2020-04-06 LAB — CBC WITH DIFFERENTIAL/PLATELET
Basophils Absolute: 0 10*3/uL (ref 0.0–0.1)
Basophils Relative: 0.7 % (ref 0.0–3.0)
Eosinophils Absolute: 0.3 10*3/uL (ref 0.0–0.7)
Eosinophils Relative: 4 % (ref 0.0–5.0)
HCT: 41.4 % (ref 36.0–46.0)
Hemoglobin: 14 g/dL (ref 12.0–15.0)
Lymphocytes Relative: 24.5 % (ref 12.0–46.0)
Lymphs Abs: 1.6 10*3/uL (ref 0.7–4.0)
MCHC: 33.7 g/dL (ref 30.0–36.0)
MCV: 92.4 fl (ref 78.0–100.0)
Monocytes Absolute: 0.8 10*3/uL (ref 0.1–1.0)
Monocytes Relative: 12.4 % — ABNORMAL HIGH (ref 3.0–12.0)
Neutro Abs: 3.9 10*3/uL (ref 1.4–7.7)
Neutrophils Relative %: 58.4 % (ref 43.0–77.0)
Platelets: 215 10*3/uL (ref 150.0–400.0)
RBC: 4.48 Mil/uL (ref 3.87–5.11)
RDW: 13.6 % (ref 11.5–15.5)
WBC: 6.6 10*3/uL (ref 4.0–10.5)

## 2020-04-06 LAB — COMPREHENSIVE METABOLIC PANEL
ALT: 18 U/L (ref 0–35)
AST: 22 U/L (ref 0–37)
Albumin: 4.6 g/dL (ref 3.5–5.2)
Alkaline Phosphatase: 101 U/L (ref 39–117)
BUN: 11 mg/dL (ref 6–23)
CO2: 28 mEq/L (ref 19–32)
Calcium: 9.8 mg/dL (ref 8.4–10.5)
Chloride: 103 mEq/L (ref 96–112)
Creatinine, Ser: 0.57 mg/dL (ref 0.40–1.20)
GFR: 109.36 mL/min (ref >60.00)
Glucose, Bld: 98 mg/dL (ref 70–99)
Potassium: 4.2 mEq/L (ref 3.5–5.1)
Sodium: 137 mEq/L (ref 135–145)
Total Bilirubin: 0.4 mg/dL (ref 0.2–1.2)
Total Protein: 7.3 g/dL (ref 6.0–8.3)

## 2020-04-06 LAB — VITAMIN D 25 HYDROXY (VIT D DEFICIENCY, FRACTURES): VITD: 55.05 ng/mL (ref 30.00–100.00)

## 2020-04-06 LAB — LIPID PANEL
Cholesterol: 262 mg/dL — ABNORMAL HIGH (ref 0–200)
HDL: 111.5 mg/dL (ref >39.00)
LDL Cholesterol: 133 mg/dL — ABNORMAL HIGH (ref 0–99)
NonHDL: 150.41
Total CHOL/HDL Ratio: 2
Triglycerides: 85 mg/dL (ref 0.0–149.0)
VLDL: 17 mg/dL (ref 0.0–40.0)

## 2020-04-06 LAB — TSH: TSH: 1.93 u[IU]/mL (ref 0.35–4.50)

## 2020-04-06 LAB — VITAMIN B12: Vitamin B-12: 200 pg/mL — ABNORMAL LOW (ref 211–911)

## 2020-04-06 LAB — FERRITIN: Ferritin: 42 ng/mL (ref 10.0–291.0)

## 2020-04-06 MED ORDER — ROPINIROLE HCL 0.25 MG PO TABS: 0.2500 mg | ORAL_TABLET | Freq: Every day | ORAL | 0 refills | Status: DC

## 2020-04-06 NOTE — Progress Notes (Signed)
Patient: Courtney Watson MRN: [id number] DOB: July 22, 1961 PCP: Orma Flaming, MD      Subjective:  Chief Complaint  Patient presents with  . Annual Exam  . Anxiety  . vitamin D deficiency  . restless leg syndrome    HPI: The patient is a 59 y.o. female who presents today for annual exam. She denies any changes to past medical history. There have been no recent hospitalizations. They are following a well balanced diet but not an exercise plan. Weight has been fluctuating a bit. She complains of fatigue. She also says that her nails don't grow. She also thinks she has restless leg syndrome.   Vitamin D deficiency She is on 50,000 IU/weekly. She has been on this dose for one year. Her Gyn apparently has been managing this.   Anxiety She is followed by Dr. Clovis Pu and is on zoloft 100mg  daily and xanax prn. She has not needed a xanax in the past four months as she only needs at restaruant and hasn't needed since restaruant are not full.   Restless leg syndrome She has never been diagnosed with this, but states he constantly has to move her legs at night. She also feels like she has to get out of bed and go walk away. She also has some tingling on the left leg that just started. She has never tried any medication. It does keep her up and her husband is always telling her her feet moving can keep him awake. She does snore sometimes. She states it has been going on x 2 years. No issues during day and symptoms resolve with walking.     Immunization History  Administered Date(s) Administered  . Influenza,inj,Quad PF,6+ Mos 08/29/2009, 10/08/2010, 11/05/2012, 11/05/2012, 09/21/2013, 11/05/2015, 08/22/2017, 09/27/2017, 08/14/2018  . Influenza-Unspecified 08/29/2009, 10/08/2010, 09/21/2013, 11/05/2015, 07/09/2019  . PFIZER SARS-COV-2 Vaccination 02/06/2020, 02/29/2020  . Td 02/07/2004  . Tdap 03/26/2009, 07/20/2019  . Tetanus 02/07/2004  . Zoster 11/14/2014   Colonoscopy: 01/2017. 5 year  follow up  Mammogram: 06/2019 Pap smear: 03/19/2018 covid shots done.   Review of Systems  Constitutional: Negative for chills, fatigue and fever.  HENT: Negative for dental problem, ear pain, hearing loss and trouble swallowing.   Eyes: Negative for visual disturbance.  Respiratory: Negative for cough, chest tightness and shortness of breath.   Cardiovascular: Negative for chest pain, palpitations and leg swelling.  Gastrointestinal: Negative for abdominal pain, blood in stool, diarrhea, nausea and vomiting.  Endocrine: Negative for cold intolerance, polydipsia, polyphagia and polyuria.  Genitourinary: Negative for dysuria, frequency, hematuria, pelvic pain and urgency.  Musculoskeletal: Negative for arthralgias.  Skin: Negative for rash.  Neurological: Negative for dizziness, light-headedness and headaches.  Psychiatric/Behavioral: Negative for dysphoric mood and sleep disturbance. The patient is not nervous/anxious.     Allergies Patient has No Known Allergies.  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  Surgical History Patient  has a past surgical history that includes Colon surgery (2005); Endometrial ablation (2012); and Myomectomy (2012).  Family  History Pateint's family history  includes Arthritis in her mother; Breast cancer in her paternal grandmother; Cancer in her father and paternal grandfather; Cancer (age of onset: 29) in her maternal uncle; Depression in her maternal grandmother; Diabetes in her maternal grandfather; Hearing loss in her mother; Heart disease in her maternal grandfather; Hyperlipidemia in her father; Hypertension in her father, maternal grandfather, and mother; Osteoarthritis in her father; Osteoporosis in her mother; Parkinson's disease in her father; Rheum arthritis in her father.  Social History Patient  reports that she quit smoking about 8 years ago. Her smoking use included cigarettes. She has never used smokeless tobacco. She reports current alcohol use of about 4.0 standard drinks of alcohol per week. She reports that she does not use drugs.    Objective: Vitals:   04/06/20 0925  BP: (!) 142/70  Pulse: 80  Temp: 98 F (36.7 C)  TempSrc: Temporal  SpO2: 97%  Weight: 166 lb 3.2 oz (75.4 kg)  Height: 5' 7.5" (1.715 m)    Body mass index is 25.65 kg/m.  Physical Exam Vitals reviewed.  Constitutional:      Appearance: Normal appearance. She is well-developed and normal weight.  HENT:     Head: Normocephalic and atraumatic.     Right Ear: Tympanic membrane, ear canal and external ear normal.     Left Ear: Tympanic membrane, ear canal and external ear normal.     Mouth/Throat:     Mouth: Mucous membranes are moist.  Eyes:     Extraocular Movements: Extraocular movements intact.     Conjunctiva/sclera: Conjunctivae normal.     Pupils: Pupils are equal, round, and reactive to light.  Neck:     Thyroid: No thyromegaly.     Vascular: No carotid bruit.     Comments: No enlarged thyroid gland Cardiovascular:     Rate and Rhythm: Normal rate and regular rhythm.     Pulses: Normal pulses.     Heart sounds: Normal heart sounds. No murmur.  Pulmonary:     Effort: Pulmonary effort is normal.     Breath sounds: Normal breath sounds.   Abdominal:     General: Abdomen is flat. Bowel sounds are normal. There is no distension.     Palpations: Abdomen is soft.     Tenderness: There is no abdominal tenderness.  Musculoskeletal:        General: Normal range of motion.     Cervical back: Normal range of motion and neck supple.  Lymphadenopathy:     Cervical: No cervical adenopathy.  Skin:    General: Skin is warm and dry.     Capillary Refill: Capillary refill takes less than 2 seconds.     Findings: No rash.  Neurological:     General: No focal deficit present.     Mental Status: She is alert and oriented to person, place, and time.     Cranial Nerves: No cranial nerve deficit.     Coordination: Coordination normal.     Deep Tendon Reflexes: Reflexes normal.  Psychiatric:        Mood and Affect: Mood normal.        Behavior: Behavior normal.         Office Visit from 04/06/2020 in [facility]  PHQ-2 Total Score  0     GAD 7 : Generalized Anxiety Score 04/06/2020  Nervous, Anxious, on Edge 1  Control/stop worrying 1  Worry too much - different things 2  Trouble relaxing 0  Restless 1  Easily annoyed or irritable 0  Afraid - awful might happen 0  Total GAD 7 Score 5  Anxiety Difficulty Not difficult at all       Assessment/plan: 1. Annual physical exam Routine fasting labs today. HM reviewed and UTD. She is going to start exercising again, encouraged this.  Patient counseling [x]    Nutrition: Stressed importance of moderation in sodium/caffeine intake, saturated fat and cholesterol, caloric balance, sufficient intake of fresh fruits, vegetables, fiber, calcium, iron, and 1 mg of folate supplement per day (for females capable of pregnancy).  [x]    Stressed the importance of regular exercise.   []    Substance Abuse: Discussed cessation/primary prevention of tobacco, alcohol, or other drug use; driving or other dangerous activities under the influence; availability of treatment for  abuse.   [x]    Injury prevention: Discussed safety belts, safety helmets, smoke detector, smoking near bedding or upholstery.   [x]    Sexuality: Discussed sexually transmitted diseases, partner selection, use of condoms, avoidance of unintended pregnancy  and contraceptive alternatives.  [x]    Dental health: Discussed importance of regular tooth brushing, flossing, and dental visits.  [x]    Health maintenance and immunizations reviewed. Please refer to Health maintenance section.    - CBC with Differential/Platelet - Comprehensive metabolic panel - Lipid panel - TSH  2. Anxiety Well controlled and has not needed xanax. Followed by Dr. Clovis Pu.   3. Vitamin D deficiency  - VITAMIN D 25 Hydroxy (Vit-D Deficiency, Fractures)  4. Elevated blood pressure reading Elevated x 2 today. She does admit to eating a lot of salt. +FH of HTN. She has not been exercising. Will give her 3 months to work on exercise/diet and follow up for recheck.   5. Restless leg syndrome Clinically meets criteria for RLS. Checking ferritin and starting requip. Discussed titration and not to go over 1mg . Will f/u in 3 months time. She is to let me know if any issues with medication.  - Ferritin  6. Encounter for screening for HIV  - HIV Antibody (routine testing w rflx)   7. Brittle nails No pathological finding on exam. Reassured her. Checking tsh/b12.  - Vitamin B12    This visit occurred during the SARS-CoV-2 public health emergency.  Safety protocols were in place, including screening questions prior to the visit, additional usage of staff PPE, and extensive cleaning of exam room while observing appropriate contact time as indicated for disinfecting solutions.     Return in about 3 months (around 07/07/2020) for RLS.     Orma Flaming, MD [place]  04/06/2020

## 2020-04-06 NOTE — Patient Instructions (Signed)
I think you have restless leg...starting medication called requip. Will take 1 hour before bed. May titrate up every 7 days by .25mg . do not go over 1mg . See you back in 3 months.   Checking all labs!    Cut out salt. Blood pressure is high. Will recheck in 3 months when you come back!   Good seeing you!  dr. Rogers Blocker

## 2020-04-09 ENCOUNTER — Telehealth: Payer: Self-pay | Admitting: Family Medicine

## 2020-04-09 ENCOUNTER — Encounter: Payer: Self-pay | Admitting: Family Medicine

## 2020-04-09 DIAGNOSIS — E538 Deficiency of other specified B group vitamins: Secondary | ICD-10-CM | POA: Insufficient documentation

## 2020-04-09 LAB — HIV ANTIBODY (ROUTINE TESTING W REFLEX): HIV 1&2 Ab, 4th Generation: NONREACTIVE

## 2020-04-09 NOTE — Telephone Encounter (Signed)
Pt called stating she spoke with her insurance company and they are not able to give approval for B12 injections yet. Pt states the insurance company told her they have put her request on high priority. Pt asked to cancel appts until injections are approved. Pt asked if insurance would contact medical assistant or if they would contact her when the injections are approved. Please advise.

## 2020-04-10 ENCOUNTER — Ambulatory Visit: Payer: 59

## 2020-04-10 ENCOUNTER — Telehealth: Payer: Self-pay

## 2020-04-10 NOTE — Telephone Encounter (Signed)
PA submitted. Awaiting approval.

## 2020-04-10 NOTE — Telephone Encounter (Signed)
I spoke with pt to make her aware of Prior Auth decision.  PA not required at this time. Medication or product is on your plan's  list of covered drugs. Pt verbalized understanding.

## 2020-04-11 ENCOUNTER — Ambulatory Visit: Payer: 59

## 2020-04-12 ENCOUNTER — Ambulatory Visit: Payer: 59

## 2020-04-13 ENCOUNTER — Ambulatory Visit: Payer: 59

## 2020-04-20 ENCOUNTER — Other Ambulatory Visit: Payer: Self-pay | Admitting: Psychiatry

## 2020-04-23 ENCOUNTER — Telehealth: Payer: Self-pay | Admitting: Family Medicine

## 2020-04-23 MED ORDER — CYANOCOBALAMIN 1000 MCG/ML IJ SOLN
INTRAMUSCULAR | 0 refills | Status: DC
Start: 2020-04-23 — End: 2020-07-09

## 2020-04-23 MED ORDER — "SYRINGE/NEEDLE (DISP) 25G X 1"" 3 ML MISC": 1.0000 | Freq: Once | 0 refills | Status: AC

## 2020-04-23 NOTE — Telephone Encounter (Signed)
Spoke with pt to confirm nurse visits for B12 injections. She states that they will be very expensive coming into the office to have them done. She says that she is okay with her husband doing them.

## 2020-04-23 NOTE — Telephone Encounter (Signed)
Sent b12 to [facility] then would have her come in for injections.Orma Flaming, MD [place]

## 2020-04-23 NOTE — Telephone Encounter (Signed)
Sent to pharmacy 

## 2020-04-23 NOTE — Telephone Encounter (Signed)
Pt called stating she needs CMA to send script to [facility] Cyanocobalam 1000 mcg - B12 Injections. Please advise.

## 2020-04-23 NOTE — Addendum Note (Signed)
Addended by: Orma Flaming on: 04/23/2020 03:30 PM   Modules accepted: Orders

## 2020-05-01 ENCOUNTER — Telehealth: Payer: Self-pay | Admitting: Family Medicine

## 2020-05-01 NOTE — Telephone Encounter (Signed)
Patient states that she is going to do the B12  Injections at home and just had a few questions before she did her first one, asked if someone could give her a call.

## 2020-05-01 NOTE — Telephone Encounter (Signed)
I spoke with the pt to recommend that she comes in for a Nurse visit, to have teaching on administering the B12 injection. Pt verbalized understanding and will make the appointment. Please call pt to schedule, Thank You

## 2020-05-01 NOTE — Telephone Encounter (Signed)
Patient is scheduled for Tuesday 05/08/20

## 2020-05-08 ENCOUNTER — Ambulatory Visit: Payer: 59

## 2020-05-19 ENCOUNTER — Other Ambulatory Visit: Payer: Self-pay | Admitting: Psychiatry

## 2020-05-22 NOTE — Telephone Encounter (Signed)
Needs to schedule apt

## 2020-05-29 ENCOUNTER — Other Ambulatory Visit: Payer: Self-pay | Admitting: Psychiatry

## 2020-05-30 NOTE — Telephone Encounter (Signed)
Patient called and said she has been out of her medicine for 3 days and she has made an appt on 08/08/20

## 2020-07-02 LAB — HM MAMMOGRAPHY

## 2020-07-09 ENCOUNTER — Ambulatory Visit: Payer: 59 | Admitting: Family Medicine

## 2020-07-09 ENCOUNTER — Encounter: Payer: Self-pay | Admitting: Family Medicine

## 2020-07-09 ENCOUNTER — Other Ambulatory Visit: Payer: Self-pay

## 2020-07-09 VITALS — BP 154/75 | HR 74 | Temp 98.5°F | Ht 67.5 in | Wt 165.6 lb

## 2020-07-09 DIAGNOSIS — R030 Elevated blood-pressure reading, without diagnosis of hypertension: Secondary | ICD-10-CM | POA: Diagnosis not present

## 2020-07-09 DIAGNOSIS — E538 Deficiency of other specified B group vitamins: Secondary | ICD-10-CM | POA: Diagnosis not present

## 2020-07-09 DIAGNOSIS — G2581 Restless legs syndrome: Secondary | ICD-10-CM | POA: Diagnosis not present

## 2020-07-09 MED ORDER — "SYRINGE/NEEDLE (DISP) 23G X 1"" 3 ML MISC": 1.0000 | 1 refills | Status: DC

## 2020-07-09 MED ORDER — CYANOCOBALAMIN 1000 MCG/ML IJ SOLN: INTRAMUSCULAR | 0 refills | Status: DC

## 2020-07-09 NOTE — Patient Instructions (Addendum)
-  get blood pressure cuff and keep log daily. Sit for 5 minutes, legs uncrossed and take in left arm. email me after a month. If >160/90 email me right away.   F/u in 3 months. Let me know how b12 works with legs too!   Dr. Rogers Blocker

## 2020-07-09 NOTE — Progress Notes (Signed)
Patient: Courtney Watson MRN: [id number] DOB: [birth year] PCP: Orma Flaming, MD     Subjective:  Chief Complaint  Patient presents with  .  Restless leg  . b12 deficiency  . Hypertension    HPI: The patient is a 59 y.o. female who presents today for Restless leg. She says that it subsided while taking B12 injections, now that she has stopped, she feels like the RLS  has come back. She also started the requip, but stopped taking this after starting the b12.   Elevated blood pressure She has stopped salt, hello fresh and really changed her diet. She has not checked her blood pressure at home.   b12 deficiency Finished IM repletion. Switched over to oral, but would prefer to do Im monthly. Needs refill of this ad we need to recheck levels.   Review of Systems  Respiratory: Negative for shortness of breath and wheezing.   Genitourinary: Negative for frequency, pelvic pain and urgency.  Neurological: Negative for dizziness, weakness and headaches.    Allergies Patient has No Known Allergies.  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  Surgical History Patient  has a past surgical history that includes Colon surgery (2005); Endometrial ablation (2012); and Myomectomy  (2012).  Family History Pateint's family history includes Arthritis in her mother; Breast cancer in her paternal grandmother; Cancer in her father and paternal grandfather; Cancer (age of onset: 42) in her maternal uncle; Depression in her maternal grandmother; Diabetes in her maternal grandfather; Hearing loss in her mother; Heart disease in her maternal grandfather; Hyperlipidemia in her father; Hypertension in her father, maternal grandfather, and mother; Osteoarthritis in her father; Osteoporosis in her mother; Parkinson's disease in her father; Rheum arthritis in her father.  Social History Patient  reports that she quit smoking about 8 years ago. Her smoking use included cigarettes. She has never used smokeless tobacco. She reports current alcohol use of about 4.0 standard drinks of alcohol per week. She reports that she does not use drugs.    Objective: Vitals:   07/09/20 1352  BP: (!) 154/75  Pulse: 74  Temp: 98.5 F (36.9 C)  TempSrc: Temporal  SpO2: 97%  Weight: 165 lb 9.6 oz (75.1 kg)  Height: 5' 7.5" (1.715 m)    Body mass index is 25.55 kg/m.  Physical Exam Vitals reviewed.  Constitutional:      Appearance: Normal appearance. She is normal weight.  HENT:     Head: Normocephalic.  Cardiovascular:     Rate and Rhythm: Normal rate and regular rhythm.     Heart sounds: Normal heart sounds.  Pulmonary:     Effort: Pulmonary effort is normal.     Breath sounds: Normal breath sounds.  Abdominal:     General: Abdomen is flat. Bowel sounds are normal.     Palpations: Abdomen is soft.  Skin:    Capillary Refill: Capillary refill takes less than 2 seconds.  Neurological:     General: No focal deficit present.  Mental Status: She is alert and oriented to person, place, and time.  Psychiatric:        Mood and Affect: Mood normal.        Behavior: Behavior normal.        Assessment/plan: 1. RLS (restless legs syndrome) Improved with B12 and wants to hold off on  medication. Didn't like the requip. Will let me know if continues to be relieved with monthly IM injections.   2. B12 deficiency Checking and refilled monthly IM px/syringes.  - Vitamin B12; Future - Vitamin B12  3. Elevated blood pressure reading Second elevated reading. She is going to get cuff and spend the next few weeks taking her pressures. She is to email me in a few weeks with readings and then return in 3 months. EKG done.    This visit occurred during the SARS-CoV-2 public health emergency.  Safety protocols were in place, including screening questions prior to the visit, additional usage of staff PPE, and extensive cleaning of exam room while observing appropriate contact time as indicated for disinfecting solutions.     Return in about 3 months (around 10/09/2020) for blood pressure .  Orma Flaming, MD [place]   07/09/2020

## 2020-07-10 LAB — VITAMIN B12: Vitamin B-12: 564 pg/mL (ref 200–1100)

## 2020-07-17 ENCOUNTER — Other Ambulatory Visit: Payer: Self-pay

## 2020-07-17 ENCOUNTER — Encounter: Payer: Self-pay | Admitting: Family Medicine

## 2020-07-17 NOTE — Telephone Encounter (Signed)
Medication refill request: Vitamin D Last AEX:  07/20/19 Dr. Quincy Simmonds Next AEX: 09/05/20 Last MMG (if hormonal medication request): n/a Refill authorized: today, please advise

## 2020-07-17 NOTE — Telephone Encounter (Signed)
Patient left message regarding refill for vitamin D.

## 2020-07-20 ENCOUNTER — Telehealth: Payer: Self-pay | Admitting: Psychiatry

## 2020-07-20 ENCOUNTER — Other Ambulatory Visit: Payer: Self-pay

## 2020-07-20 MED ORDER — SERTRALINE HCL 100 MG PO TABS: 100.0000 mg | ORAL_TABLET | Freq: Every day | ORAL | 0 refills | Status: DC

## 2020-07-20 NOTE — Telephone Encounter (Signed)
Pt called to request refill for Zoloft. Has apt 9/22 soonest apt available. She will be leaving on trip for 15 days. Leaving 9/5 return 9/16. She has 9 pills and will run out before return. [facility]acy [facility].

## 2020-07-20 NOTE — Telephone Encounter (Signed)
Refill sent with note okay to fill today 07/20/2020

## 2020-07-26 ENCOUNTER — Other Ambulatory Visit: Payer: Self-pay | Admitting: Psychiatry

## 2020-07-26 NOTE — Telephone Encounter (Signed)
Please review

## 2020-08-03 ENCOUNTER — Other Ambulatory Visit: Payer: Self-pay

## 2020-08-03 DIAGNOSIS — E559 Vitamin D deficiency, unspecified: Secondary | ICD-10-CM

## 2020-08-03 NOTE — Telephone Encounter (Signed)
Medication refill request: Vitamin D 50000iu  Last AEX:  07/20/19 Next AEX: 09/05/20 Last MMG (if hormonal medication request): Na Refill authorized: 12/0

## 2020-08-08 ENCOUNTER — Ambulatory Visit (INDEPENDENT_AMBULATORY_CARE_PROVIDER_SITE_OTHER): Payer: 59 | Admitting: Psychiatry

## 2020-08-08 ENCOUNTER — Other Ambulatory Visit: Payer: Self-pay

## 2020-08-08 ENCOUNTER — Encounter: Payer: Self-pay | Admitting: Psychiatry

## 2020-08-08 DIAGNOSIS — F40233 Fear of injury: Secondary | ICD-10-CM | POA: Diagnosis not present

## 2020-08-08 DIAGNOSIS — F410 Panic disorder [episodic paroxysmal anxiety] without agoraphobia: Secondary | ICD-10-CM

## 2020-08-08 MED ORDER — SERTRALINE HCL 100 MG PO TABS: 100.0000 mg | ORAL_TABLET | Freq: Every day | ORAL | 3 refills | Status: DC

## 2020-08-08 MED ORDER — ALPRAZOLAM 0.5 MG PO TABS: 0.5000 mg | ORAL_TABLET | Freq: Three times a day (TID) | ORAL | 1 refills | Status: DC | PRN

## 2020-08-08 NOTE — Progress Notes (Signed)
Courtney Watson [id number] Oct 29, 1961 59 y.o.  Subjective:   Patient ID:  Courtney Watson is a 59 y.o. (DOB 1961-09-25) female.  Chief Complaint:  Chief Complaint  Patient presents with   Follow-up    Medication Management               Anxiety    HPI Courtney Watson presents to the office today for follow-up of anxiety.  Last seen March 2020.  Doing well.  No Covid.  Sister and B in law sick with it but recovered. Vaccinated.  Cont sertraline 100 and about 10 Xanax/month. Episodically anxious a little more with Covid.  Had been able to eat in restaurants at times without Xanax.  Really good with anxiety.  Couple occassions able to eat without Xanax.  Driving in [place] without anxiety. Xanax works if needed. No spontaneous panic attacks lately.  Can trigger herself to feel throat close up if talks.  Review of Systems:  Review of Systems  Cardiovascular: Negative for chest pain.  Neurological: Negative for tremors and weakness.    Medications: I have reviewed the patient's current medications.  Current Outpatient Medications  Medication Sig Dispense Refill   ALPRAZolam (XANAX) 0.5 MG tablet Take 1 tablet (0.5 mg total) by mouth 3 (three) times daily as needed. 90 tablet 1   Calcium Carbonate (CALCIUM 500 PO) Take 1 tablet by mouth 2 (two) times daily.     cyanocobalamin (,VITAMIN B-12,) 1000 MCG/ML injection Inject 99mL into muscle q 30 days 10 mL 0   OVER THE COUNTER MEDICATION Ibgard--Takes 1 tablet tid     sertraline (ZOLOFT) 100 MG tablet Take 1 tablet (100 mg total) by mouth daily. 90 tablet 3   SYRINGE-NEEDLE, DISP, 3 ML 23G X 1" 3 ML MISC 1 Device by Does not apply route every 30 (thirty) days. 50 each 1   Vitamin D, Ergocalciferol, (DRISDOL) 1.25 MG (50000 UT) CAPS capsule Take 1 capsule (50,000 Units total) by mouth every 7 (seven) days. 12 capsule 3   No current facility-administered medications for this visit.    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   Hypertension Father    Hyperlipidemia Father    Osteoarthritis Father    Rheum arthritis Father    Cancer Maternal Uncle 93       colon cancer   Depression Maternal Grandmother    Diabetes Maternal Grandfather    Heart disease Maternal Grandfather    Hypertension Maternal Grandfather    Breast cancer Paternal Grandmother    Cancer Paternal Grandfather     Social History   Socioeconomic History   Marital status: Married    Spouse name: Not on file   Number of children: Not on file   Years of education: Not on file   Highest education level: Not on file  Occupational History   Not on file  Tobacco Use   Smoking status: Former Smoker    Types: Cigarettes     Quit date: 11/18/2011    Years since quitting: 8.7   Smokeless tobacco: Never Used  Vaping Use   Vaping Use: Every day   Start date: 11/18/2011  Substance and Sexual Activity   Alcohol use: Yes    Alcohol/week: 4.0 standard drinks    Types: 4 Cans of beer per week    Comment: on the weekends   Drug use: No   Sexual activity: Yes    Partners: Male    Birth control/protection: Post-menopausal    Comment: Ablation 2012  Other Topics Concern   Not on file  Social History Narrative   Not on file   Social Determinants of Health   Financial Resource Strain:    Difficulty of Paying Living Expenses: Not on file  Food Insecurity:    Worried About Charity fundraiser in the Last Year: Not on file   [employer] of Food in the Last Year: Not on file  Transportation Needs:    Lack of Transportation (Medical): Not on file   Lack of Transportation (Non-Medical): Not on file  Physical Activity:    Days of Exercise per Week: Not on file   Minutes of Exercise per Session: Not on file  Stress:    Feeling of Stress : Not on file  Social Connections:    Frequency of Communication with Friends and Family: Not on file   Frequency of Social Gatherings with Friends and Family: Not on file   Attends Religious Services: Not on file   Active Member of Clubs or Organizations: Not on file   Attends Archivist Meetings: Not on file   Marital Status: Not on file  Intimate Partner Violence:    Fear of Current or Ex-Partner: Not on file   Emotionally Abused: Not on file   Physically Abused: Not on file   Sexually Abused: Not on file    Past Medical History, Surgical history, Social history, and Family history were reviewed and updated as appropriate.   Please see review of systems for further details on the patient's review from today.   Objective:   Physical Exam:  There were no vitals taken for this visit.  Physical Exam Constitutional:      General: She is not  in acute distress.    Appearance: She is well-developed.  Neurological:     Mental Status: She is alert and oriented to person, place, and time.     Cranial Nerves: No dysarthria.  Psychiatric:        Attention and Perception: She is attentive. She  does not perceive auditory or visual hallucinations.        Mood and Affect: Mood is anxious. Mood is not depressed. Affect is not labile, blunt, angry or inappropriate.        Speech: Speech normal.        Behavior: Behavior normal.        Thought Content: Thought content normal. Thought content does not include homicidal or suicidal ideation. Thought content does not include homicidal or suicidal plan.        Cognition and Memory: Cognition normal.        Judgment: Judgment normal.     Comments: Insight intact. No auditory or visual hallucinations. No delusions.  She still has some anxiety around eating and requires the Xanax but it is manageable and much less severe than in the past.     Lab Review:     Component Value Date/Time   NA 137 04/06/2020 1007   NA 142 07/20/2019 1624   K 4.2 04/06/2020 1007   CL 103 04/06/2020 1007   CO2 28 04/06/2020 1007   GLUCOSE 98 04/06/2020 1007   BUN 11 04/06/2020 1007   BUN 12 07/20/2019 1624   CREATININE 0.57 04/06/2020 1007   CREATININE 0.59 03/18/2017 1506   CALCIUM 9.8 04/06/2020 1007   PROT 7.3 04/06/2020 1007   PROT 7.1 07/20/2019 1624   ALBUMIN 4.6 04/06/2020 1007   ALBUMIN 4.7 07/20/2019 1624   AST 22 04/06/2020 1007   ALT 18 04/06/2020 1007   ALKPHOS 101 04/06/2020 1007   BILITOT 0.4 04/06/2020 1007   BILITOT 0.2 07/20/2019 1624   GFRNONAA 109 07/20/2019 1624   GFRAA 126 07/20/2019 1624       Component Value Date/Time   WBC 6.6 04/06/2020 1007   RBC 4.48 04/06/2020 1007   HGB 14.0 04/06/2020 1007   HGB 12.5 07/20/2019 1624   HCT 41.4 04/06/2020 1007   HCT 38.8 07/20/2019 1624   PLT 215.0 04/06/2020 1007   PLT 250 07/20/2019 1624   MCV 92.4 04/06/2020 1007   MCV 93  07/20/2019 1624   MCH 30.0 07/20/2019 1624   MCH 31.2 03/18/2017 1506   MCHC 33.7 04/06/2020 1007   RDW 13.6 04/06/2020 1007   RDW 12.4 07/20/2019 1624   LYMPHSABS 1.6 04/06/2020 1007   MONOABS 0.8 04/06/2020 1007   EOSABS 0.3 04/06/2020 1007   BASOSABS 0.0 04/06/2020 1007    No results found for: POCLITH, LITHIUM   No results found for: PHENYTOIN, PHENOBARB, VALPROATE, CBMZ   .res Assessment: Plan:    Panic disorder without agoraphobia - Plan: ALPRAZolam (XANAX) 0.5 MG tablet, sertraline (ZOLOFT) 100 MG tablet  Fear of choking - Plan: ALPRAZolam (XANAX) 0.5 MG tablet   Courtney Watson has a history of panic disorder and a fear of choking.  It has been extreme and caused a great deal of avoidance in the past she would avoid eating out.  Now she can eat out although she does have to take half of 0.5 mg Xanax in order to do so.  She gets overall antipanic effects from the sertraline. Continue sertraline 100 and prn Xnax.  High relapse risk without meds.  No med changes today  Follow-up 1 year  We discussed the short-term risks associated with benzodiazepines including sedation and increased fall risk among others.  Discussed long-term side effect risk including dependence, potential withdrawal symptoms.  Lynder Parents, MD, DFAPA   Please see After Visit Summary for patient specific instructions.  Future Appointments  Date  Time Provider [place]  08/31/2020  3:20 PM Shamleffer, Melanie Crazier, MD [facility] None  09/05/2020  9:30 AM Nunzio Cobbs, MD [place] None  10/15/2020  2:00 PM Orma Flaming, MD [facility]    No orders of the defined types were placed in this encounter.     -------------------------------

## 2020-08-15 ENCOUNTER — Other Ambulatory Visit: Payer: Self-pay | Admitting: *Deleted

## 2020-08-15 NOTE — Telephone Encounter (Signed)
Medication refill request: Vitamin D  Last AEX:  07-20-2019 BS Next AEX: 09-05-20  Last MMG (if hormonal medication request): n/a Refill authorized: Today, please advise.   Medication pended for #4, 0RF. Please refill if appropriate.

## 2020-08-31 ENCOUNTER — Encounter: Payer: Self-pay | Admitting: Internal Medicine

## 2020-08-31 ENCOUNTER — Ambulatory Visit (INDEPENDENT_AMBULATORY_CARE_PROVIDER_SITE_OTHER): Payer: 59 | Admitting: Internal Medicine

## 2020-08-31 ENCOUNTER — Other Ambulatory Visit: Payer: Self-pay

## 2020-08-31 VITALS — BP 142/82 | HR 61 | Temp 97.6°F | Ht 67.5 in | Wt 165.5 lb

## 2020-08-31 DIAGNOSIS — E041 Nontoxic single thyroid nodule: Secondary | ICD-10-CM

## 2020-08-31 NOTE — Patient Instructions (Signed)
-   Stop by the lab today please

## 2020-08-31 NOTE — Progress Notes (Signed)
Name: Courtney Watson  MRN/ DOB: [id number], 05-11-1961    Age/ Sex: 59 y.o., female     PCP: Orma Flaming, MD   Reason for Endocrinology Evaluation:  Left thyroid nodule      Initial Endocrinology Clinic Visit: 08/26/2019    PATIENT IDENTIFIER: Courtney Watson is a 59 y.o., female with a past medical history of Anxiety and Hx of colon cancer ( S/p resection 2005 ). She has followed with [facility] since 08/26/2019 for consultative assistance with management of her left thyroid nodule.   HISTORICAL SUMMARY:  Courtney Watson was noted to have a left thyroid nodule on exam at her Gyn physical in 2020. She has noted some fullness over the past few months, this has been stable.   No FH of thyroid nodule   Two maternal uncles with colon cancer  SUBJECTIVE:    Today (08/31/2020):  Courtney Watson is here for a left thyroid nodule . Denies any local neck symptoms, such as pain or dysphagia    Has noted weight gain  Has occasional palpitations - has a f/u with cardiology    HISTORY:  Past Medical History:  Past Medical History:  Diagnosis Date  . Abnormal uterine bleeding    when she had fibroid tumor  . Anxiety   . Blood in stool   . 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   Overview:  Clinically Left C4 Clinically Left C4  . Cancer [facility]) 2005   colon   . Cervical radiculopathy 01/10/2015  . Colon polyps   . Eating disorder   . Fatigue 01/25/2018  . Fibroid   . H/O rhinoplasty   . History of chicken pox   . Irregular heart beat 04/01/2018  . Knee pain, right 08/22/2015  . Left thyroid nodule    needs follow up ultrasound in 2021  . 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 Malignant tumor of colon ([place]) 04/01/2018  . Neck pain 09/12/2014  . Osteopenia 2018   hips and spine  . Palpitations 04/01/2018  . Perimenopausal disorder 04/01/2018  . Seasonal allergies   . Shortness of breath on exertion 06/08/2017  . Ulcerative colitis ([place])     . Uterine leiomyoma 04/01/2018  . Vitamin D deficiency    Past Surgical History:  Past Surgical History:  Procedure Laterality Date  . COLON SURGERY  2005   colon cancer--Dr.Rosenbower  . ENDOMETRIAL ABLATION  2012   Dr.Lomax  . MYOMECTOMY  2012   Dr.Lomax    Social History:  reports that she quit smoking about 8 years ago. Her smoking use included cigarettes. She has never used smokeless tobacco. She reports current alcohol use of about 4.0 standard drinks of alcohol per week. She reports that she does not use drugs. Family History:  Family History  Problem Relation Age of Onset  . Osteoporosis Mother   . Hypertension Mother   . Arthritis Mother   . Hearing loss Mother   . Cancer Father        prostate  . Parkinson's disease Father        Dec age 87 from parkinsons/Lewey Body Dementia?  Marland Kitchen Hypertension Father   . Hyperlipidemia Father   . Osteoarthritis Father   . Rheum arthritis Father   . Cancer Maternal Uncle 45       colon cancer  . Depression Maternal Grandmother   . Diabetes Maternal Grandfather   . Heart disease Maternal Grandfather   . Hypertension Maternal Grandfather   .  Breast cancer Paternal Grandmother   . Cancer Paternal Grandfather      HOME MEDICATIONS: Allergies as of 08/31/2020   No Known Allergies     Medication List       Accurate as of August 31, 2020  3:57 PM. If you have any questions, ask your nurse or doctor.        ALPRAZolam 0.5 MG tablet Commonly known as: XANAX Take 1 tablet (0.5 mg total) by mouth 3 (three) times daily as needed.   CALCIUM 500 PO Take 1 tablet by mouth 2 (two) times daily.   cyanocobalamin 1000 MCG/ML injection Commonly known as: (VITAMIN B-12) Inject 25mL into muscle q 30 days   OVER THE COUNTER MEDICATION Ibgard--Takes 1 tablet tid   sertraline 100 MG tablet Commonly known as: ZOLOFT Take 1 tablet (100 mg total) by mouth daily.   SYRINGE-NEEDLE (DISP) 3 ML 23G X 1" 3 ML Misc 1 Device by Does not  apply route every 30 (thirty) days.   Vitamin D (Ergocalciferol) 1.25 MG (50000 UNIT) Caps capsule Commonly known as: DRISDOL Take 1 capsule (50,000 Units total) by mouth every 7 (seven) days.         OBJECTIVE:   PHYSICAL EXAM: VS: BP (!) 142/82   Pulse 61   Temp 97.6 F (36.4 C) (Temporal)   Ht 5' 7.5" (1.715 m)   Wt 165 lb 8 oz (75.1 kg)   SpO2 98%   BMI 25.54 kg/m    EXAM: General: Pt appears well and is in NAD  Neck: General: Supple without adenopathy. Thyroid: Thyroid size normal.  No goiter or nodules appreciated.   Lungs: Clear with good BS bilat with no rales, rhonchi, or wheezes  Heart: Auscultation: RRR.  Abdomen: Normoactive bowel sounds, soft, nontender, without masses or organomegaly palpable  Extremities:  BL LE: No pretibial edema normal ROM and strength.  Mental Status: Judgment, insight: Intact Orientation: Oriented to time, place, and person Memory: Intact for recent and remote events Mood and affect: No depression, anxiety, or agitation     DATA REVIEWED: Results for APRYL, BRYMER (MRN [id number]) as of 09/03/2020 10:46  Ref. Range 08/31/2020 15:54  TSH Latest Ref Range: 0.40 - 4.50 mIU/L 2.06      ASSESSMENT / PLAN / RECOMMENDATIONS:   1. Left Thyroid Nodule :  - No local neck symptoms  - Clinically and biochemically euthyroid  - No indication for FNA of the left thyroid nodule at this time, will repeat Ultrasound now     F/U in 1 yr     Signed electronically by: Mack Guise, MD  [facility]  [facility] [address] [place],  [address] Phone: [phone] FAX: [phone]      CC: Orma Flaming, [place] [address] Phone: [phone]  Fax: [phone]   Return to Endocrinology clinic as below: Future Appointments  Date Time Provider [place]  09/05/2020  9:30 AM Nunzio Cobbs, MD [place] None  10/15/2020   2:00 PM Orma Flaming, MD [facility]  11/02/2020  4:00 PM Richardo Priest, MD [facility] None  08/08/2021  3:30 PM Cottle, Billey Co., MD [facility] None  09/06/2021  3:20 PM Detrich Rakestraw, Melanie Crazier, MD [facility] None

## 2020-09-01 LAB — TSH: TSH: 2.06 mIU/L (ref 0.40–4.50)

## 2020-09-04 NOTE — Progress Notes (Signed)
59 y.o. G42P0000 Married Caucasian female here for annual exam.    Denies vaginal bleeding or pelvic pain.  No concerns about sexual functioning. Occasional lower abdominal burning feeling.    Monitoring her blood pressure with her PCP and will follow up in November.   Vit D 55 on 04/06/20 with PCP.   Sees Dr. Kelton Pillar for her thyroid and has another ultrasound next week.   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   Received her Covid vaccine booster and flu vaccine.   PCP:  Orma Flaming, MD  No LMP recorded. Patient has had an ablation.           Sexually active: Yes.    The current method of family planning is Ablation/post menopausal status.    Exercising: Yes.    walking Smoker:  no  Health Maintenance: Pap: 03-19-18 Neg:Neg HR HPV, 10/2015 Neg:Neg HR HPV, 03-10-12 Neg History of abnormal Pap:  no MMG:  07-02-20 3D/Neg/density C/BiRads1 Colonoscopy: 2018 normal per patient;next due 2023 due to family hx and personal hx of colon polyps.  Dr. [facility]. BMD: 06-27-19 Result :Osteopenia, multiple sites TDaP: 07-20-19 Gardasil:   no HIV:04-06-20 NR Hep C:2017 Neg Screening Labs:  PCP.    reports that she quit smoking about 8 years ago. Her smoking use included cigarettes. She has never used smokeless tobacco. She reports current alcohol use of about 4.0 standard drinks of alcohol per week. She reports that she does not use drugs.  Past Medical History:  Diagnosis Date  . Abnormal uterine bleeding    when she had fibroid tumor  . Anxiety   . Blood in stool   . 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   Overview:  Clinically Left C4 Clinically Left C4  . Cancer [facility]) 2005   colon   . Cervical radiculopathy 01/10/2015  . Colon polyps   . Eating disorder   . Fatigue 01/25/2018  . Fibroid   . H/O rhinoplasty   . History of chicken pox   . Irregular heart beat 04/01/2018  . Knee pain, right 08/22/2015  . Left thyroid nodule    needs follow up ultrasound in 2021  .  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 Malignant tumor of colon ([place]) 04/01/2018  . Neck pain 09/12/2014  . Osteopenia 2018   hips and spine  . Palpitations 04/01/2018  . Perimenopausal disorder 04/01/2018  . Seasonal allergies   . Shortness of breath on exertion 06/08/2017  . Ulcerative colitis ([place])   . Uterine leiomyoma 04/01/2018  . Vitamin D deficiency     Past Surgical History:  Procedure Laterality Date  . COLON SURGERY  2005   colon cancer--Dr.Rosenbower  . ENDOMETRIAL ABLATION  2012   Dr.Lomax  . MYOMECTOMY  2012   Dr.Lomax    Current Outpatient Medications  Medication Sig Dispense Refill  . ALPRAZolam (XANAX) 0.5 MG tablet Take 1 tablet (0.5 mg total) by mouth 3 (three) times daily as needed. 90 tablet 1  . Calcium Carbonate (CALCIUM 500 PO) Take 1 tablet by mouth 2 (two) times daily.    . Cholecalciferol (VITAMIN D3) 125 MCG (5000 UT) CAPS Take 1 capsule by mouth daily.    . cyanocobalamin (,VITAMIN B-12,) 1000 MCG/ML injection Inject 53mL into muscle q 30 days 10 mL 0  . OVER THE COUNTER MEDICATION Ibgard--Takes 1 tablet tid    . sertraline (ZOLOFT) 100 MG tablet Take 1 tablet (100 mg total) by mouth daily. 90 tablet 3  . SYRINGE-NEEDLE,  DISP, 3 ML 23G X 1" 3 ML MISC 1 Device by Does not apply route every 30 (thirty) days. 50 each 1   No current facility-administered medications for this visit.    Family History  Problem Relation Age of Onset  . Osteoporosis Mother   . Hypertension Mother   . Arthritis Mother   . Hearing loss Mother   . Cancer Father        prostate  . Parkinson's disease Father        Dec age 35 from parkinsons/Lewey Body Dementia?  Marland Kitchen Hypertension Father   . Hyperlipidemia Father   . Osteoarthritis Father   . Rheum arthritis Father   . Cancer Maternal Uncle 45       colon cancer  . Depression Maternal Grandmother   . Diabetes Maternal Grandfather   . Heart disease Maternal Grandfather   . Hypertension Maternal  Grandfather   . Breast cancer Paternal Grandmother   . Cancer Paternal Grandfather     Review of Systems  All other systems reviewed and are negative.   Exam:   BP (!) 150/92   Pulse 86   Ht 5\' 7"  (1.702 m)   Wt 165 lb (74.8 kg)   SpO2 99%   BMI 25.84 kg/m     General appearance: alert, cooperative and appears stated age Head: normocephalic, without obvious abnormality, atraumatic Neck: no adenopathy, supple, symmetrical, trachea midline and thyroid normal to inspection and palpation Lungs: clear to auscultation bilaterally Breasts: normal appearance, no masses or tenderness, No nipple retraction or dimpling, No nipple discharge or bleeding, No axillary adenopathy Heart: regular rate and extra heart beat, S3? Abdomen: soft, non-tender; no masses, no organomegaly.  Umbilical hernia 2 cm, nontender. Extremities: extremities normal, atraumatic, no cyanosis or edema Skin: skin color, texture, turgor normal. No rashes or lesions Lymph nodes: cervical, supraclavicular, and axillary nodes normal. Neurologic: grossly normal  Pelvic: External genitalia:  no lesions              No abnormal inguinal nodes palpated.              Urethra:  normal appearing urethra with no masses, tenderness or lesions              Bartholins and Skenes: normal                 Vagina: normal appearing vagina with normal color and discharge, no lesions              Cervix: no lesions              Pap taken: No. Bimanual Exam:  Uterus:  normal size, contour, position, consistency, mobility, non-tender              Adnexa: no mass, fullness, tenderness              Rectal exam: Yes.  .  Confirms.              Anus:  normal sphincter tone, no lesions  Chaperone was present for exam.  Assessment:   Well woman visit with normal exam. Status post endometrial ablation and myomectomy.  Small umbilical hernia. Hx osteopenia of hips and spine.  Low risk for fracture. Hx vit D deficiency.  Personal hx colon  cancerand collagenous colitis.  Plan: Mammogram screening discussed. Self breast awareness reviewed. Pap and HR HPV as above. Guidelines for Calcium, Vitamin D, regular exercise program including cardiovascular and weight bearing exercise. BMD next  year.  Follow up annually and prn.

## 2020-09-05 ENCOUNTER — Other Ambulatory Visit: Payer: Self-pay

## 2020-09-05 ENCOUNTER — Encounter: Payer: Self-pay | Admitting: Obstetrics and Gynecology

## 2020-09-05 ENCOUNTER — Ambulatory Visit (INDEPENDENT_AMBULATORY_CARE_PROVIDER_SITE_OTHER): Payer: 59 | Admitting: Obstetrics and Gynecology

## 2020-09-05 VITALS — BP 150/92 | HR 86 | Ht 67.0 in | Wt 165.0 lb

## 2020-09-05 DIAGNOSIS — Z01419 Encounter for gynecological examination (general) (routine) without abnormal findings: Secondary | ICD-10-CM | POA: Diagnosis not present

## 2020-09-05 NOTE — Patient Instructions (Signed)

## 2020-09-10 ENCOUNTER — Encounter: Payer: Self-pay | Admitting: Family Medicine

## 2020-09-10 ENCOUNTER — Ambulatory Visit (INDEPENDENT_AMBULATORY_CARE_PROVIDER_SITE_OTHER): Payer: 59 | Admitting: Family Medicine

## 2020-09-10 ENCOUNTER — Other Ambulatory Visit: Payer: Self-pay

## 2020-09-10 VITALS — BP 142/90 | HR 89 | Temp 97.2°F | Ht 67.0 in | Wt 166.4 lb

## 2020-09-10 DIAGNOSIS — R030 Elevated blood-pressure reading, without diagnosis of hypertension: Secondary | ICD-10-CM | POA: Diagnosis not present

## 2020-09-10 DIAGNOSIS — L980 Pyogenic granuloma: Secondary | ICD-10-CM

## 2020-09-10 DIAGNOSIS — R1031 Right lower quadrant pain: Secondary | ICD-10-CM

## 2020-09-10 MED ORDER — METHYLPREDNISOLONE ACETATE 80 MG/ML IJ SUSP
80.0000 mg | Freq: Once | INTRAMUSCULAR | Status: AC
  Administered 2020-09-10: 80 mg via INTRAMUSCULAR

## 2020-09-10 MED ORDER — CYCLOBENZAPRINE HCL 5 MG PO TABS: 5.0000 mg | ORAL_TABLET | Freq: Three times a day (TID) | ORAL | 1 refills | Status: DC | PRN

## 2020-09-10 NOTE — Progress Notes (Signed)
Patient: Courtney Watson MRN: [id number] DOB: [birth year] PCP: Orma Flaming, MD     Subjective:  Chief Complaint  Patient presents with  . Groin Pain    HPI: The patient is a 59 y.o. female who presents today for groin pain, starting Friday. Located on the left side. She states she got up Friday morning and progressed to the point she couldn't walk over the weekend. She states on Wednesday she did go see the gynecologist and had to get in the stirrups. No other precipitating event. Denies any exercise or change in her routine. Radiation of pain down the top of the left leg (anterior) into the top of her foot and it felt pulsating at times.  Pain rated as a 5/10 right now and at it's worse 10/10 and described as sharp or like a knife was in her groin. Motrin does help some. She has pain with any weight bearing activities, bending down or twisting. If she does a warrior 3 type yoga pose, she is able to bend over. She can not put her leg over her other leg (crossing legs). It has improved since last weekend.   Elevated blood pressure.  She did get a cuff and  Her readings have been pretty good. She is consistently under 140/90.    Lesion on right lateral lower leg. Red and raised. Shaved over it and has been bleeding. Just recently popped up. ?if growing. No pain.    Review of Systems  Constitutional: Negative for chills and fever.  Gastrointestinal: Negative for abdominal pain and nausea.  Genitourinary: Negative for pelvic pain and vaginal pain.  Musculoskeletal: Positive for gait problem and myalgias. Negative for back pain.  Skin: Positive for wound.  Neurological: Negative for facial asymmetry, weakness and numbness.    Allergies Patient has No Known Allergies.  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  Surgical History Patient  has a past surgical history that includes Colon surgery (2005); Endometrial ablation (2012); and Myomectomy (2012).  Family History Pateint's family history includes Arthritis in her mother; Breast cancer in her paternal grandmother; Cancer in her father and paternal grandfather; Cancer (age of onset: 2) in her maternal uncle; Depression in her maternal grandmother; Diabetes in her maternal grandfather; Hearing loss in her mother; Heart disease in her maternal grandfather; Hyperlipidemia in her father; Hypertension in her father, maternal grandfather, and mother; Osteoarthritis in her father; Osteoporosis in her mother; Parkinson's disease in her father; Rheum arthritis in her father.  Social History Patient  reports that she quit smoking about 8 years ago. Her smoking use included cigarettes. She has never used smokeless tobacco. She reports current alcohol use of about 4.0 standard drinks of alcohol per week. She reports that she does not use drugs.    Objective: Vitals:   09/10/20 1300  BP: (!) 142/90  Pulse: 89  Temp: (!) 97.2 F (36.2 C)  TempSrc: Temporal  SpO2: 98%  Weight: 166 lb 6.4 oz (75.5 kg)  Height: 5\' 7"  (  1.702 m)    Body mass index is 26.06 kg/m.  Physical Exam Vitals reviewed.  Constitutional:      Appearance: Normal appearance. She is normal weight.     Comments: In pain with groin   HENT:     Head:  Normocephalic and atraumatic.  Musculoskeletal:        General: Tenderness (left groin down into psoas muscle. no erythema or edema ) present.     Comments: Negative straight leg tests bilaterally. Strength intact. Sensation intact. Gait with a limp  Skin:    Findings: Lesion (right lower leg: 59mm erythematous raised lesion. very faint bleeding) present.  Neurological:     General: No focal deficit present.     Mental Status: She is alert and oriented to person, place, and time.     Sensory: No sensory deficit.     Motor: No weakness.     Deep Tendon Reflexes: Reflexes normal.  Psychiatric:        Mood and Affect: Mood normal.        Behavior: Behavior normal.        Assessment/plan: 1. Right inguinal pain Psoas injury/strain vs. Ligamental injury. No signs of infection and has improved from onset. Will treat like strain. Steroid shot today, muscle relaxer, NSAIDs and heat. If not getting better over next few days discussed she is to let me know by Thursday so we can image vs. Send to sports meds. Any fever/chills, redness, worsening pain: ER.  - methylPREDNISolone acetate (DEPO-MEDROL) injection 80 mg  2. Pyogenic granuloma of skin Unsure if she shaved over it. Discussed typically pyogenic granulomas are fast growing. She will watch and let me know as this will need to be excised.   3. Elevated blood pressure reading Home log has on average been to goal. Continue to monitor.     This visit occurred during the SARS-CoV-2 public health emergency.  Safety protocols were in place, including screening questions prior to the visit, additional usage of staff PPE, and extensive cleaning of exam room while observing appropriate contact time as indicated for disinfecting solutions.     Return if symptoms worsen or fail to improve.    Orma Flaming, MD [place]   09/10/2020

## 2020-09-10 NOTE — Patient Instructions (Addendum)
Ligament/muscle pain -steroid shot today -continue motrin 600-800mg  three times a day as needed /heating pad -muscle relaxer sent in prn. Cautions drowsiness.   -if not getting better we need to image, let me know by end of week.    Pyogenic Granuloma Pyogenic granuloma is a growth (lesion) that forms on the skin or on the mucous membranes of the mouth. This type of growth is a lump of very red tissue that bleeds easily. A pyogenic granuloma is usually a single lesion that most often affects:  The head and neck.  The mucous membranes of the mouth or tongue.  The upper body.  The hands and feet. A pyogenic granuloma usually measures about 0.5 inch (1.3 cm), but lesions can be smaller or larger. This condition does not spread from person to person (is not contagious). The lesion is not cancerous (benign). What are the causes? A pyogenic granuloma results from a reaction of your skin or mucous membranes. The reaction causes a mound of tiny blood vessels (capillaries) to form a lesion. This often happens after a minor injury, like pricking your skin or biting your lip or tongue. Sometimes it occurs without an injury. The exact cause of the reaction is not known. What increases the risk? The condition is more likely to develop in:  Pregnant women.  Children and young adults.  People who take certain medicines, especially acne treatment drugs, birth control pills, and some medicines used to treat cancer or HIV/AIDS. What are the signs or symptoms? The main symptom of this condition is a raised or lumpy lesion that is very red. The lesion may also:  Have a crusty, ulcerated surface.  Bleed easily.  Be slightly sore. How is this diagnosed? This condition is diagnosed based on your symptoms and medical history, especially if you recently had an injury. Your health care provider will also do a physical exam. Your health care provider may remove a small piece of the granuloma for testing  (biopsy) to rule out cancer. How is this treated? A small lesion may go away without treatment. You may have to stop or change any medicines that caused the lesion. Pyogenic granulomas caused by pregnancy usually go away after delivery. If your legion is large, irritated, or bleeds easily, you may need to have the lesion removed. This may involve:  Scraping away the lesion (curettage).  Using chemicals or electric energy to destroy the lesion.  Removing the lesion along with a small piece of normal skin or mucous membrane (surgical excision). This is the best treatment to prevent the lesion from coming back. Follow these instructions at home:  Take over-the-counter and prescription medicines only as told by your health care provider.  Keep all follow-up visits as told by your health care provider. This is important. Contact a health care provider if:  You have a fever.  Your lesion bleeds.  Your lesion comes back after treatment. This information is not intended to replace advice given to you by your health care provider. Make sure you discuss any questions you have with your health care provider. Document Revised: 06/26/2016 Document Reviewed: 11/18/2015 Elsevier Patient Education  2020 [employer].

## 2020-09-11 ENCOUNTER — Ambulatory Visit
Admission: RE | Admit: 2020-09-11 | Discharge: 2020-09-11 | Disposition: A | Discharge status: Home / Self Care | Payer: 59 | Source: Ambulatory Visit | Attending: Internal Medicine | Admitting: Internal Medicine

## 2020-09-11 DIAGNOSIS — E041 Nontoxic single thyroid nodule: Secondary | ICD-10-CM

## 2020-09-23 ENCOUNTER — Other Ambulatory Visit: Payer: Self-pay | Admitting: Psychiatry

## 2020-09-23 DIAGNOSIS — F410 Panic disorder [episodic paroxysmal anxiety] without agoraphobia: Secondary | ICD-10-CM

## 2020-09-28 ENCOUNTER — Other Ambulatory Visit: Payer: Self-pay | Admitting: Psychiatry

## 2020-09-28 DIAGNOSIS — F410 Panic disorder [episodic paroxysmal anxiety] without agoraphobia: Secondary | ICD-10-CM

## 2020-10-03 ENCOUNTER — Other Ambulatory Visit: Payer: Self-pay | Admitting: Psychiatry

## 2020-10-03 DIAGNOSIS — F410 Panic disorder [episodic paroxysmal anxiety] without agoraphobia: Secondary | ICD-10-CM

## 2020-10-10 ENCOUNTER — Ambulatory Visit: Payer: 59 | Admitting: Family Medicine

## 2020-10-15 ENCOUNTER — Ambulatory Visit: Payer: 59 | Admitting: Family Medicine

## 2020-10-19 DIAGNOSIS — Z9889 Other specified postprocedural states: Secondary | ICD-10-CM | POA: Insufficient documentation

## 2020-10-19 DIAGNOSIS — K921 Melena: Secondary | ICD-10-CM | POA: Insufficient documentation

## 2020-10-19 DIAGNOSIS — K635 Polyp of colon: Secondary | ICD-10-CM | POA: Insufficient documentation

## 2020-10-19 DIAGNOSIS — J302 Other seasonal allergic rhinitis: Secondary | ICD-10-CM | POA: Insufficient documentation

## 2020-10-19 DIAGNOSIS — D219 Benign neoplasm of connective and other soft tissue, unspecified: Secondary | ICD-10-CM | POA: Insufficient documentation

## 2020-10-19 DIAGNOSIS — Z8619 Personal history of other infectious and parasitic diseases: Secondary | ICD-10-CM | POA: Insufficient documentation

## 2020-10-19 DIAGNOSIS — F509 Eating disorder, unspecified: Secondary | ICD-10-CM | POA: Insufficient documentation

## 2020-10-19 DIAGNOSIS — N939 Abnormal uterine and vaginal bleeding, unspecified: Secondary | ICD-10-CM | POA: Insufficient documentation

## 2020-10-25 ENCOUNTER — Ambulatory Visit: Payer: 59 | Admitting: Family Medicine

## 2020-11-01 NOTE — Progress Notes (Signed)
Cardiology Office Note:    Date:  11/02/2020   ID:  [name], DOB 01/19/1961, MRN [id number]  PCP:  Orma Flaming, MD  Cardiologist:  Shirlee More, MD    Referring MD: Orma Flaming, MD    ASSESSMENT:    1. APC (atrial premature contractions)   2. Elevated blood pressure reading    PLAN:    In order of problems listed above:  1. She continues to do well minimally symptomatic and will not need suppressive treatment at this time 2. Blood pressure my office is greater than 160/90 on multiple determinations and her blood pressure recently has been stage II hypertension.  We will go ahead and start her after discussion with a low-dose of low intensity ARB check renal function 2 weeks bring a list of her blood pressure responses 3. Stable pattern right bundle branch block   Next appointment: 1 year   Medication Adjustments/Labs and Tests Ordered: Current medicines are reviewed at length with the patient today.  Concerns regarding medicines are outlined above.  Orders Placed This Encounter  Procedures  . Basic metabolic panel  . EKG 12-Lead   Meds ordered this encounter  Medications  . losartan (COZAAR) 50 MG tablet    Sig: Take 1 tablet (50 mg total) by mouth daily.    Dispense:  90 tablet    Refill:  3    Chief Complaint  Patient presents with  . Follow-up    For APCs    History of Present Illness:    Courtney Watson is a 59 y.o. female with a hx of symptomatic APCs last seen 11/03/2019.  Evaluation included echocardiogram in June 2019 left ventricle is normal in size wall thickness and function and diastolic filling pressure.  She had mild left atrial enlargement and a 48-hour monitor showed the presence of frequent APCs 13.3% burden with 10 brief runs of APCs the longest 30 beats in duration at rates of 110 to 155 bpm.   Compliance with diet, lifestyle and medications: yes She is quite concerned recent blood pressure determinations have been greater than 1  50-1 60 and greater than 90 repeatedly.  After discussion of benefits we will initiate antihypertensive therapy with a low-dose of a low intensity statin check blood pressure daily at home record and bring to the office in 2 weeks and we will recheck her renal function. She has very little palpitation not severe or sustained Fully immunized for Covid No chest pain edema palpitation.  Past Medical History:  Diagnosis Date  . Abnormal uterine bleeding    when she had fibroid tumor  . Anxiety   . Blood in stool   . 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   Overview:  Clinically Left C4 Clinically Left C4  . Cancer [facility]) 2005   colon   . Cervical radiculopathy 01/10/2015  . Colon polyps   . Eating disorder   . Fatigue 01/25/2018  . Fibroid   . H/O rhinoplasty   . History of chicken pox   . Irregular heart beat 04/01/2018  . Knee pain, right 08/22/2015  . Left thyroid nodule    needs follow up ultrasound in 2021  . 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 Malignant tumor of colon ([place]) 04/01/2018  . Neck pain 09/12/2014  . Osteopenia 2018   hips and spine  . Palpitations 04/01/2018  . Perimenopausal disorder 04/01/2018  . Seasonal allergies   . Shortness of breath on exertion 06/08/2017  . Ulcerative colitis ([place])   .  Uterine leiomyoma 04/01/2018  . Vitamin D deficiency     Past Surgical History:  Procedure Laterality Date  . COLON SURGERY  2005   colon cancer--Dr.Rosenbower  . ENDOMETRIAL ABLATION  2012   Dr.Lomax  . MYOMECTOMY  2012   Dr.Lomax    Current Medications: Current Meds  Medication Sig  . ALPRAZolam (XANAX) 0.5 MG tablet Take 1 tablet (0.5 mg total) by mouth 3 (three) times daily as needed.  . Calcium Carbonate (CALCIUM 500 PO) Take 1 tablet by mouth 2 (two) times daily.  . Cholecalciferol (VITAMIN D3) 125 MCG (5000 UT) CAPS Take 1 capsule by mouth daily.  . cyanocobalamin (,VITAMIN B-12,) 1000 MCG/ML injection Inject 41mL into muscle q 30  days  . cyclobenzaprine (FLEXERIL) 5 MG tablet Take 1 tablet (5 mg total) by mouth 3 (three) times daily as needed for muscle spasms.  . Multiple Vitamins-Minerals (HAIR/SKIN/NAILS/BIOTIN PO) Take by mouth.  Marland Kitchen OVER THE COUNTER MEDICATION Ibgard--Takes 1 tablet tid  . sertraline (ZOLOFT) 100 MG tablet TAKE ONE TABLET BY MOUTH DAILY  . SYRINGE-NEEDLE, DISP, 3 ML 23G X 1" 3 ML MISC 1 Device by Does not apply route every 30 (thirty) days.     Allergies:   Patient has no known allergies.   Social History   Socioeconomic History  . Marital status: Married    Spouse name: Not on file  . Number of children: Not on file  . Years of education: Not on file  . Highest education level: Not on file  Occupational History  . Not on file  Tobacco Use  . Smoking status: Former Smoker    Types: Cigarettes    Quit date: 11/18/2011    Years since quitting: 8.9  . Smokeless tobacco: Never Used  Vaping Use  . Vaping Use: Every day  . Start date: 11/18/2011  Substance and Sexual Activity  . Alcohol use: Yes    Alcohol/week: 4.0 standard drinks    Types: 4 Cans of beer per week    Comment: on the weekends  . Drug use: No  . Sexual activity: Yes    Partners: Male    Birth control/protection: Post-menopausal    Comment: Ablation 2012  Other Topics Concern  . Not on file  Social History Narrative  . Not on file   Social Determinants of Health   Financial Resource Strain: Not on file  Food Insecurity: Not on file  Transportation Needs: Not on file  Physical Activity: Not on file  Stress: Not on file  Social Connections: Not on file     Family History: The patient's family history includes Arthritis in her mother; Breast cancer in her paternal grandmother; Cancer in her father and paternal grandfather; Cancer (age of onset: 72) in her maternal uncle; Depression in her maternal grandmother; Diabetes in her maternal grandfather; Hearing loss in her mother; Heart disease in her maternal  grandfather; Hyperlipidemia in her father; Hypertension in her father, maternal grandfather, and mother; Osteoarthritis in her father; Osteoporosis in her mother; Parkinson's disease in her father; Rheum arthritis in her father. ROS:   Please see the history of present illness.    All other systems reviewed and are negative.  EKGs/Labs/Other Studies Reviewed:    The following studies were reviewed today:  EKG:  EKG ordered today and personally reviewed.  The ekg ordered today demonstrates sinus rhythm right bundle branch block  Recent Labs: 04/06/2020: ALT 18; BUN 11; Creatinine, Ser 0.57; Hemoglobin 14.0; Platelets 215.0; Potassium 4.2; Sodium 137  08/31/2020: TSH 2.06  Recent Lipid Panel    Component Value Date/Time   CHOL 262 (H) 04/06/2020 1007   CHOL 240 (H) 07/20/2019 1624   TRIG 85.0 04/06/2020 1007   HDL 111.50 04/06/2020 1007   HDL 123 07/20/2019 1624   CHOLHDL 2 04/06/2020 1007   VLDL 17.0 04/06/2020 1007   LDLCALC 133 (H) 04/06/2020 1007   LDLCALC 96 07/20/2019 1624    Physical Exam:    VS:  BP (!) 160/68   Pulse 77   Ht 5\' 7"  (1.702 m)   Wt 164 lb 1.9 oz (74.4 kg)   SpO2 98%   BMI 25.70 kg/m     Wt Readings from Last 3 Encounters:  11/02/20 164 lb 1.9 oz (74.4 kg)  09/10/20 166 lb 6.4 oz (75.5 kg)  09/05/20 165 lb (74.8 kg)     GEN: well nourished, well developed in no acute distress HEENT: Normal NECK: No JVD; No carotid bruits LYMPHATICS: No lymphadenopathy CARDIAC: RRR, no murmurs, rubs, gallops RESPIRATORY:  Clear to auscultation without rales, wheezing or rhonchi  ABDOMEN: Soft, non-tender, non-distended MUSCULOSKELETAL:  No edema; No deformity  SKIN: Warm and dry NEUROLOGIC:  Alert and oriented x 3 PSYCHIATRIC:  Normal affect    Signed, Shirlee More, MD  11/02/2020 4:29 PM    [place]

## 2020-11-02 ENCOUNTER — Other Ambulatory Visit: Payer: Self-pay

## 2020-11-02 ENCOUNTER — Encounter: Payer: Self-pay | Admitting: Cardiology

## 2020-11-02 ENCOUNTER — Ambulatory Visit (INDEPENDENT_AMBULATORY_CARE_PROVIDER_SITE_OTHER): Payer: 59 | Admitting: Cardiology

## 2020-11-02 VITALS — BP 160/68 | HR 77 | Ht 67.0 in | Wt 164.1 lb

## 2020-11-02 DIAGNOSIS — I491 Atrial premature depolarization: Secondary | ICD-10-CM | POA: Diagnosis not present

## 2020-11-02 DIAGNOSIS — R030 Elevated blood-pressure reading, without diagnosis of hypertension: Secondary | ICD-10-CM

## 2020-11-02 MED ORDER — LOSARTAN POTASSIUM 50 MG PO TABS: 50.0000 mg | ORAL_TABLET | Freq: Every day | ORAL | 3 refills | Status: DC

## 2020-11-02 NOTE — Patient Instructions (Signed)
Medication Instructions:  Your physician has recommended you make the following change in your medication:  START: Cozaar 50 mg take one tablet by mouth daily.  *If you need a refill on your cardiac medications before your next appointment, please call your pharmacy*   Lab Work: Your physician recommends that you return for lab work in: 2 [place] If you have labs (blood work) drawn today and your tests are completely normal, you will receive your results only by:  [place] (if you have MyChart) OR  A paper copy in the mail If you have any lab test that is abnormal or we need to change your treatment, we will call you to review the results.   Testing/Procedures: None   Follow-Up: At [facility], you and your health needs are our priority.  As part of our continuing mission to provide you with exceptional heart care, we have created designated Provider Care Teams.  These Care Teams include your primary Cardiologist (physician) and Advanced Practice Providers (APPs -  Physician Assistants and Nurse Practitioners) who all work together to provide you with the care you need, when you need it.  We recommend signing up for the patient portal called "MyChart".  Sign up information is provided on this After Visit Summary.  MyChart is used to connect with patients for Virtual Visits (Telemedicine).  Patients are able to view lab/test results, encounter notes, upcoming appointments, etc.  Non-urgent messages can be sent to your provider as well.   To learn more about what you can do with MyChart, go to [email or web address].    Your next appointment:   1 year(s)  The format for your next appointment:   In Person  Provider:   Shirlee More, MD   Other Instructions

## 2020-11-07 ENCOUNTER — Ambulatory Visit: Payer: 59 | Admitting: Family Medicine

## 2020-11-29 ENCOUNTER — Other Ambulatory Visit: Payer: Self-pay | Admitting: Cardiology

## 2020-11-30 LAB — BASIC METABOLIC PANEL
BUN/Creatinine Ratio: 20 (ref 9–23)
BUN: 12 mg/dL (ref 6–24)
CO2: 24 mmol/L (ref 20–29)
Calcium: 9.6 mg/dL (ref 8.7–10.2)
Chloride: 103 mmol/L (ref 96–106)
Creatinine, Ser: 0.61 mg/dL (ref 0.57–1.00)
GFR calc Af Amer: 116 mL/min/{1.73_m2} (ref >59)
GFR calc non Af Amer: 101 mL/min/{1.73_m2} (ref >59)
Glucose: 83 mg/dL (ref 65–99)
Potassium: 4.2 mmol/L (ref 3.5–5.2)
Sodium: 142 mmol/L (ref 134–144)

## 2021-05-03 ENCOUNTER — Encounter: Payer: Self-pay | Admitting: Family

## 2021-05-03 ENCOUNTER — Ambulatory Visit (INDEPENDENT_AMBULATORY_CARE_PROVIDER_SITE_OTHER): Payer: 59

## 2021-05-03 ENCOUNTER — Ambulatory Visit (INDEPENDENT_AMBULATORY_CARE_PROVIDER_SITE_OTHER): Payer: 59 | Admitting: Family

## 2021-05-03 ENCOUNTER — Other Ambulatory Visit: Payer: Self-pay

## 2021-05-03 VITALS — BP 131/85 | HR 83 | Temp 98.3°F | Ht 67.0 in | Wt 167.0 lb

## 2021-05-03 DIAGNOSIS — M5412 Radiculopathy, cervical region: Secondary | ICD-10-CM | POA: Diagnosis not present

## 2021-05-03 DIAGNOSIS — M542 Cervicalgia: Secondary | ICD-10-CM

## 2021-05-03 MED ORDER — PREDNISONE 20 MG PO TABS: 20.0000 mg | ORAL_TABLET | Freq: Every day | ORAL | 0 refills | Status: DC

## 2021-05-05 NOTE — Progress Notes (Signed)
Acute Office Visit  Subjective:    Patient ID: Courtney Watson, female    DOB: 03-23-1961, 60 y.o.   MRN: [id number]  Chief Complaint  Patient presents with   Neck Pain   Back Pain    HPI Patient is in today with c/o neck pain difficulty turning her neck. Pain 8/10, worse with movement especially to the right. She reports having tingling in both arms. No acute injury to her neck but reports sitting at a computer working daily. Has not tried any therapy.   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[place]) 04/01/2018   Neck pain 09/12/2014   Osteopenia 2018   hips and spine   Palpitations 04/01/2018   Perimenopausal disorder 04/01/2018   Seasonal allergies    Shortness of breath on exertion 06/08/2017   Ulcerative colitis ([place])    Uterine leiomyoma 04/01/2018   Vitamin D deficiency     Past Surgical History:  Procedure Laterality Date   COLON SURGERY  2005   colon cancer--Dr.Rosenbower   ENDOMETRIAL ABLATION  2012   Dr.Lomax   MYOMECTOMY  2012   Dr.Lomax    Family History  Problem Relation Age of Onset   Osteoporosis Mother    Hypertension Mother    Arthritis Mother    Hearing loss Mother    Cancer Father        prostate   Parkinson's disease Father        Dec age 62 from parkinsons/Lewey Body Dementia?   Hypertension Father    Hyperlipidemia Father    Osteoarthritis Father    Rheum arthritis Father     Cancer Maternal Uncle 18       colon cancer   Depression Maternal Grandmother    Diabetes Maternal Grandfather    Heart disease Maternal Grandfather    Hypertension Maternal Grandfather    Breast cancer Paternal Grandmother    Cancer Paternal Grandfather     Social History   Socioeconomic History   Marital status: Married    Spouse name: Not on file   Number of children: Not on file   Years of education: Not on file   Highest education level: Not on file  Occupational History   Not on file  Tobacco Use   Smoking status: Former    Pack years: 0.00    Types: Cigarettes    Quit date: 11/18/2011    Years since quitting: 9.4   Smokeless tobacco: Never  Vaping Use   Vaping Use: Every day   Start date: 11/18/2011  Substance and Sexual Activity   Alcohol use: Yes    Alcohol/week: 4.0 standard drinks    Types: 4 Cans of beer per week    Comment: on the weekends   Drug use: No   Sexual  activity: Yes    Partners: Male    Birth control/protection: Post-menopausal    Comment: Ablation 2012  Other Topics Concern   Not on file  Social History Narrative   Not on file   Social Determinants of Health   Financial Resource Strain: Not on file  Food Insecurity: Not on file  Transportation Needs: Not on file  Physical Activity: Not on file  Stress: Not on file  Social Connections: Not on file  Intimate Partner Violence: Not on file    Outpatient Medications Prior to Visit  Medication Sig Dispense Refill   ALPRAZolam (XANAX) 0.5 MG tablet Take 1 tablet (0.5 mg total) by mouth 3 (three) times daily as needed. 90 tablet 1   Calcium Carbonate (CALCIUM 500 PO) Take 1 tablet by mouth 2 (two) times daily.     Cholecalciferol (VITAMIN D3) 125 MCG (5000 UT) CAPS Take 1 capsule by mouth daily.     cyanocobalamin (,VITAMIN B-12,) 1000 MCG/ML injection Inject 8mL into muscle q 30 days 10 mL 0   cyclobenzaprine (FLEXERIL) 5 MG tablet Take 1 tablet (5 mg total) by mouth 3 (three) times daily  as needed for muscle spasms. 30 tablet 1   Multiple Vitamins-Minerals (HAIR/SKIN/NAILS/BIOTIN PO) Take by mouth.     OVER THE COUNTER MEDICATION Ibgard--Takes 1 tablet tid     sertraline (ZOLOFT) 100 MG tablet TAKE ONE TABLET BY MOUTH DAILY 90 tablet 3   SYRINGE-NEEDLE, DISP, 3 ML 23G X 1" 3 ML MISC 1 Device by Does not apply route every 30 (thirty) days. 50 each 1   losartan (COZAAR) 50 MG tablet Take 1 tablet (50 mg total) by mouth daily. 90 tablet 3   No facility-administered medications prior to visit.    No Known Allergies  Review of Systems  Constitutional: Negative.   Respiratory: Negative.    Cardiovascular: Negative.   Endocrine: Negative.   Genitourinary: Negative.   Musculoskeletal:  Positive for neck pain and neck stiffness.  Skin: Negative.   Allergic/Immunologic: Negative.   Neurological:  Negative for dizziness and weakness.       Tingling down both arms   Psychiatric/Behavioral: Negative.    All other systems reviewed and are negative.     Objective:    Physical Exam Vitals and nursing note reviewed.  Constitutional:      Appearance: Normal appearance.  Neck:     Comments: ROM performed with discomfort especially with rotation to the right and neck tilt to the right. Muscle tension noted to the cervical spine. Cardiovascular:     Rate and Rhythm: Normal rate and regular rhythm.     Pulses: Normal pulses.     Heart sounds: Normal heart sounds.  Pulmonary:     Effort: Pulmonary effort is normal.     Breath sounds: Normal breath sounds.  Musculoskeletal:     Cervical back: Tenderness present.  Lymphadenopathy:     Cervical: Cervical adenopathy present.  Skin:    General: Skin is warm and dry.  Neurological:     General: No focal deficit present.     Mental Status: She is alert and oriented to person, place, and time.  Psychiatric:        Mood and Affect: Mood normal.        Behavior: Behavior normal.    BP 131/85   Pulse 83   Temp 98.3 F (36.8  C) (Temporal)   Ht 5\' 7"  (1.702 m)   Wt 167 lb (75.8 kg)   SpO2  97%   BMI 26.16 kg/m  Wt Readings from Last 3 Encounters:  05/03/21 167 lb (75.8 kg)  11/02/20 164 lb 1.9 oz (74.4 kg)  09/10/20 166 lb 6.4 oz (75.5 kg)    Health Maintenance Due  Topic Date Due   COVID-19 Vaccine (4 - Booster for Pfizer series) 11/27/2020   PAP SMEAR-Modifier  03/19/2021    There are no preventive care reminders to display for this patient.   Lab Results  Component Value Date   TSH 2.06 08/31/2020   Lab Results  Component Value Date   WBC 6.6 04/06/2020   HGB 14.0 04/06/2020   HCT 41.4 04/06/2020   MCV 92.4 04/06/2020   PLT 215.0 04/06/2020   Lab Results  Component Value Date   NA 142 11/29/2020   K 4.2 11/29/2020   CO2 24 11/29/2020   GLUCOSE 83 11/29/2020   BUN 12 11/29/2020   CREATININE 0.61 11/29/2020   BILITOT 0.4 04/06/2020   ALKPHOS 101 04/06/2020   AST 22 04/06/2020   ALT 18 04/06/2020   PROT 7.3 04/06/2020   ALBUMIN 4.6 04/06/2020   CALCIUM 9.6 11/29/2020   GFR 109.36 04/06/2020   Lab Results  Component Value Date   CHOL 262 (H) 04/06/2020   Lab Results  Component Value Date   HDL 111.50 04/06/2020   Lab Results  Component Value Date   LDLCALC 133 (H) 04/06/2020   Lab Results  Component Value Date   TRIG 85.0 04/06/2020   Lab Results  Component Value Date   CHOLHDL 2 04/06/2020   No results found for: HGBA1C     Assessment & Plan:   Problem List Items Addressed This Visit     Neck pain   Cervical radiculopathy - Primary   Relevant Orders   DG Cervical Spine Complete (Completed)     Meds ordered this encounter  Medications   predniSONE (DELTASONE) 20 MG tablet    Sig: Take 1 tablet (20 mg total) by mouth daily with breakfast. 2 tabs daily x 4 days, then 1 tab daily x 4 days    Dispense:  12 tablet    Refill:  0   Call the office with any questions or concerns. Recheck pending xray and sooner as needed.   Kennyth Arnold, FNP

## 2021-07-07 IMAGING — DX DG CERVICAL SPINE COMPLETE 4+V
6 series · 6 of 6 positions shown · non-contrast
Comparison: None.

CLINICAL DATA: 59-year-old female with neck pain.

EXAM:
CERVICAL SPINE - COMPLETE 4+ VIEW

[cervical spine lat]
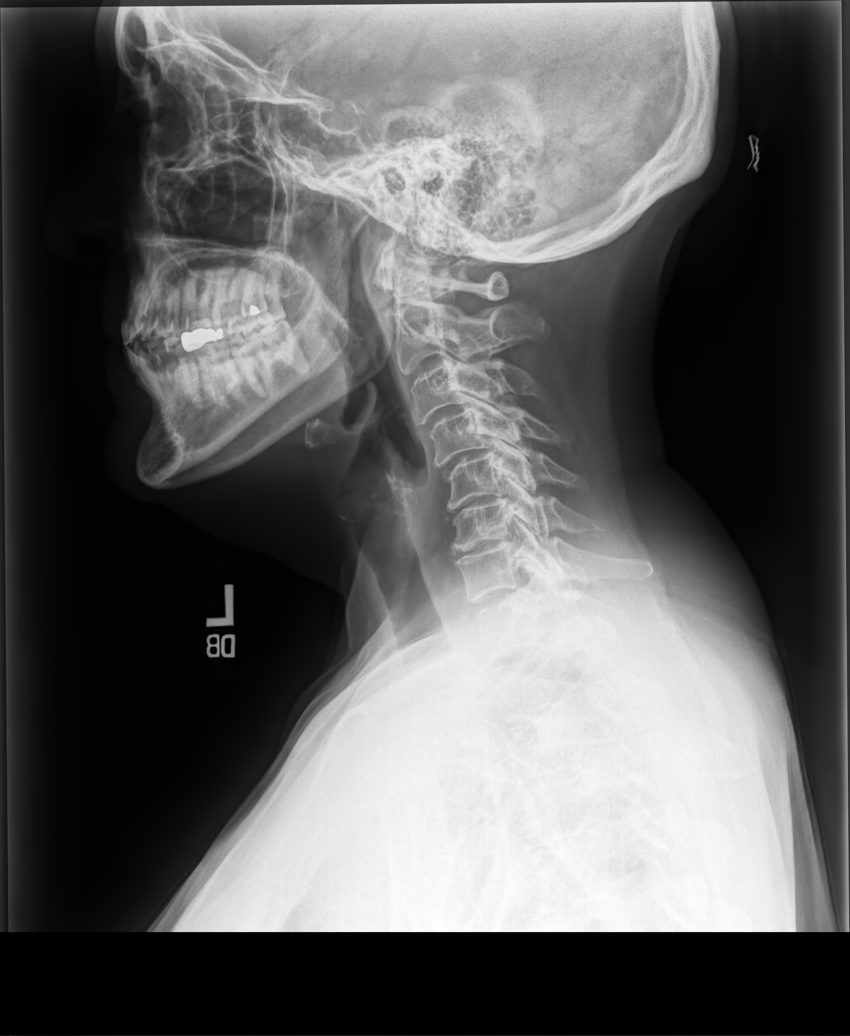

[cervical spine oblique (1 of 2)]
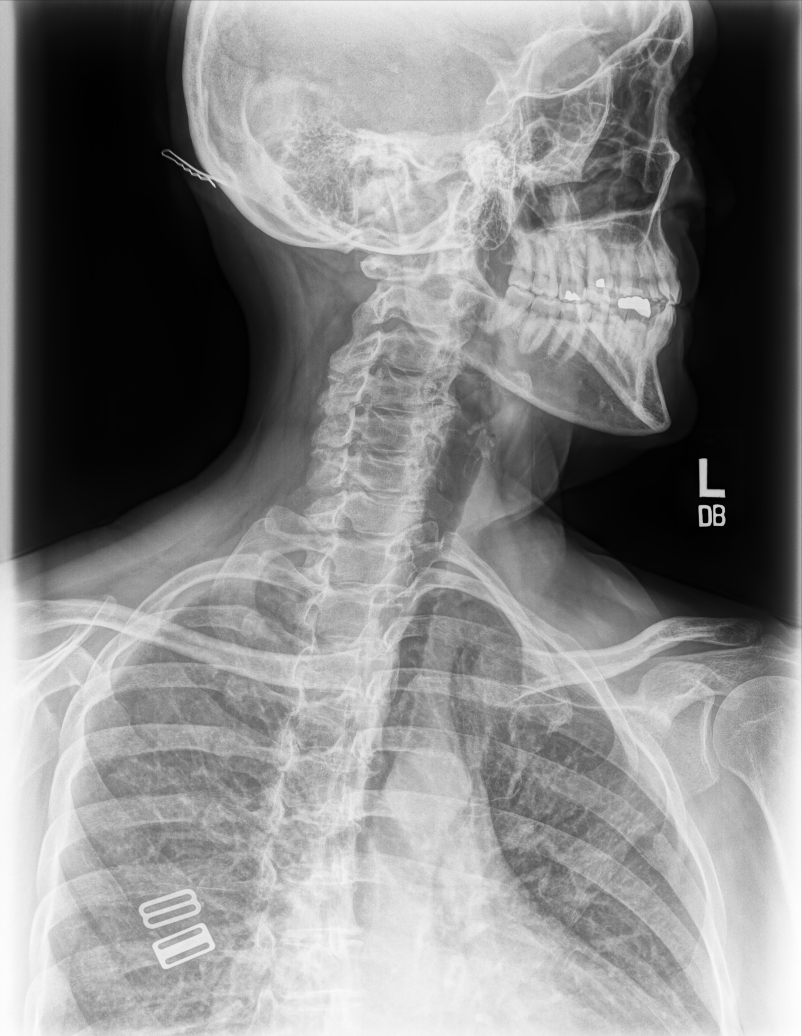

[cervical spine oblique (2 of 2)]
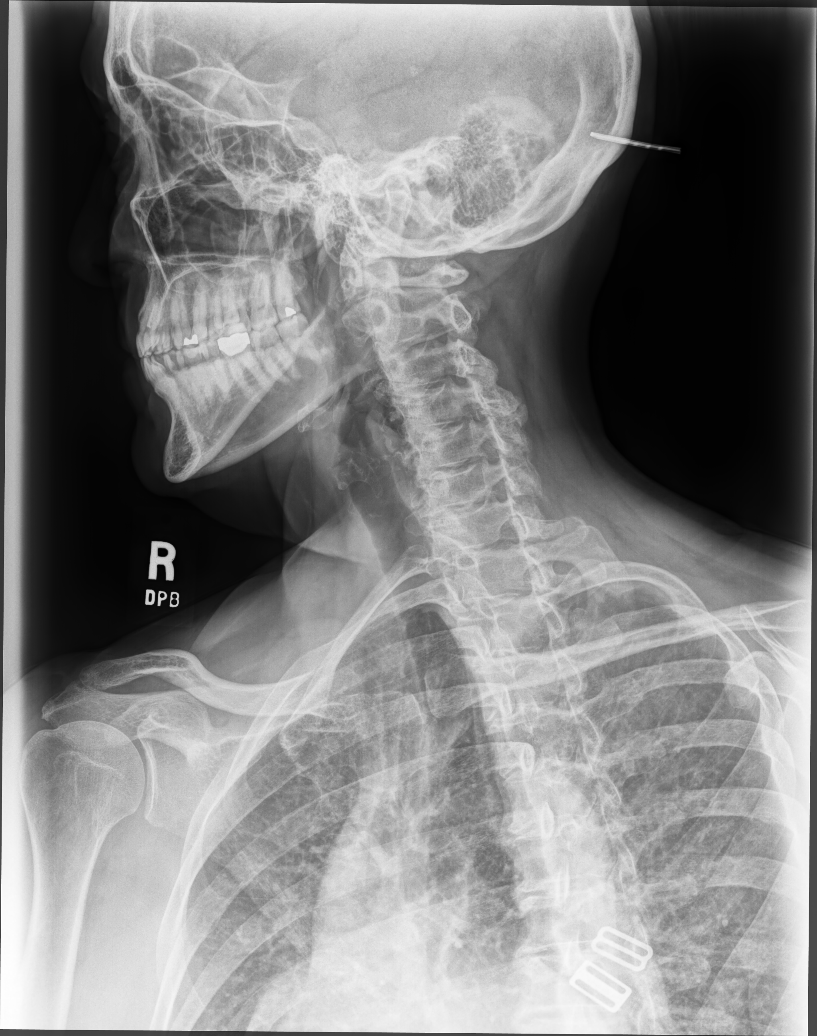

[cervical spine ap (1 of 3)]
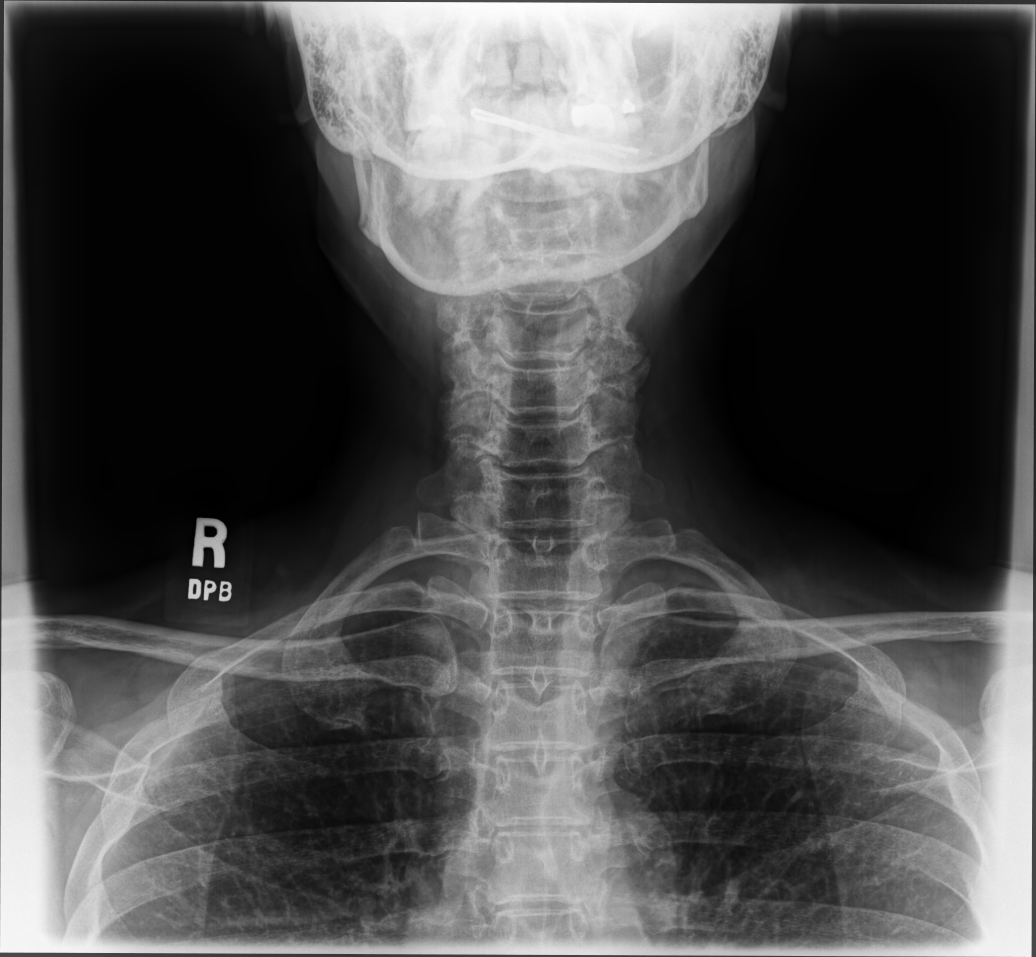

[cervical spine ap (2 of 3)]
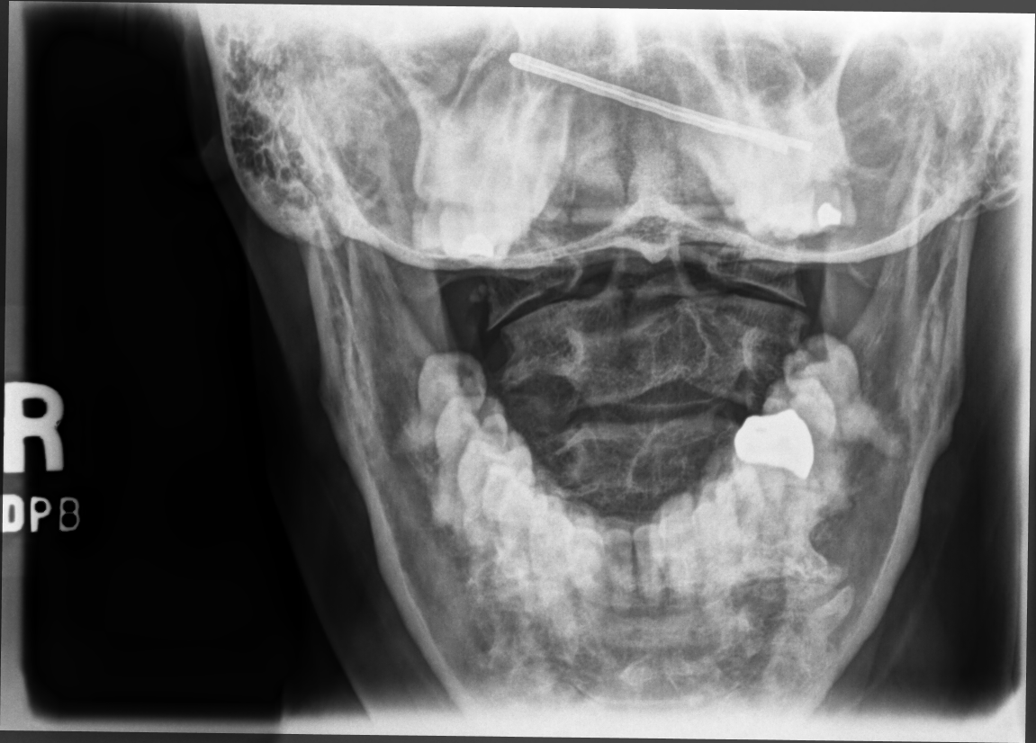

[cervical spine ap (3 of 3)]
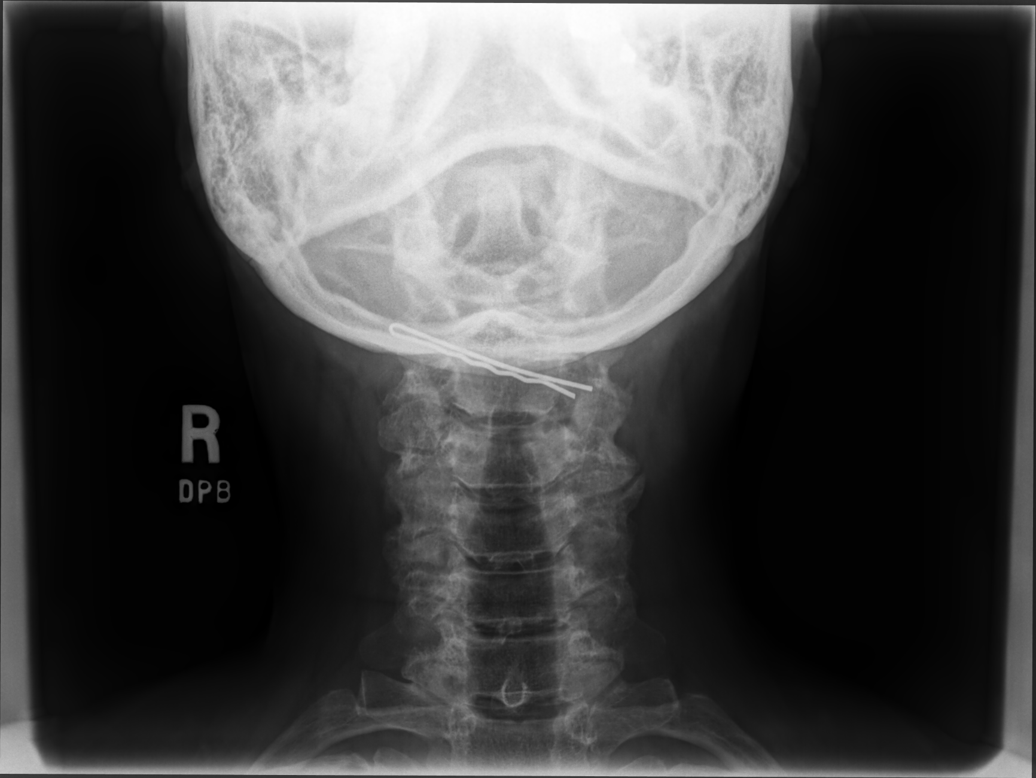

[6 of 6 positions shown; findings below may reference images not displayed]

FINDINGS: There is no acute fracture or subluxation of the cervical spine.
There is mild reversal of normal cervical lordosis which may be
positional or due to muscle spasm. There is osteopenia with
multilevel degenerative changes. The visualized posterior elements
and odontoid appear intact. There is approximately 2 mm lateral
orientation of the right lateral mass of C1 in relation to C2,
possibly chronic. Multilevel neural foramina narrowing. The soft
tissues are unremarkable.
IMPRESSION: 1. No acute fracture or subluxation of the cervical spine.
2. Osteopenia with multilevel degenerative changes.
3. Minimal lateral orientation of the right lateral mass of C1 in
relation to C2.

## 2021-07-10 ENCOUNTER — Other Ambulatory Visit: Payer: Self-pay | Admitting: Family Medicine

## 2021-07-11 LAB — HM MAMMOGRAPHY

## 2021-07-15 ENCOUNTER — Encounter: Payer: Self-pay | Admitting: Physician Assistant

## 2021-07-15 ENCOUNTER — Other Ambulatory Visit: Payer: Self-pay

## 2021-07-15 ENCOUNTER — Ambulatory Visit: Payer: 59 | Admitting: Physician Assistant

## 2021-07-15 VITALS — BP 156/89 | HR 87 | Temp 98.2°F | Ht 67.0 in | Wt 169.8 lb

## 2021-07-15 DIAGNOSIS — E538 Deficiency of other specified B group vitamins: Secondary | ICD-10-CM

## 2021-07-15 DIAGNOSIS — E559 Vitamin D deficiency, unspecified: Secondary | ICD-10-CM

## 2021-07-15 DIAGNOSIS — I10 Essential (primary) hypertension: Secondary | ICD-10-CM

## 2021-07-15 LAB — VITAMIN D 25 HYDROXY (VIT D DEFICIENCY, FRACTURES): VITD: 63.69 ng/mL (ref 30.00–100.00)

## 2021-07-15 LAB — VITAMIN B12: Vitamin B-12: 490 pg/mL (ref 211–911)

## 2021-07-15 MED ORDER — LOSARTAN POTASSIUM-HCTZ 50-12.5 MG PO TABS: 1.0000 | ORAL_TABLET | Freq: Every day | ORAL | 0 refills | Status: DC

## 2021-07-15 MED ORDER — CYANOCOBALAMIN 1000 MCG/ML IJ SOLN: INTRAMUSCULAR | 0 refills | Status: DC

## 2021-07-15 NOTE — Patient Instructions (Addendum)
Good to meet you today! Please go to the lab for blood work and I will send results through [facility].  Try the losartan-HCTZ combination medication for your blood pressure. Monitor at home. Goal 120/80. Keep working on exercise and dietary habits. Good job with no salt. Drink plenty of water..  See you back for a physical!

## 2021-07-15 NOTE — Progress Notes (Signed)
Acute Office Visit  Subjective:    Patient ID: Courtney Watson, female    DOB: 1961/02/03, 60 y.o.   MRN: [id number]  Chief Complaint  Patient presents with   Medication Refill    b12    Medication Refill  Patient is in today for B12 check. It has been about 5 weeks since her last injection. Her husband gives her the injections at home. She notices improved energy level with the B12, but still has some nail splitting. She would also like vit D level rechecked. She is taking 1000 iu to 2000 iu daily.   Pt states her blood pressure has been fluctuating as well. Started on Losartan 50 mg last year with cardiologist. F/up in December with them. Mother has hx HTN.  Denies any other issues or concerns today.   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   Malignant tumor of colon ([place]) 04/01/2018   Neck pain 09/12/2014   Osteopenia 2018   hips and spine   Palpitations 04/01/2018   Perimenopausal disorder 04/01/2018   Seasonal allergies    Shortness of breath on exertion 06/08/2017   Ulcerative colitis ([place])    Uterine leiomyoma 04/01/2018   Vitamin D deficiency     Past Surgical History:  Procedure Laterality Date   COLON SURGERY  2005   colon cancer--Dr.Rosenbower   ENDOMETRIAL ABLATION  2012   Dr.Lomax   MYOMECTOMY  2012   Dr.Lomax    Family History  Problem Relation Age of Onset   Osteoporosis Mother    Hypertension Mother     Arthritis Mother    Hearing loss Mother    Cancer Father        prostate   Parkinson's disease Father        Dec age 15 from parkinsons/Lewey Body Dementia?   Hypertension Father    Hyperlipidemia Father    Osteoarthritis Father    Rheum arthritis Father    Cancer Maternal Uncle 72       colon cancer   Depression Maternal Grandmother    Diabetes Maternal Grandfather    Heart disease Maternal Grandfather    Hypertension Maternal Grandfather    Breast cancer Paternal Grandmother    Cancer Paternal Grandfather     Social History   Socioeconomic History   Marital status: Married    Spouse name: Not on file   Number of children: Not on file   Years of education: Not on file   Highest education level: Not on file  Occupational History   Not on file  Tobacco Use   Smoking status: Former    Types: Cigarettes    Quit date: 11/18/2011    Years since quitting: 9.6   Smokeless tobacco: Never  Vaping Use   Vaping Use: Every day   Start date:  11/18/2011  Substance and Sexual Activity   Alcohol use: Yes    Alcohol/week: 4.0 standard drinks    Types: 4 Cans of beer per week    Comment: on the weekends   Drug use: No   Sexual activity: Yes    Partners: Male    Birth control/protection: Post-menopausal    Comment: Ablation 2012  Other Topics Concern   Not on file  Social History Narrative   Not on file   Social Determinants of Health   Financial Resource Strain: Not on file  Food Insecurity: Not on file  Transportation Needs: Not on file  Physical Activity: Not on file  Stress: Not on file  Social Connections: Not on file  Intimate Partner Violence: Not on file    Outpatient Medications Prior to Visit  Medication Sig Dispense Refill   ALPRAZolam (XANAX) 0.5 MG tablet Take 1 tablet (0.5 mg total) by mouth 3 (three) times daily as needed. 90 tablet 1   Calcium Carbonate (CALCIUM 500 PO) Take 1 tablet by mouth 2 (two) times daily.     Cholecalciferol (VITAMIN D3) 125 MCG  (5000 UT) CAPS Take 1 capsule by mouth daily.     losartan (COZAAR) 50 MG tablet Take 1 tablet (50 mg total) by mouth daily. 90 tablet 3   Multiple Vitamins-Minerals (HAIR/SKIN/NAILS/BIOTIN PO) Take by mouth.     sertraline (ZOLOFT) 100 MG tablet TAKE ONE TABLET BY MOUTH DAILY 90 tablet 3   SYRINGE-NEEDLE, DISP, 3 ML 23G X 1" 3 ML MISC 1 Device by Does not apply route every 30 (thirty) days. 50 each 1   cyclobenzaprine (FLEXERIL) 5 MG tablet Take 1 tablet (5 mg total) by mouth 3 (three) times daily as needed for muscle spasms. (Patient not taking: Reported on 07/15/2021) 30 tablet 1   OVER THE COUNTER MEDICATION Ibgard--Takes 1 tablet tid (Patient not taking: Reported on 07/15/2021)     predniSONE (DELTASONE) 20 MG tablet Take 1 tablet (20 mg total) by mouth daily with breakfast. 2 tabs daily x 4 days, then 1 tab daily x 4 days (Patient not taking: Reported on 07/15/2021) 12 tablet 0   cyanocobalamin (,VITAMIN B-12,) 1000 MCG/ML injection Inject 55m into muscle q 30 days (Patient not taking: Reported on 07/15/2021) 10 mL 0   No facility-administered medications prior to visit.    No Known Allergies  Review of Systems REFER TO HPI FOR PERTINENT POSITIVES AND NEGATIVES     Objective:    Physical Exam Vitals and nursing note reviewed.  Constitutional:      Appearance: Normal appearance. She is normal weight. She is not toxic-appearing.  HENT:     Head: Normocephalic and atraumatic.     Right Ear: External ear normal.     Left Ear: External ear normal.     Nose: Nose normal.     Mouth/Throat:     Mouth: Mucous membranes are moist.  Eyes:     Extraocular Movements: Extraocular movements intact.     Conjunctiva/sclera: Conjunctivae normal.     Pupils: Pupils are equal, round, and reactive to light.  Cardiovascular:     Rate and Rhythm: Normal rate and regular rhythm.     Pulses: Normal pulses.     Heart sounds: Normal heart sounds.  Pulmonary:     Effort: Pulmonary effort is normal.      Breath sounds: Normal breath sounds.  Musculoskeletal:        General: Normal range of motion.     Cervical  back: Normal range of motion and neck supple.  Skin:    General: Skin is warm and dry.  Neurological:     General: No focal deficit present.     Mental Status: She is alert and oriented to person, place, and time.  Psychiatric:        Mood and Affect: Mood normal.        Behavior: Behavior normal.        Thought Content: Thought content normal.        Judgment: Judgment normal.    BP (!) 156/89   Pulse 87   Temp 98.2 F (36.8 C) (Temporal)   Ht '5\' 7"'$  (1.702 m)   Wt 169 lb 12.8 oz (77 kg)   SpO2 97%   BMI 26.59 kg/m  Wt Readings from Last 3 Encounters:  07/15/21 169 lb 12.8 oz (77 kg)  05/03/21 167 lb (75.8 kg)  11/02/20 164 lb 1.9 oz (74.4 kg)    Health Maintenance Due  Topic Date Due   PAP SMEAR-Modifier  03/19/2021   INFLUENZA VACCINE  06/17/2021    There are no preventive care reminders to display for this patient.   Lab Results  Component Value Date   TSH 2.06 08/31/2020   Lab Results  Component Value Date   WBC 6.6 04/06/2020   HGB 14.0 04/06/2020   HCT 41.4 04/06/2020   MCV 92.4 04/06/2020   PLT 215.0 04/06/2020   Lab Results  Component Value Date   NA 142 11/29/2020   K 4.2 11/29/2020   CO2 24 11/29/2020   GLUCOSE 83 11/29/2020   BUN 12 11/29/2020   CREATININE 0.61 11/29/2020   BILITOT 0.4 04/06/2020   ALKPHOS 101 04/06/2020   AST 22 04/06/2020   ALT 18 04/06/2020   PROT 7.3 04/06/2020   ALBUMIN 4.6 04/06/2020   CALCIUM 9.6 11/29/2020   GFR 109.36 04/06/2020   Lab Results  Component Value Date   CHOL 262 (H) 04/06/2020   Lab Results  Component Value Date   HDL 111.50 04/06/2020   Lab Results  Component Value Date   LDLCALC 133 (H) 04/06/2020   Lab Results  Component Value Date   TRIG 85.0 04/06/2020   Lab Results  Component Value Date   CHOLHDL 2 04/06/2020   No results found for: HGBA1C     Assessment & Plan:    Problem List Items Addressed This Visit       Other   Vitamin D deficiency   Relevant Orders   VITAMIN D 25 Hydroxy (Vit-D Deficiency, Fractures)   B12 deficiency - Primary   Relevant Orders   Vitamin B12   Other Visit Diagnoses     Essential hypertension       Relevant Medications   losartan-hydrochlorothiazide (HYZAAR) 50-12.5 MG tablet        Meds ordered this encounter  Medications   losartan-hydrochlorothiazide (HYZAAR) 50-12.5 MG tablet    Sig: Take 1 tablet by mouth daily.    Dispense:  30 tablet    Refill:  0   cyanocobalamin (,VITAMIN B-12,) 1000 MCG/ML injection    Sig: Inject 62m into muscle q 30 days    Dispense:  10 mL    Refill:  0   1. B12 deficiency 2. Vitamin D deficiency Recheck levels today and treat accordingly.  3. Essential hypertension Elevated in office. Will add HCTZ 12.5 mg to current Losartan 50 mg daily. Exercise and plenty of water encouraged. Low salt diet. MyChart with updates from  checks at home.    Jaeshaun Riva M Hisham Provence, PA-C

## 2021-07-17 ENCOUNTER — Encounter: Payer: Self-pay | Admitting: Physician Assistant

## 2021-08-08 ENCOUNTER — Ambulatory Visit (INDEPENDENT_AMBULATORY_CARE_PROVIDER_SITE_OTHER): Payer: 59 | Admitting: Psychiatry

## 2021-08-08 ENCOUNTER — Encounter: Payer: Self-pay | Admitting: Psychiatry

## 2021-08-08 ENCOUNTER — Other Ambulatory Visit: Payer: Self-pay

## 2021-08-08 DIAGNOSIS — F410 Panic disorder [episodic paroxysmal anxiety] without agoraphobia: Secondary | ICD-10-CM | POA: Diagnosis not present

## 2021-08-08 DIAGNOSIS — F40233 Fear of injury: Secondary | ICD-10-CM | POA: Diagnosis not present

## 2021-08-08 MED ORDER — ALPRAZOLAM 0.5 MG PO TABS: 0.5000 mg | ORAL_TABLET | Freq: Three times a day (TID) | ORAL | 1 refills | Status: DC | PRN

## 2021-08-08 MED ORDER — SERTRALINE HCL 100 MG PO TABS: 100.0000 mg | ORAL_TABLET | Freq: Every day | ORAL | 3 refills | Status: DC

## 2021-08-08 NOTE — Progress Notes (Signed)
Naveen Lorusso [id number] 1961/10/15 60 y.o.  Subjective:   Patient ID:  Courtney Watson is a 60 y.o. (DOB [birth year]) female.  Chief Complaint:  Chief Complaint  Patient presents with   Panic disorder without agoraphobia   Follow-up    HPI Courtney Watson presents to the office today for follow-up of anxiety.  08/08/20 appt without med changes: Cont sertraline 100 and about 10 Xanax/month.  08/08/21 appt noted: Pretty good.  Around mid May at [facility] had panic attack.  Taking Xanax again when going out after that.  Disappointed at having to take it again.  Anxiety is a little worse in the car again.  Good when she's driving.  Fiddles with fingers when H drives.   No SE.  Xanax works if needed. No spontaneous panic attacks lately.  Can trigger herself to feel throat close up if talks.  Review of Systems:  Review of Systems  Cardiovascular:  Negative for chest pain and palpitations.  Neurological:  Negative for tremors and weakness.   Medications: I have reviewed the patient's current medications.  Current Outpatient Medications  Medication Sig Dispense Refill   Biotin 10 MG CAPS Take by mouth.     Calcium Carbonate (CALCIUM 500 PO) Take 1 tablet by mouth 2 (two) times daily.     Cholecalciferol (VITAMIN D3) 125 MCG (5000 UT) CAPS Take 1 capsule by mouth daily.     cyanocobalamin (,VITAMIN B-12,) 1000 MCG/ML injection Inject 44mL into muscle q 30 days 10 mL 0   losartan-hydrochlorothiazide (HYZAAR) 50-12.5 MG tablet Take 1 tablet by mouth daily. 30 tablet 0   Multiple Vitamins-Minerals (HAIR/SKIN/NAILS/BIOTIN PO) Take by mouth.     SYRINGE-NEEDLE, DISP, 3 ML 23G X 1" 3 ML MISC 1 Device by Does not apply route every 30 (thirty) days. 50 each 1   ALPRAZolam (XANAX) 0.5 MG tablet Take 1 tablet (0.5 mg total) by mouth 3 (three) times daily as needed. 90 tablet 1   losartan (COZAAR) 50 MG tablet Take 1 tablet (50 mg total) by mouth daily. 90 tablet 3   sertraline (ZOLOFT) 100 MG tablet  Take 1 tablet (100 mg total) by mouth daily. 90 tablet 3   No current facility-administered medications for this visit.    Medication Side Effects: None  Allergies: No Known Allergies  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   Overview:  Clinically Left C4 Clinically Left C4   Cancer ([place]) 2005   colon    Cervical radiculopathy 01/10/2015   Colon polyps    Eating disorder    Fatigue 01/25/2018   Fibroid    H/O rhinoplasty    History of chicken pox    Irregular heart beat 04/01/2018   Knee pain, right 08/22/2015   Left thyroid nodule    needs follow up ultrasound in 2021   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   Hypertension Father    Hyperlipidemia Father    Osteoarthritis Father    Rheum arthritis Father    Cancer Maternal Uncle 66       colon cancer   Depression Maternal Grandmother    Diabetes Maternal Grandfather    Heart disease Maternal Grandfather    Hypertension Maternal Grandfather    Breast cancer Paternal Grandmother    Cancer Paternal Grandfather     Social History   Socioeconomic History   Marital status: Married    Spouse name: Not on file   Number of children: Not on file   Years of education: Not on file   Highest education level:  Not on file  Occupational History   Not on file  Tobacco Use   Smoking status: Former    Types: Cigarettes    Quit date: 11/18/2011    Years since quitting: 9.7   Smokeless tobacco: Never  Vaping Use   Vaping Use: Every day   Start date: 11/18/2011  Substance and Sexual Activity   Alcohol use: Yes    Alcohol/week: 4.0 standard drinks    Types: 4 Cans of beer per week    Comment: on the weekends   Drug use: No   Sexual activity: Yes    Partners: Male    Birth control/protection: Post-menopausal    Comment: Ablation 2012  Other Topics Concern   Not on file  Social History Narrative   Not on file   Social Determinants of Health   Financial Resource Strain: Not on file  Food Insecurity: Not on file  Transportation Needs: Not on file  Physical Activity: Not on file  Stress: Not on file  Social Connections: Not on file  Intimate Partner Violence: Not on file    Past Medical History, Surgical history, Social history, and Family history were reviewed and updated as appropriate.   Please see review of systems for further details on the patient's review from today.   Objective:   Physical Exam:  There were no vitals taken for this visit.  Physical Exam Constitutional:      General: She is not in acute distress.    Appearance: She is well-developed.  Neurological:     Mental Status: She is alert and oriented to person, place, and time.     Cranial Nerves: No dysarthria.  Psychiatric:        Attention and Perception: She is attentive. She does not perceive auditory or visual hallucinations.        Mood and Affect: Mood is anxious. Mood is not depressed. Affect is not labile, blunt, angry or inappropriate.        Speech: Speech normal.        Behavior: Behavior normal.        Thought Content: Thought content normal. Thought content does not include homicidal or suicidal ideation. Thought content does not include homicidal or suicidal plan.        Cognition and Memory:  Cognition normal.        Judgment: Judgment normal.     Comments: Insight intact. No auditory or visual hallucinations. No delusions.  She still has some anxiety around eating and requires the Xanax but it is manageable and much less  severe than in the past.  Some anxiety in car also    Lab Review:     Component Value Date/Time   NA 142 11/29/2020 1124   K 4.2 11/29/2020 1124   CL 103 11/29/2020 1124   CO2 24 11/29/2020 1124   GLUCOSE 83 11/29/2020 1124   GLUCOSE 98 04/06/2020 1007   BUN 12 11/29/2020 1124   CREATININE 0.61 11/29/2020 1124   CREATININE 0.59 03/18/2017 1506   CALCIUM 9.6 11/29/2020 1124   PROT 7.3 04/06/2020 1007   PROT 7.1 07/20/2019 1624   ALBUMIN 4.6 04/06/2020 1007   ALBUMIN 4.7 07/20/2019 1624   AST 22 04/06/2020 1007   ALT 18 04/06/2020 1007   ALKPHOS 101 04/06/2020 1007   BILITOT 0.4 04/06/2020 1007   BILITOT 0.2 07/20/2019 1624   GFRNONAA 101 11/29/2020 1124   GFRAA 116 11/29/2020 1124       Component Value Date/Time   WBC 6.6 04/06/2020 1007   RBC 4.48 04/06/2020 1007   HGB 14.0 04/06/2020 1007   HGB 12.5 07/20/2019 1624   HCT 41.4 04/06/2020 1007   HCT 38.8 07/20/2019 1624   PLT 215.0 04/06/2020 1007   PLT 250 07/20/2019 1624   MCV 92.4 04/06/2020 1007   MCV 93 07/20/2019 1624   MCH 30.0 07/20/2019 1624   MCH 31.2 03/18/2017 1506   MCHC 33.7 04/06/2020 1007   RDW 13.6 04/06/2020 1007   RDW 12.4 07/20/2019 1624   LYMPHSABS 1.6 04/06/2020 1007   MONOABS 0.8 04/06/2020 1007   EOSABS 0.3 04/06/2020 1007   BASOSABS 0.0 04/06/2020 1007    No results found for: POCLITH, LITHIUM   No results found for: PHENYTOIN, PHENOBARB, VALPROATE, CBMZ   .res Assessment: Plan:    Panic disorder without agoraphobia - Plan: sertraline (ZOLOFT) 100 MG tablet, ALPRAZolam (XANAX) 0.5 MG tablet  Fear of choking - Plan: ALPRAZolam (XANAX) 0.5 MG tablet   Courtney Watson has a history of panic disorder and a fear of choking.  It has been extreme and caused a  great deal of avoidance in the past she would avoid eating out.  Now she can eat out although she does have to take half of 0.5 mg Xanax in order to do so.  She gets overall antipanic effects from the sertraline. Continue sertraline 100 and prn Xnax.  High relapse risk without meds.  No med changes today  overall doing well.    Follow-up 1 year  We discussed the short-term risks associated with benzodiazepines including sedation and increased fall risk among others.  Discussed long-term side effect risk including dependence, potential withdrawal symptoms.  Courtney Parents, MD, DFAPA   Please see After Visit Summary for patient specific instructions.  Future Appointments  Date Time Provider [place]  08/20/2021  9:00 AM Allwardt, Randa Evens, PA-C [facility]  09/06/2021  3:20 PM Shamleffer, Melanie Crazier, MD [facility] None  10/16/2021 10:30 AM Nunzio Cobbs, MD [facility] None    No orders of the defined types were placed in this encounter.     -------------------------------

## 2021-08-10 ENCOUNTER — Other Ambulatory Visit: Payer: Self-pay | Admitting: Physician Assistant

## 2021-08-15 ENCOUNTER — Encounter: Payer: 59 | Admitting: Physician Assistant

## 2021-08-20 ENCOUNTER — Ambulatory Visit (INDEPENDENT_AMBULATORY_CARE_PROVIDER_SITE_OTHER): Payer: 59 | Admitting: Physician Assistant

## 2021-08-20 ENCOUNTER — Other Ambulatory Visit: Payer: Self-pay

## 2021-08-20 VITALS — BP 134/76 | HR 68 | Temp 97.6°F | Ht 67.0 in | Wt 169.2 lb

## 2021-08-20 DIAGNOSIS — Z131 Encounter for screening for diabetes mellitus: Secondary | ICD-10-CM

## 2021-08-20 DIAGNOSIS — Z13220 Encounter for screening for lipoid disorders: Secondary | ICD-10-CM | POA: Diagnosis not present

## 2021-08-20 DIAGNOSIS — Z0000 Encounter for general adult medical examination without abnormal findings: Secondary | ICD-10-CM

## 2021-08-20 DIAGNOSIS — K429 Umbilical hernia without obstruction or gangrene: Secondary | ICD-10-CM

## 2021-08-20 DIAGNOSIS — I10 Essential (primary) hypertension: Secondary | ICD-10-CM | POA: Diagnosis not present

## 2021-08-20 LAB — CBC WITH DIFFERENTIAL/PLATELET
Basophils Absolute: 0 10*3/uL (ref 0.0–0.1)
Basophils Relative: 0.4 % (ref 0.0–3.0)
Eosinophils Absolute: 0.3 10*3/uL (ref 0.0–0.7)
Eosinophils Relative: 4.2 % (ref 0.0–5.0)
HCT: 39.3 % (ref 36.0–46.0)
Hemoglobin: 13.1 g/dL (ref 12.0–15.0)
Lymphocytes Relative: 21.9 % (ref 12.0–46.0)
Lymphs Abs: 1.4 10*3/uL (ref 0.7–4.0)
MCHC: 33.2 g/dL (ref 30.0–36.0)
MCV: 93.4 fl (ref 78.0–100.0)
Monocytes Absolute: 0.7 10*3/uL (ref 0.1–1.0)
Monocytes Relative: 11.4 % (ref 3.0–12.0)
Neutro Abs: 4 10*3/uL (ref 1.4–7.7)
Neutrophils Relative %: 62.1 % (ref 43.0–77.0)
Platelets: 228 10*3/uL (ref 150.0–400.0)
RBC: 4.2 Mil/uL (ref 3.87–5.11)
RDW: 13.9 % (ref 11.5–15.5)
WBC: 6.4 10*3/uL (ref 4.0–10.5)

## 2021-08-20 LAB — LIPID PANEL
Cholesterol: 225 mg/dL — ABNORMAL HIGH (ref 0–200)
HDL: 94.7 mg/dL (ref >39.00)
LDL Cholesterol: 111 mg/dL — ABNORMAL HIGH (ref 0–99)
NonHDL: 130.5
Total CHOL/HDL Ratio: 2
Triglycerides: 97 mg/dL (ref 0.0–149.0)
VLDL: 19.4 mg/dL (ref 0.0–40.0)

## 2021-08-20 LAB — COMPREHENSIVE METABOLIC PANEL
ALT: 20 U/L (ref 0–35)
AST: 24 U/L (ref 0–37)
Albumin: 4.4 g/dL (ref 3.5–5.2)
Alkaline Phosphatase: 95 U/L (ref 39–117)
BUN: 14 mg/dL (ref 6–23)
CO2: 28 mEq/L (ref 19–32)
Calcium: 9.7 mg/dL (ref 8.4–10.5)
Chloride: 99 mEq/L (ref 96–112)
Creatinine, Ser: 0.56 mg/dL (ref 0.40–1.20)
GFR: 100.7 mL/min (ref >60.00)
Glucose, Bld: 90 mg/dL (ref 70–99)
Potassium: 4 mEq/L (ref 3.5–5.1)
Sodium: 137 mEq/L (ref 135–145)
Total Bilirubin: 0.5 mg/dL (ref 0.2–1.2)
Total Protein: 7.2 g/dL (ref 6.0–8.3)

## 2021-08-20 NOTE — Patient Instructions (Addendum)
*  Flu shot and COVID-19 booster at pharmacy *Check on Shingrix vaccine coverage   Referral to gen surgery for umbilical hernia  Labs today, will MyChart with results  Keep up the great work! BP looks awesome!   See you in 6 months.

## 2021-08-20 NOTE — Progress Notes (Signed)
Subjective:    Patient ID: Courtney Watson, female    DOB: 27-Mar-1961, 60 y.o.   MRN: [id number]  Chief Complaint  Patient presents with   Annual Exam    HPI Patient is in today for annual exam.  Acute concerns: Umbilical hernia is no longer able to be reduced and feels larger.   Health maintenance: Lifestyle/ exercise: Walked 30 miles last month, continuing this month to work on it  Nutrition: Very good, cooks at home Mental health: Doing well with Zoloft & Xanax Sleep: Doing well Substance use: Former smoker Sexual activity: Monogamous  Immunizations: Scheduled for flu and COVID-19 booster  Colonoscopy: UTD Pap: Scheduled in Nov with Dr. Quincy Simmonds Mammogram: UTD  DEXA: Last done in 2020, low bone density, f/up with Dr. 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   Malignant tumor of colon ([place]) 04/01/2018   Neck pain 09/12/2014   Osteopenia 2018   hips and spine   Palpitations 04/01/2018   Perimenopausal disorder 04/01/2018   Seasonal allergies    Shortness of breath on exertion 06/08/2017   Ulcerative colitis ([place])    Uterine leiomyoma 04/01/2018   Vitamin D deficiency     Past Surgical History:  Procedure Laterality Date   COLON SURGERY  2005   colon cancer--Dr.Rosenbower   ENDOMETRIAL ABLATION  2012   Dr.Lomax   MYOMECTOMY  2012   Dr.Lomax    Family History  Problem Relation Age of Onset   Osteoporosis  Mother    Hypertension Mother    Arthritis Mother    Hearing loss Mother    Cancer Father        prostate   Parkinson's disease Father        Dec age 71 from parkinsons/Lewey Body Dementia?   Hypertension Father    Hyperlipidemia Father    Osteoarthritis Father    Rheum arthritis Father    Cancer Maternal Uncle 22       colon cancer   Depression Maternal Grandmother    Diabetes Maternal Grandfather    Heart disease Maternal Grandfather    Hypertension Maternal Grandfather    Breast cancer Paternal Grandmother    Cancer Paternal Grandfather     Social History   Tobacco Use   Smoking status: Former    Types: Cigarettes    Quit date: 11/18/2011    Years since quitting: 9.7   Smokeless tobacco: Never  Vaping Use   Vaping Use: Every day   Start date: 11/18/2011  Substance Use Topics   Alcohol use: Yes    Alcohol/week: 4.0 standard drinks    Types: 4 Cans of beer per week    Comment: on the weekends   Drug use: No     No Known Allergies  Review of Systems  Constitutional:  Negative for activity change, appetite change, fever and unexpected weight change.  HENT:  Negative for congestion.   Eyes:  Negative for visual disturbance.  Respiratory:  Negative for apnea, cough and shortness of breath.   Cardiovascular:  Negative for chest pain, palpitations and leg swelling.  Gastrointestinal:  Negative for abdominal pain, blood in stool, constipation and diarrhea.  Endocrine: Negative for polydipsia, polyphagia and polyuria.  Genitourinary:  Negative for dysuria and pelvic pain.  Musculoskeletal:  Negative for arthralgias.  Skin:  Negative for rash.  Neurological:  Negative for dizziness, weakness and headaches.  Hematological:  Negative for adenopathy. Does not bruise/bleed easily.  Psychiatric/Behavioral:  Negative for sleep disturbance and suicidal ideas. The patient is not nervous/anxious.        Objective:     BP 134/76   Pulse 68   Temp 97.6 F (36.4 C)   Ht 5'  7" (1.702 m)   Wt 169 lb 3.2 oz (76.7 kg)   SpO2 97%   BMI 26.50 kg/m   Wt Readings from Last 3 Encounters:  08/20/21 169 lb 3.2 oz (76.7 kg)  07/15/21 169 lb 12.8 oz (77 kg)  05/03/21 167 lb (75.8 kg)    BP Readings from Last 3 Encounters:  08/20/21 134/76  07/15/21 (!) 156/89  05/03/21 131/85     Physical Exam Vitals and nursing note reviewed.  Constitutional:      Appearance: Normal appearance. She is normal weight. She is not toxic-appearing.  HENT:     Head: Normocephalic and atraumatic.     Right Ear: Tympanic membrane, ear canal and external ear normal.     Left Ear: Tympanic membrane, ear canal and external ear normal.     Nose: Nose normal.     Mouth/Throat:     Mouth: Mucous membranes are moist.  Eyes:     Extraocular Movements: Extraocular movements intact.     Conjunctiva/sclera: Conjunctivae normal.     Pupils: Pupils are equal, round, and reactive to light.  Cardiovascular:     Rate and Rhythm: Normal rate and regular rhythm.     Pulses: Normal pulses.     Heart sounds: Normal heart sounds.  Pulmonary:     Effort: Pulmonary effort is normal.     Breath sounds: Normal breath sounds.  Abdominal:     General: Abdomen is flat. Bowel sounds are normal.     Palpations: Abdomen is soft.     Hernia: A hernia (non-reducible, non strangulated umbilical hernia) is present.  Musculoskeletal:        General: Normal range of motion.     Cervical back: Normal range of motion and neck supple.  Skin:    General: Skin is warm and dry.     Comments: Diffuse solar lentigos  Neurological:     General: No focal deficit present.     Mental Status: She is alert and oriented to person, place, and time.  Psychiatric:        Mood and Affect: Mood normal.        Behavior: Behavior normal.        Thought Content: Thought content normal.        Judgment: Judgment normal.       Assessment & Plan:   Problem List Items Addressed This Visit   None Visit Diagnoses      Encounter for annual physical exam    -  Primary   Relevant Orders   CBC with Differential/Platelet   Comprehensive  metabolic panel   Lipid panel   Diabetes mellitus screening       Relevant Orders   Comprehensive metabolic panel   Screening for cholesterol level       Relevant Orders   Lipid panel   Essential hypertension       Relevant Orders   CBC with Differential/Platelet   Comprehensive metabolic panel   Umbilical hernia without obstruction and without gangrene       Relevant Orders   Ambulatory referral to General Surgery       Age-appropriate screening and counseling performed today. Will check labs and call with results. Preventive measures discussed and printed in AVS for patient.  Her blood pressure looks great.  She will continue on the Hyzaar daily.  She is up-to-date with her health maintenance.  She will get her flu shot and COVID booster at her pharmacy.  She is going to check on her Shingrix vaccine coverage.  I have also referred her to general surgery today for a change in her umbilical hernia.  She knows that she needs to go to the emergency department should it ever become very tender or red or blue in color.  Plan to follow-up in about 6 months for medication and lab recheck.  This note was prepared with assistance of Systems analyst. Occasional wrong-word or sound-a-like substitutions may have occurred due to the inherent limitations of voice recognition software.   Keylee Shrestha M Jaeleigh Monaco, PA-C

## 2021-09-06 ENCOUNTER — Ambulatory Visit (INDEPENDENT_AMBULATORY_CARE_PROVIDER_SITE_OTHER): Payer: 59 | Admitting: Internal Medicine

## 2021-09-06 ENCOUNTER — Other Ambulatory Visit: Payer: Self-pay

## 2021-09-06 VITALS — BP 136/80 | HR 99 | Ht 67.0 in | Wt 167.0 lb

## 2021-09-06 DIAGNOSIS — E042 Nontoxic multinodular goiter: Secondary | ICD-10-CM | POA: Diagnosis not present

## 2021-09-06 LAB — TSH: TSH: 2.22 mIU/L (ref 0.40–4.50)

## 2021-09-06 LAB — T4, FREE: Free T4: 1.2 ng/dL (ref 0.8–1.8)

## 2021-09-06 NOTE — Progress Notes (Signed)
Name: Courtney Watson  MRN/ DOB: [id number], [birth year]    Age/ Sex: 60 y.o., female     PCP: Allwardt, Randa Evens, PA-C   Reason for Endocrinology Evaluation:  Left thyroid nodule      Initial Endocrinology Clinic Visit: 08/26/2019    PATIENT IDENTIFIER: Courtney Watson is a 60 y.o., female with a past medical history of Anxiety and Hx of colon cancer ( S/p resection 2005 ). She has followed with [facility] since 08/26/2019 for consultative assistance with management of her left thyroid nodule.   HISTORICAL SUMMARY:  Courtney Watson was noted to have a left thyroid nodule on exam at her Gyn physical in 2020. She has noted some fullness over the past few months, this has been stable.    No FH of thyroid nodule    Two maternal uncles with colon cancer  SUBJECTIVE:    Today (09/06/2021):  Courtney Watson is here for a follow up on MNG   Weight has been fluctuating  Denies any local neck symptoms Denies dysphagia   Alternates constipation with diarrhea  Denies palpitations    HISTORY:  Past Medical History:  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   Overview:  Clinically Left C4 Clinically Left C4   Cancer ([place]) 2005   colon    Cervical radiculopathy 01/10/2015   Colon polyps    Eating disorder    Fatigue 01/25/2018   Fibroid    H/O rhinoplasty    History of chicken pox    Irregular heart beat 04/01/2018   Knee pain, right 08/22/2015   Left thyroid nodule    needs follow up ultrasound in 2021   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 Her smoking use included cigarettes. She has never used smokeless tobacco. She reports current alcohol use of about 4.0 standard drinks per week. She reports that she does not use drugs. Family History:  Family History  Problem Relation Age of Onset   Osteoporosis Mother    Hypertension Mother    Arthritis Mother    Hearing loss Mother    Cancer Father        prostate   Parkinson's disease Father        Dec age 65 from parkinsons/Lewey Body Dementia?   Hypertension Father    Hyperlipidemia Father    Osteoarthritis Father    Rheum arthritis Father    Cancer Maternal Uncle 85       colon cancer   Depression Maternal Grandmother    Diabetes Maternal Grandfather    Heart disease Maternal Grandfather    Hypertension Maternal Grandfather    Breast cancer Paternal Grandmother  Cancer Paternal Grandfather      HOME MEDICATIONS: Allergies as of 09/06/2021   No Known Allergies      Medication List        Accurate as of September 06, 2021  3:38 PM. If you have any questions, ask your nurse or doctor.          ALPRAZolam 0.5 MG tablet Commonly known as: XANAX Take 1 tablet (0.5 mg total) by mouth 3 (three) times daily as needed.   Biotin 10 MG Caps Take by mouth.   CALCIUM 500 PO Take 1 tablet by mouth 2 (two) times daily.   cyanocobalamin 1000 MCG/ML injection Commonly known as: (VITAMIN B-12) Inject 49mL into muscle q 30 days   HAIR/SKIN/NAILS/BIOTIN PO Take by mouth.   losartan-hydrochlorothiazide 50-12.5 MG tablet Commonly known as: HYZAAR TAKE ONE TABLET BY MOUTH DAILY   sertraline 100 MG tablet Commonly known as: ZOLOFT Take 1 tablet (100 mg total) by  mouth daily.   SYRINGE-NEEDLE (DISP) 3 ML 23G X 1" 3 ML Misc 1 Device by Does not apply route every 30 (thirty) days.   Vitamin D3 125 MCG (5000 UT) Caps Take 1 capsule by mouth daily.          OBJECTIVE:   PHYSICAL EXAM: VS: BP 136/80 (BP Location: Left Arm, Patient Position: Sitting, Cuff Size: Small)   Pulse 99   Ht 5\' 7"  (1.702 m)   Wt 167 lb (75.8 kg)   SpO2 (!) 88%   BMI 26.16 kg/m    EXAM: General: Pt appears well and is in NAD  Neck: General: Supple without adenopathy. Thyroid: Thyroid size normal.  No goiter or nodules appreciated.   Lungs: Clear with good BS bilat with no rales, rhonchi, or wheezes  Heart: Auscultation: RRR.  Abdomen: Normoactive bowel sounds, soft, nontender, without masses or organomegaly palpable  Extremities:  BL LE: No pretibial edema normal ROM and strength.  Mental Status: Judgment, insight: Intact Orientation: Oriented to time, place, and person Memory: Intact for recent and remote events Mood and affect: No depression, anxiety, or agitation     DATA REVIEWED:  Results for RACHELLE, EDWARDS (MRN [id number]) as of 09/09/2021 12:14  Ref. Range 09/06/2021 15:43  TSH Latest Ref Range: 0.40 - 4.50 mIU/L 2.22  T4,Free(Direct) Latest Ref Range: 0.8 - 1.8 ng/dL 1.2    Thyroid Ultrasound 09/11/2020  Nodule # 1:   Location: Left; Mid   Maximum size: 1.5, unchanged cm; Other 2 dimensions: 1.1 x 1.0 cm   Composition: solid/almost completely solid (2)   Echogenicity: isoechoic (1)   Shape: not taller-than-wide (0)   Margins: smooth (0)   Echogenic foci: none (0)   ACR TI-RADS total points: 3.   ACR TI-RADS risk category: TR3 (3 points).   ACR TI-RADS recommendations:   *Given size (>/= 1.5 - 2.4 cm) and appearance, a follow-up ultrasound in 1 year should be considered based on TI-RADS criteria.   _________________________________________________________   Minor thyroid heterogeneity. There are a few additional  scattered subcentimeter hypoechoic nodules noted, also 3 mm or less in size. These would not meet criteria for any biopsy or follow-up and are not fully described by TI rads criteria.   No hypervascularity or regional adenopathy.   IMPRESSION: Stable 1.5 cm left mid thyroid TR 3 nodule meets criteria for continued follow-up annually.    ASSESSMENT / PLAN / RECOMMENDATIONS:   Multinodular Goiter:   - No local neck symptoms  - Clinically and biochemically euthyroid  -  Left nodule has been stable, discussed monitoring for ~ 5 yrs for stability  - We reviewed S/S of hyperthyroidism with the pt     F/U in 1 yr     Signed electronically by: Mack Guise, MD  [facility]  [address] [place], [place] [address] Phone: [phone] FAX: [phone]      CC: Allwardt, Randa Evens, PA-C [place] [address] Phone: [phone]  Fax: [phone]   Return to Endocrinology clinic as below: Future Appointments  Date Time Provider [place]  10/16/2021 10:30 AM Nunzio Cobbs, MD [facility] None  02/19/2022  9:00 AM Allwardt, Randa Evens, PA-C [facility]  08/11/2022  3:30 PM Cottle, Billey Co., MD [facility] None

## 2021-09-11 ENCOUNTER — Other Ambulatory Visit: Payer: Self-pay

## 2021-09-11 ENCOUNTER — Ambulatory Visit
Admission: RE | Admit: 2021-09-11 | Discharge: 2021-09-11 | Disposition: A | Discharge status: Home / Self Care | Payer: 59 | Source: Ambulatory Visit | Attending: Internal Medicine | Admitting: Internal Medicine

## 2021-09-11 DIAGNOSIS — E042 Nontoxic multinodular goiter: Secondary | ICD-10-CM

## 2021-10-02 ENCOUNTER — Other Ambulatory Visit: Payer: Self-pay | Admitting: Physician Assistant

## 2021-10-14 ENCOUNTER — Ambulatory Visit: Payer: Self-pay | Admitting: Obstetrics and Gynecology

## 2021-10-16 ENCOUNTER — Ambulatory Visit: Payer: Self-pay | Admitting: Obstetrics and Gynecology

## 2021-10-28 ENCOUNTER — Ambulatory Visit (INDEPENDENT_AMBULATORY_CARE_PROVIDER_SITE_OTHER): Payer: 59 | Admitting: Obstetrics and Gynecology

## 2021-10-28 ENCOUNTER — Other Ambulatory Visit: Payer: Self-pay

## 2021-10-28 ENCOUNTER — Encounter: Payer: Self-pay | Admitting: Obstetrics and Gynecology

## 2021-10-28 VITALS — BP 118/72 | HR 88 | Ht 67.0 in | Wt 169.0 lb

## 2021-10-28 DIAGNOSIS — Z01419 Encounter for gynecological examination (general) (routine) without abnormal findings: Secondary | ICD-10-CM

## 2021-10-28 NOTE — Patient Instructions (Signed)

## 2021-10-28 NOTE — Progress Notes (Signed)
60 y.o. G0P0000 Married Caucasian female here for annual exam.    No gynecologic concerns.   Will see a surgeon about her umbilical hernia.   PCP:  Alyssa Allwardt, PA-C   No LMP recorded. Patient has had an ablation.           Sexually active: Yes.    The current method of family planning is post menopausal status/Hx of ablation.    Exercising: No.  The patient does not participate in regular exercise at present. Smoker:  no--Does Vape Daily  Health Maintenance: Pap:   03-19-18 Neg:Neg HR HPV, 10/2015 Neg:Neg HR HPV, 03-10-12 Neg History of abnormal Pap:  no MMG:  07-11-21 3D/Neg/Birads1 Colonoscopy:  2018 normal per patient;next due 2023 due to family hx and personal hx of colon polyps.  Dr. [facility]. BMD: 06-27-19  Result :Osteopenia, multiple sites TDaP:  07-20-19 Gardasil:   no HIV: 04-06-20 NR Hep C: 2017 Neg Screening Labs:  PCP Flu and Covid boosters completed.  Shingrix x 1 completed.    reports that she quit smoking about 9 years ago. Her smoking use included cigarettes. She has never used smokeless tobacco. She reports current alcohol use of about 4.0 standard drinks per week. She reports that she does not use drugs.  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   Malignant tumor of colon ([place]) 04/01/2018   Neck pain 09/12/2014   Osteopenia 2018   hips and spine   Palpitations 04/01/2018    Perimenopausal disorder 04/01/2018   Seasonal allergies    Shortness of breath on exertion 06/08/2017   Ulcerative colitis ([place])    Uterine leiomyoma 04/01/2018   Vitamin D deficiency     Past Surgical History:  Procedure Laterality Date   COLON SURGERY  2005   colon cancer--Dr.Rosenbower   ENDOMETRIAL ABLATION  2012   Dr.Lomax   MYOMECTOMY  2012   Dr.Lomax    Current Outpatient Medications  Medication Sig Dispense Refill   ALPRAZolam (XANAX) 0.5 MG tablet Take 1 tablet (0.5 mg total) by mouth 3 (three) times daily as needed. 90 tablet 1   Biotin 10 MG CAPS Take by mouth.     Calcium Carbonate (CALCIUM 500 PO) Take 1 tablet by mouth 2 (two) times daily.     Cholecalciferol (VITAMIN D3) 125 MCG (5000 UT) CAPS Take 1 capsule by mouth daily.     cyanocobalamin (,VITAMIN B-12,) 1000 MCG/ML injection Inject 41mL into muscle q 30 days 10 mL 0   losartan-hydrochlorothiazide (HYZAAR) 50-12.5 MG tablet Take 1 tablet by mouth daily. 90 tablet 1   Multiple Vitamins-Minerals (HAIR/SKIN/NAILS/BIOTIN PO) Take by mouth.     sertraline (ZOLOFT) 100 MG tablet Take 1 tablet (100 mg total) by mouth daily. 90 tablet 3   SYRINGE-NEEDLE, DISP, 3 ML  23G X 1" 3 ML MISC 1 Device by Does not apply route every 30 (thirty) days. 50 each 1   No current facility-administered medications for this visit.    Family History  Problem Relation Age of Onset   Osteoporosis Mother    Hypertension Mother    Arthritis Mother    Hearing loss Mother    Cancer Father        prostate   Parkinson's disease Father        Dec age 57 from parkinsons/Lewey Body Dementia?   Hypertension Father    Hyperlipidemia Father    Osteoarthritis Father    Rheum arthritis Father    Cancer Maternal Uncle 37       colon cancer   Depression Maternal Grandmother    Diabetes Maternal Grandfather    Heart disease Maternal Grandfather    Hypertension Maternal Grandfather    Breast cancer Paternal Grandmother    Cancer Paternal  Grandfather     Review of Systems  All other systems reviewed and are negative.  Exam:   BP 118/72   Pulse 88   Ht 5\' 7"  (1.702 m)   Wt 169 lb (76.7 kg)   SpO2 98%   BMI 26.47 kg/m     General appearance: alert, cooperative and appears stated age Head: normocephalic, without obvious abnormality, atraumatic Neck: no adenopathy, supple, symmetrical, trachea midline and thyroid normal to inspection and palpation Lungs: clear to auscultation bilaterally Breasts: normal appearance, no masses or tenderness, No nipple retraction or dimpling, No nipple discharge or bleeding, No axillary adenopathy Heart: regular rate and rhythm Abdomen: soft, non-tender; no masses, no organomegaly.  Umbilical hernia. Extremities: extremities normal, atraumatic, no cyanosis or edema Skin: skin color, texture, turgor normal. No rashes or lesions Lymph nodes: cervical, supraclavicular, and axillary nodes normal. Neurologic: grossly normal  Pelvic: External genitalia:  no lesions              No abnormal inguinal nodes palpated.              Urethra:  normal appearing urethra with no masses, tenderness or lesions              Bartholins and Skenes: normal                 Vagina: normal appearing vagina with normal color and discharge, no lesions              Cervix: no lesions              Pap taken: no Bimanual Exam:  Uterus:  normal size, contour, position, consistency, mobility, non-tender              Adnexa: no mass, fullness, tenderness              Rectal exam: yes.  Confirms.              Anus:  normal sphincter tone, no lesions  Chaperone was present for exam:  Estill Bamberg, CMA  Assessment:   Well woman visit with gynecologic exam. Status post endometrial ablation and myomectomy.  Small umbilical hernia. Hx osteopenia of hips and spine.  Low risk for fracture. Hx vit D deficiency.  Personal hx colon cancer and collagenous colitis. Vaping.   Plan: Mammogram screening discussed. Self breast  awareness reviewed. Pap and HR HPV as above. Guidelines for Calcium, Vitamin D, regular exercise program including cardiovascular and weight bearing exercise. [facility]MS at [facility].  Not able to quit vaping  at this time.  Labs with PCp.  Follow up annually and prn.   After visit summary provided.

## 2021-11-20 ENCOUNTER — Encounter: Payer: Self-pay | Admitting: Obstetrics and Gynecology

## 2021-11-21 DIAGNOSIS — I10 Essential (primary) hypertension: Secondary | ICD-10-CM | POA: Insufficient documentation

## 2021-11-21 NOTE — Progress Notes (Signed)
Cardiology Office Note:    Date:  11/22/2021   ID:  Courtney Watson, DOB 03-11-1961, MRN [id number]  PCP:  Courtney Lathe, PA-C  Cardiologist:  Courtney More, MD    Referring MD: Courtney Lathe, PA-C    ASSESSMENT:    1. PVC (premature ventricular contraction)   2. APC (atrial premature contractions)   3. Primary hypertension    PLAN:    In order of problems listed above:  Today she is having frequent PVCs and interesting when monitored in the past she had frequent atrial premature contractions.  Symptoms are not severe I offered her a beta-blocker she declined her and repeat a ZIO monitor to reassess her heart rhythm. Stable continue current antihypertensive blood pressure at home other physicians office is in range   Next appointment: 1 year   Medication Adjustments/Labs and Tests Ordered: Current medicines are reviewed at length with the patient today.  Concerns regarding medicines are outlined above.  Orders Placed This Encounter  Procedures   LONG TERM MONITOR (3-14 DAYS)   EKG 12-Lead   No orders of the defined types were placed in this encounter.   Chief Complaint  Patient presents with   Follow-up    For frequent atrial premature contractions    History of Present Illness:    Courtney Watson is a 61 y.o. female with a hx of palpitation with symptomatic APCs with a 13.3% burden with brief runs of atrial tachycardia on extended ambulatory monitor and right bundle branch block last seen 11/02/2020 with elevated blood pressure and initiated on an ARB for hypertension.. Compliance with diet, lifestyle and medications: Yes  She had a severe reaction to shingles vaccine including shortness of breath for few days She is on 1 episode of blanching of her finger in cold weather lasting 20 minutes In the evening she has mild palpitation at rest not severe or sustained No edema chest pain syncope Past Medical History:  Diagnosis Date   Abnormal uterine bleeding     when she had fibroid tumor   Anxiety    Blood in stool    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   Overview:  Clinically Left C4 Clinically Left C4   Cancer ([place]) 2005   colon    Cervical radiculopathy 01/10/2015   Colon polyps    Eating disorder    Fatigue 01/25/2018   Fibroid    H/O rhinoplasty    History of chicken pox    Hypertension    Irregular heart beat 04/01/2018   Knee pain, right 08/22/2015   Left thyroid nodule    needs follow up ultrasound in 2021   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   Malignant tumor of colon ([place]) 04/01/2018   Neck pain 09/12/2014   Osteopenia 2018   hips and spine   Palpitations 04/01/2018   Perimenopausal disorder 04/01/2018   Seasonal allergies    Shortness of breath on exertion 06/08/2017   Ulcerative colitis ([place])    Uterine leiomyoma 04/01/2018   Vitamin D deficiency     Past Surgical History:  Procedure Laterality Date   COLON SURGERY  2005   colon cancer--Dr.Rosenbower   ENDOMETRIAL ABLATION  2012   Dr.Lomax   MYOMECTOMY  2012   Dr.Lomax    Current Medications: Current Meds  Medication Sig   ALPRAZolam (XANAX) 0.5 MG tablet Take 1 tablet (0.5 mg total) by mouth 3 (three) times daily as needed.   Biotin 10 MG CAPS Take by mouth.  Calcium Carbonate (CALCIUM 500 PO) Take 1 tablet by mouth 2 (two) times daily.   Cholecalciferol (VITAMIN D3) 125 MCG (5000 UT) CAPS Take 1 capsule by mouth daily.   cyanocobalamin (,VITAMIN B-12,) 1000 MCG/ML injection Inject 56mL into muscle q 30 days   losartan-hydrochlorothiazide (HYZAAR) 50-12.5 MG tablet Take 1 tablet by mouth daily.   Multiple Vitamins-Minerals (HAIR/SKIN/NAILS/BIOTIN PO) Take by mouth.   sertraline (ZOLOFT) 100 MG tablet Take 1 tablet (100 mg total) by mouth daily.   SYRINGE-NEEDLE, DISP, 3 ML 23G X 1" 3 ML MISC 1 Device by Does not apply route every 30 (thirty) days.     Allergies:   Patient has no known allergies.   Social History    Socioeconomic History   Marital status: Married    Spouse name: Not on file   Number of children: Not on file   Years of education: Not on file   Highest education level: Not on file  Occupational History   Not on file  Tobacco Use   Smoking status: Former    Types: Cigarettes    Quit date: 11/18/2011    Years since quitting: 10.0    Passive exposure: Never   Smokeless tobacco: Never  Vaping Use   Vaping Use: Never used  Substance and Sexual Activity   Alcohol use: Yes    Alcohol/week: 4.0 standard drinks    Types: 4 Cans of beer per week    Comment: on the weekends   Drug use: No   Sexual activity: Yes    Partners: Male    Birth control/protection: Post-menopausal    Comment: Ablation 2012  Other Topics Concern   Not on file  Social History Narrative   Not on file   Social Determinants of Health   Financial Resource Strain: Not on file  Food Insecurity: Not on file  Transportation Needs: Not on file  Physical Activity: Not on file  Stress: Not on file  Social Connections: Not on file     Family History: The patient's family history includes Arthritis in her mother; Breast cancer in her paternal grandmother; Cancer in her father and paternal grandfather; Cancer (age of onset: 69) in her maternal uncle; Depression in her maternal grandmother; Diabetes in her maternal grandfather; Hearing loss in her mother; Heart disease in her maternal grandfather; Hyperlipidemia in her father; Hypertension in her father, maternal grandfather, and mother; Osteoarthritis in her father; Osteoporosis in her mother; Parkinson's disease in her father; Rheum arthritis in her father. ROS:   Please see the history of present illness.    All other systems reviewed and are negative.  EKGs/Labs/Other Studies Reviewed:    The following studies were reviewed today:  EKG:  EKG ordered today and personally reviewed.  The ekg ordered today demonstrates sinus rhythm right bundle branch block and  frequent PVCs  Recent Labs: 08/20/2021: ALT 20; BUN 14; Creatinine, Ser 0.56; Hemoglobin 13.1; Platelets 228.0; Potassium 4.0; Sodium 137 09/06/2021: TSH 2.22  Recent Lipid Panel    Component Value Date/Time   CHOL 225 (H) 08/20/2021 0959   CHOL 240 (H) 07/20/2019 1624   TRIG 97.0 08/20/2021 0959   HDL 94.70 08/20/2021 0959   HDL 123 07/20/2019 1624   CHOLHDL 2 08/20/2021 0959   VLDL 19.4 08/20/2021 0959   LDLCALC 111 (H) 08/20/2021 0959   LDLCALC 96 07/20/2019 1624    Physical Exam:    VS:  BP (!) 152/86    Pulse 80    Ht 5\' 7"  (1.702  m)    Wt 169 lb (76.7 kg)    SpO2 98%    BMI 26.47 kg/m     Wt Readings from Last 3 Encounters:  11/22/21 169 lb (76.7 kg)  10/28/21 169 lb (76.7 kg)  09/06/21 167 lb (75.8 kg)     GEN:  Well nourished, well developed in no acute distress HEENT: Normal NECK: No JVD; No carotid bruits LYMPHATICS: No lymphadenopathy CARDIAC: RRR, no murmurs, rubs, gallops RESPIRATORY:  Clear to auscultation without rales, wheezing or rhonchi  ABDOMEN: Soft, non-tender, non-distended MUSCULOSKELETAL:  No edema; No deformity  SKIN: Warm and dry NEUROLOGIC:  Alert and oriented x 3 PSYCHIATRIC:  Normal affect    Signed, Courtney More, MD  11/22/2021 3:47 PM    [facility]

## 2021-11-22 ENCOUNTER — Ambulatory Visit (INDEPENDENT_AMBULATORY_CARE_PROVIDER_SITE_OTHER): Payer: 59

## 2021-11-22 ENCOUNTER — Other Ambulatory Visit: Payer: Self-pay

## 2021-11-22 ENCOUNTER — Ambulatory Visit (INDEPENDENT_AMBULATORY_CARE_PROVIDER_SITE_OTHER): Payer: 59 | Admitting: Cardiology

## 2021-11-22 ENCOUNTER — Encounter: Payer: Self-pay | Admitting: Cardiology

## 2021-11-22 VITALS — BP 152/86 | HR 80 | Ht 67.0 in | Wt 169.0 lb

## 2021-11-22 DIAGNOSIS — I10 Essential (primary) hypertension: Secondary | ICD-10-CM

## 2021-11-22 DIAGNOSIS — I491 Atrial premature depolarization: Secondary | ICD-10-CM | POA: Diagnosis not present

## 2021-11-22 DIAGNOSIS — I493 Ventricular premature depolarization: Secondary | ICD-10-CM

## 2021-11-22 NOTE — Patient Instructions (Signed)
Medication Instructions:  Your physician recommends that you continue on your current medications as directed. Please refer to the Current Medication list given to you today.  *If you need a refill on your cardiac medications before your next appointment, please call your pharmacy*   Lab Work: None If you have labs (blood work) drawn today and your tests are completely normal, you will receive your results only by: [place] (if you have MyChart) OR A paper copy in the mail If you have any lab test that is abnormal or we need to change your treatment, we will call you to review the results.   Testing/Procedures: A zio monitor was ordered today. It will remain on for 7 days. You will then return monitor and event diary in provided box. It takes 1-2 weeks for report to be downloaded and returned to [place]. We will call you with the results. If monitor falls off or has orange flashing light, please call Zio for further instructions.     Follow-Up: At [facility], you and your health needs are our priority.  As part of our continuing mission to provide you with exceptional heart care, we have created designated Provider Care Teams.  These Care Teams include your primary Cardiologist (physician) and Advanced Practice Providers (APPs -  Physician Assistants and Nurse Practitioners) who all work together to provide you with the care you need, when you need it.  We recommend signing up for the patient portal called "MyChart".  Sign up information is provided on this After Visit Summary.  MyChart is used to connect with patients for Virtual Visits (Telemedicine).  Patients are able to view lab/test results, encounter notes, upcoming appointments, etc.  Non-urgent messages can be sent to your provider as well.   To learn more about what you can do with MyChart, go to [email or web address].    Your next appointment:   1 year(s)  The format for your next appointment:   In Person  Provider:    Shirlee More, MD    Other Instructions

## 2021-12-09 ENCOUNTER — Telehealth: Payer: Self-pay

## 2021-12-09 DIAGNOSIS — I493 Ventricular premature depolarization: Secondary | ICD-10-CM

## 2021-12-09 NOTE — Telephone Encounter (Signed)
-----   Message from Richardo Priest, MD sent at 12/08/2021 11:36 AM EST ----- Please apologize to her for the delay with covering the hospital and the office I just could not get to the monitors until this weekend.  This is different she is having frequent ventricular premature beats as opposed to her previous frequent atrial premature beats and the symptomatic events are when they are frequent and especially when they are more than 1 at a time.  Would really like her to see EP Dr. Curt Bears regarding further evaluation and treatment this is a complicated decision.  I will send a copy of this note to him.

## 2021-12-09 NOTE — Telephone Encounter (Signed)
Spoke with patient regarding results and recommendation.  Patient verbalizes understanding and is agreeable to plan of care. Advised patient to call back with any issues or concerns.  

## 2021-12-09 NOTE — Telephone Encounter (Signed)
Left message on patients voicemail to please return our call.   

## 2022-02-10 ENCOUNTER — Institutional Professional Consult (permissible substitution): Payer: 59 | Admitting: Cardiology

## 2022-02-19 ENCOUNTER — Ambulatory Visit (INDEPENDENT_AMBULATORY_CARE_PROVIDER_SITE_OTHER): Payer: 59 | Admitting: Physician Assistant

## 2022-02-19 VITALS — BP 146/99 | HR 79 | Temp 98.0°F | Ht 67.0 in | Wt 170.0 lb

## 2022-02-19 DIAGNOSIS — I10 Essential (primary) hypertension: Secondary | ICD-10-CM | POA: Diagnosis not present

## 2022-02-19 DIAGNOSIS — E7800 Pure hypercholesterolemia, unspecified: Secondary | ICD-10-CM

## 2022-02-19 DIAGNOSIS — F419 Anxiety disorder, unspecified: Secondary | ICD-10-CM | POA: Diagnosis not present

## 2022-02-19 LAB — LIPID PANEL
Cholesterol: 230 mg/dL — ABNORMAL HIGH (ref 0–200)
HDL: 103.6 mg/dL (ref >39.00)
LDL Cholesterol: 106 mg/dL — ABNORMAL HIGH (ref 0–99)
NonHDL: 126.64
Total CHOL/HDL Ratio: 2
Triglycerides: 104 mg/dL (ref 0.0–149.0)
VLDL: 20.8 mg/dL (ref 0.0–40.0)

## 2022-02-19 MED ORDER — LOSARTAN POTASSIUM-HCTZ 100-12.5 MG PO TABS: 1.0000 | ORAL_TABLET | Freq: Every day | ORAL | 1 refills | Status: DC

## 2022-02-19 NOTE — Progress Notes (Signed)
? ?Subjective:  ? ? Patient ID: Courtney Watson, female    DOB: 04/05/1961, 61 y.o.   MRN: [id number] ? ?Chief Complaint  ?Patient presents with  ? Follow-up  ? ? ?HPI ?Patient is in today for regular 6 mo f/up. ? ?HTN- ?Infrequently checks at home ?130s/90s usual trend ?Says today is abnormally high for her ?Trying to exercise and mindful of diet, not eating a lot of meat or fried foods ?Mom and sister with hx, on medications ?Takes Hyzaar 50-12.5 mg daily ?Hasn't smoked since 2013  ? ?Cholesterol issues run in family as well.  ?No early heart disease or stroke in the family (none before age 70).  ? ?EP appt with Dr. Curt Bears next month about palpitations ? ? ?Past Medical History:  ?Diagnosis Date  ? Abnormal uterine bleeding   ? when she had fibroid tumor  ? Anxiety   ? Blood in stool   ? 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  ? Overview:  Clinically Left C4 Clinically Left C4  ? Cancer [facility]) 2005  ? colon   ? Cervical radiculopathy 01/10/2015  ? Colon polyps   ? Eating disorder   ? Fatigue 01/25/2018  ? Fibroid   ? H/O rhinoplasty   ? History of chicken pox   ? Hypertension   ? Irregular heart beat 04/01/2018  ? Knee pain, right 08/22/2015  ? Left thyroid nodule   ? needs follow up ultrasound in 2021  ? 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 Malignant tumor of colon ([place]) 04/01/2018  ? Neck pain 09/12/2014  ? Osteopenia 2018  ? hips and spine  ? Palpitations 04/01/2018  ? Perimenopausal disorder 04/01/2018  ? Seasonal allergies   ? Shortness of breath on exertion 06/08/2017  ? Ulcerative colitis ([place])   ? Uterine leiomyoma 04/01/2018  ? Vitamin D deficiency   ? ? ?Past Surgical History:  ?Procedure Laterality Date  ? COLON SURGERY  2005  ? colon cancer--Dr.Rosenbower  ? ENDOMETRIAL ABLATION  2012  ? Dr.Lomax  ? MYOMECTOMY  2012  ? Dr.Lomax  ? ? ?Family History  ?Problem Relation Age of Onset  ? Osteoporosis Mother   ? Hypertension Mother   ? Arthritis Mother   ? Hearing loss Mother   ?  Cancer Father   ?     prostate  ? Parkinson's disease Father   ?     Dec age 22 from parkinsons/Lewey Body Dementia?  ? Hypertension Father   ? Hyperlipidemia Father   ? Osteoarthritis Father   ? Rheum arthritis Father   ? Cancer Maternal Uncle 8  ?     colon cancer  ? Depression Maternal Grandmother   ? Diabetes Maternal Grandfather   ? Heart disease Maternal Grandfather   ? Hypertension Maternal Grandfather   ? Breast cancer Paternal Grandmother   ? Cancer Paternal Grandfather   ? ? ?Social History  ? ?Tobacco Use  ? Smoking status: Former  ?  Types: Cigarettes  ?  Quit date: 11/18/2011  ?  Years since quitting: 10.2  ?  Passive exposure: Never  ? Smokeless tobacco: Never  ?Vaping Use  ? Vaping Use: Never used  ?Substance Use Topics  ? Alcohol use: Yes  ?  Alcohol/week: 4.0 standard drinks  ?  Types: 4 Cans of beer per week  ?  Comment: on the weekends  ? Drug use: No  ?  ? ?No Known Allergies ? ?Review of Systems ?NEGATIVE UNLESS OTHERWISE INDICATED IN HPI ? ? ?   ?  Objective:  ?  ? ?BP (!) 146/99   Pulse 79   Temp 98 ?F (36.7 ?C)   Ht '5\' 7"'$  (1.702 m)   Wt 170 lb (77.1 kg)   SpO2 98%   BMI 26.63 kg/m?  ? ?Wt Readings from Last 3 Encounters:  ?02/19/22 170 lb (77.1 kg)  ?11/22/21 169 lb (76.7 kg)  ?10/28/21 169 lb (76.7 kg)  ? ? ?BP Readings from Last 3 Encounters:  ?02/19/22 (!) 146/99  ?11/22/21 (!) 152/86  ?10/28/21 118/72  ?  ? ?Physical Exam ?Vitals and nursing note reviewed.  ?Constitutional:   ?   Appearance: Normal appearance. She is normal weight. She is not toxic-appearing.  ?HENT:  ?   Head: Normocephalic and atraumatic.  ?   Right Ear: External ear normal.  ?   Left Ear: External ear normal.  ?   Nose: Nose normal.  ?   Mouth/Throat:  ?   Mouth: Mucous membranes are moist.  ?Eyes:  ?   Extraocular Movements: Extraocular movements intact.  ?   Conjunctiva/sclera: Conjunctivae normal.  ?   Pupils: Pupils are equal, round, and reactive to light.  ?Cardiovascular:  ?   Rate and Rhythm: Normal rate and  regular rhythm.  ?   Pulses: Normal pulses.  ?   Heart sounds: Normal heart sounds.  ?Pulmonary:  ?   Effort: Pulmonary effort is normal.  ?   Breath sounds: Normal breath sounds.  ?Musculoskeletal:     ?   General: Normal range of motion.  ?   Cervical back: Normal range of motion and neck supple.  ?Skin: ?   General: Skin is warm and dry.  ?Neurological:  ?   General: No focal deficit present.  ?   Mental Status: She is alert and oriented to person, place, and time.  ?Psychiatric:     ?   Mood and Affect: Mood normal.     ?   Behavior: Behavior normal.     ?   Thought Content: Thought content normal.     ?   Judgment: Judgment normal.  ? ? ?   ?Assessment & Plan:  ? ?Problem List Items Addressed This Visit   ? ?  ? Cardiovascular and Mediastinum  ? Essential hypertension - Primary  ? Relevant Medications  ? losartan-hydrochlorothiazide (HYZAAR) 100-12.5 MG tablet  ?  ? Other  ? Anxiety  ? High cholesterol  ? Relevant Medications  ? losartan-hydrochlorothiazide (HYZAAR) 100-12.5 MG tablet  ? Other Relevant Orders  ? Lipid panel  ? ? ? ?Meds ordered this encounter  ?Medications  ? losartan-hydrochlorothiazide (HYZAAR) 100-12.5 MG tablet  ?  Sig: Take 1 tablet by mouth daily.  ?  Dispense:  90 tablet  ?  Refill:  1  ? ?1. Essential hypertension ?Blood pressure is elevated and not at goal ?Plan to increase Hyzaar to 100-125 mg daily ?Prescription refilled for her ?Track blood pressure at home and continue to work on lifestyle goals ? ?2. Anxiety ?Currently stable and actually doing better overall.  She has not needed to use the Xanax since the pandemic has calmed down.  She is to continue on Zoloft 100 mg daily and call for refills as needed. ? ?3. High cholesterol ?Plan to recheck fasting lipid panel today ?She needs to consider CT cardiac score and can talk with cardiology about this as well ?Again she continues to work on lifestyle changes ? ?Plan for regular follow-up in about 6 months or as needed ? ? ?  This note  was prepared with assistance of Systems analyst. Occasional wrong-word or sound-a-like substitutions may have occurred due to the inherent limitations of voice recognition software. ? ? ? ?Amel Kitch M Delainey Winstanley, PA-C ?

## 2022-02-19 NOTE — Patient Instructions (Addendum)
Schedule for punch biopsy skin lesion ?F/up with me in 6 months or as needed ? ?Track palpitations / frequency / severity ?Consider CT Calcium Score ? ?Fasting lipid panel today ? ?Inc Hyzaar to 100-12.5 mg daily ?Track BP at home  ? ?Cont Zoloft daily ?Xanax only prn panic ?

## 2022-03-17 ENCOUNTER — Ambulatory Visit: Payer: 59 | Admitting: Cardiology

## 2022-03-17 ENCOUNTER — Encounter: Payer: Self-pay | Admitting: Cardiology

## 2022-03-17 VITALS — BP 136/90 | HR 88 | Ht 67.5 in | Wt 172.4 lb

## 2022-03-17 DIAGNOSIS — I493 Ventricular premature depolarization: Secondary | ICD-10-CM

## 2022-03-17 NOTE — Patient Instructions (Signed)
Medication Instructions:  Your physician recommends that you continue on your current medications as directed. Please refer to the Current Medication list given to you today.  *If you need a refill on your cardiac medications before your next appointment, please call your pharmacy*   Lab Work: None ordered   Testing/Procedures: None ordered   Follow-Up: At [facility], you and your health needs are our priority.  As part of our continuing mission to provide you with exceptional heart care, we have created designated Provider Care Teams.  These Care Teams include your primary Cardiologist (physician) and Advanced Practice Providers (APPs -  Physician Assistants and Nurse Practitioners) who all work together to provide you with the care you need, when you need it.  Your next appointment:   as  needed  The format for your next appointment:   In Person  Provider:   Will Camnitz, MD    Thank you for choosing [facility]!!   Aviendha Azbell, RN [phone]        

## 2022-03-17 NOTE — Progress Notes (Signed)
? ?Electrophysiology Office Note ? ? ?Date:  03/17/2022  ? ?ID:  Zaya Kessenich, DOB [birth year], MRN [id number] ? ?PCP:  Allwardt, Randa Evens, PA-C  ?Cardiologist:  Bettina Gavia ?Primary Electrophysiologist:  Kostantinos Tallman Meredith Leeds, MD   ? ?Chief Complaint: PVC ?  ?History of Present Illness: ?Courtney Watson is a 61 y.o. female who is being seen today for the evaluation of PVC at the request of Bettina Gavia, Hilton Cork, MD. Presenting today for electrophysiology evaluation. ? ?She has a history significant for colon cancer, hypertension, ulcerative colitis, PACs, and PVCs.  She recently wore a cardiac monitor with a 3.7% supraventricular ectopy burden and 7.4% ventricular ectopy burden. ? ?Today, she denies symptoms of palpitations, chest pain, shortness of breath, orthopnea, PND, lower extremity edema, claudication, dizziness, presyncope, syncope, bleeding, or neurologic sequela. The patient is tolerating medications without difficulties.  She has intermittent palpitations.  Her palpitations occur mainly when she is trying to sleep.  She states that when she lays on her left side, she can feel her heart beating.  Otherwise, she has been doing well.  She has no complaints at this time.  She is able do all of her daily activities. ? ? ?Past Medical History:  ?Diagnosis Date  ? Abnormal uterine bleeding   ? when she had fibroid tumor  ? Anxiety   ? Blood in stool   ? 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  ? Overview:  Clinically Left C4 Clinically Left C4  ? Cancer [facility]) 2005  ? colon   ? Cervical radiculopathy 01/10/2015  ? Colon polyps   ? Eating disorder   ? Fatigue 01/25/2018  ? Fibroid   ? H/O rhinoplasty   ? History of chicken pox   ? Hypertension   ? Irregular heart beat 04/01/2018  ? Knee pain, right 08/22/2015  ? Left thyroid nodule   ? needs follow up ultrasound in 2021  ? Malignant neoplasm of large intestine ([place]) 09/12/2014  ? Malignant tumor of colon ([place]) 04/01/2018  ? Neck pain 09/12/2014  ? Osteopenia 2018  ? hips  and spine  ? Palpitations 04/01/2018  ? Perimenopausal disorder 04/01/2018  ? Seasonal allergies   ? Shortness of breath on exertion 06/08/2017  ? Ulcerative colitis ([place])   ? Uterine leiomyoma 04/01/2018  ? Vitamin D deficiency   ? ?Past Surgical History:  ?Procedure Laterality Date  ? COLON SURGERY  2005  ? colon cancer--Dr.Rosenbower  ? ENDOMETRIAL ABLATION  2012  ? Dr.Lomax  ? MYOMECTOMY  2012  ? Dr.Lomax  ? ? ? ?Current Outpatient Medications  ?Medication Sig Dispense Refill  ? ALPRAZolam (XANAX) 0.5 MG tablet Take 1 tablet (0.5 mg total) by mouth 3 (three) times daily as needed. 90 tablet 1  ? Biotin 10 MG CAPS Take by mouth.    ? Calcium Carbonate (CALCIUM 500 PO) Take 1 tablet by mouth 2 (two) times daily.    ? Cholecalciferol (VITAMIN D3) 125 MCG (5000 UT) CAPS Take 1 capsule by mouth daily.    ? cyanocobalamin (,VITAMIN B-12,) 1000 MCG/ML injection Inject 108m into muscle q 30 days 10 mL 0  ? losartan-hydrochlorothiazide (HYZAAR) 100-12.5 MG tablet Take 1 tablet by mouth daily. 90 tablet 1  ? Multiple Vitamins-Minerals (HAIR/SKIN/NAILS/BIOTIN PO) Take by mouth.    ? sertraline (ZOLOFT) 100 MG tablet Take 1 tablet (100 mg total) by mouth daily. 90 tablet 3  ? SYRINGE-NEEDLE, DISP, 3 ML 23G X 1" 3 ML MISC 1 Device by Does not apply route every 30 (thirty)  days. 50 each 1  ? ?No current facility-administered medications for this visit.  ? ? ?Allergies:   Patient has no known allergies.  ? ?Social History:  The patient  reports that she quit smoking about 10 years ago. Her smoking use included cigarettes. She has never been exposed to tobacco smoke. She has never used smokeless tobacco. She reports current alcohol use of about 4.0 standard drinks per week. She reports that she does not use drugs.  ? ?Family History:  The patient's family history includes Arthritis in her mother; Breast cancer in her paternal grandmother; Cancer in her father and paternal grandfather; Cancer (age of onset: 4) in her maternal  uncle; Depression in her maternal grandmother; Diabetes in her maternal grandfather; Hearing loss in her mother; Heart disease in her maternal grandfather; Hyperlipidemia in her father; Hypertension in her father, maternal grandfather, and mother; Osteoarthritis in her father; Osteoporosis in her mother; Parkinson's disease in her father; Rheum arthritis in her father.  ? ? ?ROS:  Please see the history of present illness.   Otherwise, review of systems is positive for none.   All other systems are reviewed and negative.  ? ? ?PHYSICAL EXAM: ?VS:  BP 136/90   Pulse 88   Ht 5' 7.5" (1.715 m)   Wt 172 lb 6.4 oz (78.2 kg)   SpO2 94%   BMI 26.60 kg/m?  , BMI Body mass index is 26.6 kg/m?. ?GEN: Well nourished, well developed, in no acute distress  ?HEENT: normal  ?Neck: no JVD, carotid bruits, or masses ?Cardiac: RRR; no murmurs, rubs, or gallops,no edema  ?Respiratory:  clear to auscultation bilaterally, normal work of breathing ?GI: soft, nontender, nondistended, + BS ?MS: no deformity or atrophy  ?Skin: warm and dry ?Neuro:  Strength and sensation are intact ?Psych: euthymic mood, full affect ? ?EKG:  EKG is ordered today. ?Personal review of the ekg ordered shows sinus rhythm, PVCs, rate 88 ? ?Recent Labs: ?08/20/2021: ALT 20; BUN 14; Creatinine, Ser 0.56; Hemoglobin 13.1; Platelets 228.0; Potassium 4.0; Sodium 137 ?09/06/2021: TSH 2.22  ? ? ?Lipid Panel  ?   ?Component Value Date/Time  ? CHOL 230 (H) 02/19/2022 0946  ? CHOL 240 (H) 07/20/2019 1624  ? TRIG 104.0 02/19/2022 0946  ? HDL 103.60 02/19/2022 0946  ? HDL 123 07/20/2019 1624  ? CHOLHDL 2 02/19/2022 0946  ? VLDL 20.8 02/19/2022 0946  ? [place] 106 (H) 02/19/2022 0946  ? [place] 96 07/20/2019 1624  ? ? ? ?Wt Readings from Last 3 Encounters:  ?03/17/22 172 lb 6.4 oz (78.2 kg)  ?02/19/22 170 lb (77.1 kg)  ?11/22/21 169 lb (76.7 kg)  ?  ? ? ?Other studies Reviewed: ?Additional studies/ records that were reviewed today include: TTE 2019 ?Review of the above  records today demonstrates:  ?- Left ventricle: The cavity size was normal. Wall thickness was  ?  normal. Systolic function was normal. The estimated ejection  ?  fraction was in the range of 50% to 55%. Wall motion was normal;  ?  there were no regional wall motion abnormalities. Doppler  ?  parameters are consistent with abnormal left ventricular  ?  relaxation (grade 1 diastolic dysfunction).  ?- Left atrium: The atrium was mildly dilated.  ? ?Cardiac monitor 12/08/2021 personally reviewed ?Frequent ventricular ectopy is seen with a burden of 7.4%, couplets were 2.8% and her 577 triplets noted single morphology. ?Supraventricular ectopy is occasional 3.7% with 20 runs of atrial premature contractions longest 19 complexes rate 147,  paroxysmal atrial tachycardia.  There are no episodes of atrial fibrillation or flutter. ? ?ASSESSMENT AND PLAN: ? ?1.  PVCs: Monitor with a burden of 7.4%.  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  She states that she feels her PVCs when she is trying to sleep at night, but otherwise is unaware.  She had an echo in 2019 that showed a normal ejection fraction.  As she is minimally aware from her PVCs, and her burden is only 7.4%, she would prefer watchful waiting.  If she does develop further symptoms, would plan for medication management.  We Hilde Churchman see her back on an as-needed basis. ? ?Case discussed with primary cardiology ? ?Current medicines are reviewed at length with the patient today.   ?The patient does not have concerns regarding her medicines.  The following changes were made today:  none ? ?Labs/ tests ordered today include:  ?Orders Placed This Encounter  ?Procedures  ? EKG 12-Lead  ? ? ? ?Disposition:   FU with Joanne Salah as needed d ? ?Signed, ?Oral Remache Meredith Leeds, MD  ?03/17/2022 9:21 AM    ? ?[facility] ?[address] ?[address] ?[place] [address] ?[phone] (office) ?[phone] (fax) ? ?

## 2022-05-06 ENCOUNTER — Other Ambulatory Visit: Payer: Self-pay | Admitting: Physician Assistant

## 2022-06-09 ENCOUNTER — Other Ambulatory Visit: Payer: Self-pay | Admitting: Physician Assistant

## 2022-07-06 ENCOUNTER — Other Ambulatory Visit: Payer: Self-pay | Admitting: Physician Assistant

## 2022-07-17 LAB — HM MAMMOGRAPHY

## 2022-07-29 LAB — HM COLONOSCOPY

## 2022-07-31 ENCOUNTER — Encounter: Payer: Self-pay | Admitting: Physician Assistant

## 2022-08-07 ENCOUNTER — Other Ambulatory Visit: Payer: Self-pay | Admitting: Physician Assistant

## 2022-08-11 ENCOUNTER — Other Ambulatory Visit: Payer: Self-pay | Admitting: Physician Assistant

## 2022-08-11 ENCOUNTER — Encounter: Payer: Self-pay | Admitting: Psychiatry

## 2022-08-11 ENCOUNTER — Encounter: Payer: Self-pay | Admitting: *Deleted

## 2022-08-11 ENCOUNTER — Ambulatory Visit (INDEPENDENT_AMBULATORY_CARE_PROVIDER_SITE_OTHER): Payer: 59 | Admitting: Psychiatry

## 2022-08-11 DIAGNOSIS — F40233 Fear of injury: Secondary | ICD-10-CM | POA: Diagnosis not present

## 2022-08-11 DIAGNOSIS — F410 Panic disorder [episodic paroxysmal anxiety] without agoraphobia: Secondary | ICD-10-CM | POA: Diagnosis not present

## 2022-08-11 MED ORDER — SERTRALINE HCL 100 MG PO TABS: 100.0000 mg | ORAL_TABLET | Freq: Every day | ORAL | 3 refills | Status: DC

## 2022-08-11 MED ORDER — ALPRAZOLAM 0.5 MG PO TABS: 0.5000 mg | ORAL_TABLET | Freq: Three times a day (TID) | ORAL | 0 refills | Status: AC | PRN

## 2022-08-11 NOTE — Progress Notes (Signed)
[name] [id number] 1961/07/04 61 y.o.  Subjective:   Patient ID:  Courtney Watson is a 61 y.o. (DOB 02-17-1961) female.  Chief Complaint:  Chief Complaint  Patient presents with   Follow-up    Panic disorder without agoraphobia   Anxiety    HPI Courtney Watson presents to the office today for follow-up of anxiety.  08/08/20 appt without med changes: Cont sertraline 100 and about 10 Xanax/month.  08/08/21 appt noted: Pretty good.  Around mid May at [facility] The had panic attack.  Taking Xanax again when going out after that.  Disappointed at having to take it again.  Anxiety is a little worse in the car again.  Good when she's driving.  Fiddles with fingers when H drives.   No SE. Plan: Continue sertraline 100 and prn Xanax. High relapse risk without it.  08/11/22 appt noted: Fantastic.  Anxiety manageable.  3 panic since here situalionally in a restaurant. Using Xanax prn when goes out to restaurant if needed like it's busy. Consistent with sertraline. No SE with either.   Xanax works if needed. No spontaneous panic attacks lately.  Can trigger herself to feel throat close up if talks.  Review of Systems:  Review of Systems  Cardiovascular:  Negative for chest pain and palpitations.  Neurological:  Negative for tremors.    Medications: I have reviewed the patient's current medications.  Current Outpatient Medications  Medication Sig Dispense Refill   Biotin 10 MG CAPS Take by mouth.     Calcium Carbonate (CALCIUM 500 PO) Take 1 tablet by mouth 2 (two) times daily.     Cholecalciferol (VITAMIN D3) 125 MCG (5000 UT) CAPS Take 1 capsule by mouth daily.     cyanocobalamin (VITAMIN B12) 1000 MCG/ML injection INJECT ONE MILLILTER INTO THE MUSCLE EVERY 30 DAYS 1 mL 0   losartan-hydrochlorothiazide (HYZAAR) 100-12.5 MG tablet Take 1 tablet by mouth daily. 90 tablet 1   Multiple Vitamins-Minerals (HAIR/SKIN/NAILS/BIOTIN PO) Take by mouth.     SYRINGE-NEEDLE, DISP, 3 ML 23G X 1" 3  ML MISC 1 Device by Does not apply route every 30 (thirty) days. 50 each 1   ALPRAZolam (XANAX) 0.5 MG tablet Take 1 tablet (0.5 mg total) by mouth 3 (three) times daily as needed. 60 tablet 0   sertraline (ZOLOFT) 100 MG tablet Take 1 tablet (100 mg total) by mouth daily. 90 tablet 3   No current facility-administered medications for this visit.    Medication Side Effects: None  Allergies: No Known Allergies  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   Overview:  Clinically Left C4 Clinically Left C4   Cancer ([place]) 2005   colon    Cervical radiculopathy 01/10/2015   Colon polyps    Eating disorder    Fatigue 01/25/2018   Fibroid    H/O rhinoplasty    History of chicken pox    Hypertension    Irregular heart beat 04/01/2018   Knee pain, right 08/22/2015   Left thyroid nodule    needs follow up ultrasound in 2021   Malignant neoplasm of large intestine ([place]) 09/12/2014   Malignant tumor of colon ([place]) 04/01/2018   Neck pain 09/12/2014   Osteopenia 2018   hips and spine   Palpitations 04/01/2018   Perimenopausal disorder 04/01/2018   Seasonal allergies    Shortness of  breath on exertion 06/08/2017   Ulcerative colitis [facility])    Uterine leiomyoma 04/01/2018   Vitamin D deficiency     Family History  Problem Relation Age of Onset   Osteoporosis Mother    Hypertension Mother    Arthritis Mother    Hearing loss Mother    Cancer Father        prostate   Parkinson's disease Father        Dec age 31 from parkinsons/Lewey Body Dementia?   Hypertension Father    Hyperlipidemia Father    Osteoarthritis Father    Rheum arthritis Father    Cancer Maternal Uncle 20       colon cancer   Depression Maternal Grandmother    Diabetes Maternal Grandfather    Heart disease Maternal Grandfather    Hypertension Maternal Grandfather    Breast cancer  Paternal Grandmother    Cancer Paternal Grandfather     Social History   Socioeconomic History   Marital status: Married    Spouse name: Not on file   Number of children: Not on file   Years of education: Not on file   Highest education level: Not on file  Occupational History   Not on file  Tobacco Use   Smoking status: Former    Types: Cigarettes    Quit date: 11/18/2011    Years since quitting: 10.7    Passive exposure: Never   Smokeless tobacco: Never  Vaping Use   Vaping Use: Never used  Substance and Sexual Activity   Alcohol use: Yes    Alcohol/week: 4.0 standard drinks of alcohol    Types: 4 Cans of beer per week    Comment: on the weekends   Drug use: No   Sexual activity: Yes    Partners: Male    Birth control/protection: Post-menopausal    Comment: Ablation 2012  Other Topics Concern   Not on file  Social History Narrative   Not on file   Social Determinants of Health   Financial Resource Strain: Not on file  Food Insecurity: Not on file  Transportation Needs: Not on file  Physical Activity: Not on file  Stress: Not on file  Social Connections: Not on file  Intimate Partner Violence: Not on file    Past Medical History, Surgical history, Social history, and Family history were reviewed and updated as appropriate.   Please see review of systems for further details on the patient's review from today.   Objective:   Physical Exam:  There were no vitals taken for this visit.  Physical Exam Constitutional:      General: She is not in acute distress.    Appearance: She is well-developed.  Neurological:     Mental Status: She is alert and oriented to person, place, and time.     Cranial Nerves: No dysarthria.  Psychiatric:        Attention and Perception: She is attentive. She does not perceive auditory or visual hallucinations.        Mood and Affect: Mood is anxious. Mood is not depressed. Affect is not labile, blunt, angry or inappropriate.         Speech: Speech normal.        Behavior: Behavior normal.        Thought Content: Thought content normal. Thought content is not delusional. Thought content does not include homicidal or suicidal ideation. Thought content does not include suicidal plan.        Cognition and  Memory: Cognition normal.        Judgment: Judgment normal.     Comments: Insight intact. No auditory or visual hallucinations. No delusions.  She still has some anxiety around eating and requires the Xanax prn eatin in restaurant but it is manageable and much less severe than in the past.  Some anxiety in car also     Lab Review:     Component Value Date/Time   NA 137 08/20/2021 0959   NA 142 11/29/2020 1124   K 4.0 08/20/2021 0959   CL 99 08/20/2021 0959   CO2 28 08/20/2021 0959   GLUCOSE 90 08/20/2021 0959   BUN 14 08/20/2021 0959   BUN 12 11/29/2020 1124   CREATININE 0.56 08/20/2021 0959   CREATININE 0.59 03/18/2017 1506   CALCIUM 9.7 08/20/2021 0959   PROT 7.2 08/20/2021 0959   PROT 7.1 07/20/2019 1624   ALBUMIN 4.4 08/20/2021 0959   ALBUMIN 4.7 07/20/2019 1624   AST 24 08/20/2021 0959   ALT 20 08/20/2021 0959   ALKPHOS 95 08/20/2021 0959   BILITOT 0.5 08/20/2021 0959   BILITOT 0.2 07/20/2019 1624   GFRNONAA 101 11/29/2020 1124   GFRAA 116 11/29/2020 1124       Component Value Date/Time   WBC 6.4 08/20/2021 0959   RBC 4.20 08/20/2021 0959   HGB 13.1 08/20/2021 0959   HGB 12.5 07/20/2019 1624   HCT 39.3 08/20/2021 0959   HCT 38.8 07/20/2019 1624   PLT 228.0 08/20/2021 0959   PLT 250 07/20/2019 1624   MCV 93.4 08/20/2021 0959   MCV 93 07/20/2019 1624   MCH 30.0 07/20/2019 1624   MCH 31.2 03/18/2017 1506   MCHC 33.2 08/20/2021 0959   RDW 13.9 08/20/2021 0959   RDW 12.4 07/20/2019 1624   LYMPHSABS 1.4 08/20/2021 0959   MONOABS 0.7 08/20/2021 0959   EOSABS 0.3 08/20/2021 0959   BASOSABS 0.0 08/20/2021 0959    No results found for: "POCLITH", "LITHIUM"   No results found for:  "PHENYTOIN", "PHENOBARB", "VALPROATE", "CBMZ"   .res Assessment: Plan:    Panic disorder without agoraphobia - Plan: ALPRAZolam (XANAX) 0.5 MG tablet, sertraline (ZOLOFT) 100 MG tablet  Fear of choking - Plan: ALPRAZolam (XANAX) 0.5 MG tablet   Sharyn Lull has a history of panic disorder and a fear of choking.  It has been extreme and caused a great deal of avoidance in the past she would avoid eating out.  Now she can eat out although she does have to take half of 0.5 mg Xanax in order to do so.  She gets overall antipanic effects from the sertraline. Continue sertraline 100 and prn Xnax.  High relapse risk without meds.  She still has some anxiety around eating and requires the Xanax prn eatin in restaurant but it is manageable and much less severe than in the past.  Some anxiety in car also  No med changes today  overall doing well.   She still has some anxiety around eating and requires the Xanax prn eatin in restaurant but it is manageable and much less severe than in the past.  Some anxiety in car also  Follow-up 1 year  We discussed the short-term risks associated with benzodiazepines including sedation and increased fall risk among others.  Discussed long-term side effect risk including dependence, potential withdrawal symptoms.  Lynder Parents, MD, DFAPA   Please see After Visit Summary for patient specific instructions.  Future Appointments  Date Time Provider Remy  09/12/2022  3:00  PM Shamleffer, Melanie Crazier, MD [id number] None  10/29/2022 10:30 AM Amundson Raliegh Ip, MD [facility] None    No orders of the defined types were placed in this encounter.     -------------------------------

## 2022-08-20 ENCOUNTER — Encounter: Payer: Self-pay | Admitting: Physician Assistant

## 2022-09-08 ENCOUNTER — Other Ambulatory Visit: Payer: Self-pay | Admitting: Physician Assistant

## 2022-09-12 ENCOUNTER — Ambulatory Visit: Payer: 59 | Admitting: Internal Medicine

## 2022-09-12 ENCOUNTER — Ambulatory Visit (INDEPENDENT_AMBULATORY_CARE_PROVIDER_SITE_OTHER): Payer: 59 | Admitting: Internal Medicine

## 2022-09-12 ENCOUNTER — Encounter: Payer: Self-pay | Admitting: Internal Medicine

## 2022-09-12 VITALS — BP 118/76 | HR 78 | Ht 67.5 in | Wt 168.0 lb

## 2022-09-12 DIAGNOSIS — E042 Nontoxic multinodular goiter: Secondary | ICD-10-CM | POA: Diagnosis not present

## 2022-09-12 NOTE — Progress Notes (Unsigned)
Name: Courtney Watson  MRN/ DOB: [id number], 08/15/1961    Age/ Sex: 61 y.o., female     PCP: Allwardt, Randa Evens, PA-C   Reason for Endocrinology Evaluation:  Left thyroid nodule      Initial Endocrinology Clinic Visit: 08/26/2019    PATIENT IDENTIFIER: Courtney Watson is a 61 y.o., female with a past medical history of Anxiety and Hx of colon cancer ( S/p resection 2005 ). She has followed with [facility] since 08/26/2019 for consultative assistance with management of her left thyroid nodule.   HISTORICAL SUMMARY:  Courtney Watson was noted to have a left thyroid nodule on exam at her Gyn physical in 2020. She has noted some fullness over the past few months, this has been stable.    No FH of thyroid nodule    Two maternal uncles with colon cancer  SUBJECTIVE:    Today (09/12/2022):  Courtney Watson is here for a follow up on MNG   Denies any local neck symptoms Denies dysphagia  Has chronic palpitations has been worked up in the past   Alternates constipation with diarrhea  due to IBS  Denies palpitations    HISTORY:  Past Medical History:  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   Overview:  Clinically Left C4 Clinically Left C4   Cancer ([place]) 2005   colon    Cervical radiculopathy 01/10/2015   Colon polyps    Eating disorder    Fatigue 01/25/2018   Fibroid    H/O rhinoplasty    History of chicken pox    Hypertension    Irregular heart beat 04/01/2018   Knee pain, right 08/22/2015   Left thyroid nodule    needs follow up ultrasound in 2021   Malignant neoplasm of large intestine ([place]) 09/12/2014   Malignant tumor of colon ([place]) 04/01/2018   Neck pain 09/12/2014   Osteopenia 2018   hips and spine   Palpitations 04/01/2018   Perimenopausal disorder 04/01/2018   Seasonal allergies    Shortness of breath on  exertion 06/08/2017   Ulcerative colitis ([place])    Uterine leiomyoma 04/01/2018   Vitamin D deficiency    Past Surgical History:  Past Surgical History:  Procedure Laterality Date   COLON SURGERY  2005   colon cancer--Dr.Rosenbower   ENDOMETRIAL ABLATION  2012   Dr.Lomax   MYOMECTOMY  2012   Dr.Lomax   Social History:  reports that she quit smoking about 10 years ago. Her smoking use included cigarettes. She has never been exposed to tobacco smoke. She has never used smokeless tobacco. She reports current alcohol use of about 4.0 standard drinks of alcohol per week. She reports that she does not use drugs. Family History:  Family History  Problem Relation Age of Onset   Osteoporosis Mother    Hypertension Mother    Arthritis Mother    Hearing loss Mother    Cancer Father        prostate   Parkinson's disease Father        Dec age 90 from parkinsons/Lewey Body Dementia?   Hypertension Father    Hyperlipidemia Father    Osteoarthritis Father    Rheum arthritis Father    Cancer Maternal Uncle 11       colon cancer   Depression Maternal Grandmother  Diabetes Maternal Grandfather    Heart disease Maternal Grandfather    Hypertension Maternal Grandfather    Breast cancer Paternal Grandmother    Cancer Paternal Grandfather      HOME MEDICATIONS: Allergies as of 09/12/2022   No Known Allergies      Medication List        Accurate as of September 12, 2022  3:28 PM. If you have any questions, ask your nurse or doctor.          ALPRAZolam 0.5 MG tablet Commonly known as: XANAX Take 1 tablet (0.5 mg total) by mouth 3 (three) times daily as needed.   Biotin 10 MG Caps Take by mouth.   CALCIUM 500 PO Take 1 tablet by mouth 2 (two) times daily.   cyanocobalamin 1000 MCG/ML injection Commonly known as: VITAMIN B12 INJECT 1 ML INTO THE MUSCLE EVERY 30 DAYS   HAIR/SKIN/NAILS/BIOTIN PO Take by mouth.   losartan-hydrochlorothiazide 100-12.5 MG tablet Commonly  known as: HYZAAR TAKE ONE TABLET BY MOUTH DAILY   sertraline 100 MG tablet Commonly known as: ZOLOFT Take 1 tablet (100 mg total) by mouth daily.   SYRINGE-NEEDLE (DISP) 3 ML 23G X 1" 3 ML Misc 1 Device by Does not apply route every 30 (thirty) days.   Vitamin D3 125 MCG (5000 UT) Caps Take 1 capsule by mouth daily.          OBJECTIVE:   PHYSICAL EXAM: VS: BP 118/76 (BP Location: Left Arm, Patient Position: Sitting, Cuff Size: Small)   Pulse 78   Ht 5' 7.5" (1.715 m)   Wt 168 lb (76.2 kg)   SpO2 95%   BMI 25.92 kg/m    EXAM: General: Pt appears well and is in NAD  Neck: General: Supple without adenopathy. Thyroid: Thyroid size normal.  No goiter or nodules appreciated.   Lungs: Clear with good BS bilat with no rales, rhonchi, or wheezes  Heart: Auscultation: RRR.  Abdomen: Normoactive bowel sounds, soft, nontender, without masses or organomegaly palpable  Extremities:  BL LE: No pretibial edema normal ROM and strength.  Mental Status: Judgment, insight: Intact Orientation: Oriented to time, place, and person Mood and affect: No depression, anxiety, or agitation     DATA REVIEWED: ***   Thyroid Ultrasound 09/11/2021  Estimated total number of nodules >/= 1 cm: 1   Number of spongiform nodules >/=  2 cm not described below (TR1): 0   Number of mixed cystic and solid nodules >/= 1.5 cm not described below ([place]): 0   _________________________________________________________   Nodule # 1:   Prior biopsy: No   Location: Left; mid   Maximum size: 1.3 cm; Other 2 dimensions: 0.9 x 1.1 cm, previously, 1.5 x 1.1 x 1.0 cm   Composition: solid/almost completely solid (2)   Echogenicity: isoechoic (1)   Shape: not taller-than-wide (0)   Margins: ill-defined (0)   Echogenic foci: none (0)   ACR TI-RADS total points: 3.   ACR TI-RADS risk category:  TR3 (3 points).   Significant change in size (>/= 20% in two dimensions and minimal increase of 2  mm): No   Change in features: No   Change in ACR TI-RADS risk category: No   ACR TI-RADS recommendations:   Given size (<1.4 cm) and appearance, this nodule does NOT meet TI-RADS criteria for biopsy or dedicated follow-up.   _________________________________________________________   IMPRESSION: Previously seen TI-RADS 3 left thyroid nodule demonstrates interval decrease in size and no longer meets criteria for imaging  follow-up or FNA.   The above is in keeping with the ACR TI-RADS recommendations - J Am Coll Radiol 2017;14:587-595.       ASSESSMENT / PLAN / RECOMMENDATIONS:   Multinodular Goiter:   - No local neck symptoms  - Clinically and biochemically euthyroid  - Left nodule has been stable, discussed monitoring for ~ 5 yrs for stability  - We reviewed S/S of hyperthyroidism with the pt     F/U in 1 yr     Signed electronically by: Mack Guise, MD  [facility]  [facility] [address] [place], [place] [address] Phone: [phone] FAX: [phone]      CC: Allwardt, Randa Evens, PA-C [place] [id number] Phone: [phone]  Fax: [phone]   Return to Endocrinology clinic as below: Future Appointments  Date Time Provider [place]  10/29/2022 10:30 AM Nunzio Cobbs, MD [facility] None  08/12/2023  4:00 PM Cottle, Billey Co., MD [facility] None

## 2022-09-13 LAB — T4, FREE: Free T4: 1.17 ng/dL (ref 0.82–1.77)

## 2022-09-13 LAB — TSH: TSH: 1.59 u[IU]/mL (ref 0.450–4.500)

## 2022-09-16 ENCOUNTER — Ambulatory Visit
Admission: RE | Admit: 2022-09-16 | Discharge: 2022-09-16 | Disposition: A | Discharge status: Home / Self Care | Payer: 59 | Source: Ambulatory Visit | Attending: Internal Medicine | Admitting: Internal Medicine

## 2022-09-16 ENCOUNTER — Other Ambulatory Visit: Payer: Self-pay | Admitting: Physician Assistant

## 2022-09-16 DIAGNOSIS — E042 Nontoxic multinodular goiter: Secondary | ICD-10-CM

## 2022-10-14 ENCOUNTER — Other Ambulatory Visit: Payer: Self-pay | Admitting: Physician Assistant

## 2022-10-21 NOTE — Progress Notes (Deleted)
61 y.o. G12P0000 Married Caucasian female here for annual exam.    PCP:     No LMP recorded. Patient has had an ablation.           Sexually active: {yes no:314532}  The current method of family planning is {contraception:315051}.    Exercising: {yes no:314532}  {types:19826} Smoker:  {YES NO:22349}  Health Maintenance: Pap:  03-19-18 Neg:Neg HR HPV, 10/2015 Neg:Neg HR HPV, 03-10-12 Neg  History of abnormal Pap:  no MMG:  *** Colonoscopy:   2018 normal per patient;next due 2023 due to family hx and personal hx of colon polyps.  Dr. [facility].  BMD:   06/27/19  Result  osteopenia, multiple sites TDaP:  07/20/19 Gardasil:   no HIV:04/06/20 NR Hep C: 2017 Negative Screening Labs:  Hb today: ***, Urine today: ***   reports that she quit smoking about 10 years ago. Her smoking use included cigarettes. She has never been exposed to tobacco smoke. She has never used smokeless tobacco. She reports current alcohol use of about 4.0 standard drinks of alcohol per week. She reports that she does not use drugs.  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   Overview:  Clinically Left C4 Clinically Left C4   Cancer ([place]) 2005   colon    Cervical radiculopathy 01/10/2015   Colon polyps    Eating disorder    Fatigue 01/25/2018   Fibroid    H/O rhinoplasty    History of chicken pox    Hypertension    Irregular heart beat 04/01/2018   Knee pain, right 08/22/2015   Left thyroid nodule    needs follow up ultrasound in 2021   Malignant neoplasm of large intestine ([place]) 09/12/2014   Malignant tumor of colon ([place]) 04/01/2018   Neck pain 09/12/2014   Osteopenia 2018   hips and spine   Palpitations 04/01/2018   Perimenopausal disorder 04/01/2018   Seasonal allergies    Shortness of breath on exertion 06/08/2017   Ulcerative colitis ([place])    Uterine  leiomyoma 04/01/2018   Vitamin D deficiency     Past Surgical History:  Procedure Laterality Date   COLON SURGERY  2005   colon cancer--Dr.Rosenbower   ENDOMETRIAL ABLATION  2012   Dr.Lomax   MYOMECTOMY  2012   Dr.Lomax    Current Outpatient Medications  Medication Sig Dispense Refill   ALPRAZolam (XANAX) 0.5 MG tablet Take 1 tablet (0.5 mg total) by mouth 3 (three) times daily as needed. 60 tablet 0   Biotin 10 MG CAPS Take by mouth.     Calcium Carbonate (CALCIUM 500 PO) Take 1 tablet by mouth 2 (two) times daily.     Cholecalciferol (VITAMIN D3) 125 MCG (5000 UT) CAPS Take 1 capsule by mouth daily.     cyanocobalamin (VITAMIN B12) 1000 MCG/ML injection INJECT 1 ML INTO THE MUSCLE EVERY 30 DAYS 1 mL 0   losartan-hydrochlorothiazide (HYZAAR) 100-12.5 MG tablet TAKE ONE TABLET BY MOUTH DAILY 90 tablet 1   Multiple Vitamins-Minerals (HAIR/SKIN/NAILS/BIOTIN PO) Take by mouth.     sertraline (ZOLOFT) 100 MG tablet Take 1 tablet (100 mg total) by mouth daily. 90 tablet 3   SYRINGE-NEEDLE, DISP, 3 ML 23G X 1" 3 ML MISC 1 Device by Does not apply route every 30 (thirty) days. 50 each 1  No current facility-administered medications for this visit.    Family History  Problem Relation Age of Onset   Osteoporosis Mother    Hypertension Mother    Arthritis Mother    Hearing loss Mother    Cancer Father        prostate   Parkinson's disease Father        Dec age 52 from parkinsons/Lewey Body Dementia?   Hypertension Father    Hyperlipidemia Father    Osteoarthritis Father    Rheum arthritis Father    Cancer Maternal Uncle 51       colon cancer   Depression Maternal Grandmother    Diabetes Maternal Grandfather    Heart disease Maternal Grandfather    Hypertension Maternal Grandfather    Breast cancer Paternal Grandmother    Cancer Paternal Grandfather     Review of Systems  Exam:   There were no vitals taken for this visit.    General appearance: alert, cooperative and  appears stated age Head: normocephalic, without obvious abnormality, atraumatic Neck: no adenopathy, supple, symmetrical, trachea midline and thyroid normal to inspection and palpation Lungs: clear to auscultation bilaterally Breasts: normal appearance, no masses or tenderness, No nipple retraction or dimpling, No nipple discharge or bleeding, No axillary adenopathy Heart: regular rate and rhythm Abdomen: soft, non-tender; no masses, no organomegaly Extremities: extremities normal, atraumatic, no cyanosis or edema Skin: skin color, texture, turgor normal. No rashes or lesions Lymph nodes: cervical, supraclavicular, and axillary nodes normal. Neurologic: grossly normal  Pelvic: External genitalia:  no lesions              No abnormal inguinal nodes palpated.              Urethra:  normal appearing urethra with no masses, tenderness or lesions              Bartholins and Skenes: normal                 Vagina: normal appearing vagina with normal color and discharge, no lesions              Cervix: no lesions              Pap taken: {yes no:314532} Bimanual Exam:  Uterus:  normal size, contour, position, consistency, mobility, non-tender              Adnexa: no mass, fullness, tenderness              Rectal exam: {yes no:314532}.  Confirms.              Anus:  normal sphincter tone, no lesions  Chaperone was present for exam:  ***  Assessment:   Well woman visit with gynecologic exam.   Plan: Mammogram screening discussed. Self breast awareness reviewed. Pap and HR HPV as above. Guidelines for Calcium, Vitamin D, regular exercise program including cardiovascular and weight bearing exercise.   Follow up annually and prn.   Additional counseling given.  {yes [id number]. _______ minutes face to face time of which over 50% was spent in counseling.    After visit summary provided.

## 2022-10-29 ENCOUNTER — Ambulatory Visit: Payer: 59 | Admitting: Obstetrics and Gynecology

## 2022-11-07 ENCOUNTER — Encounter: Payer: Self-pay | Admitting: Physician Assistant

## 2022-11-07 ENCOUNTER — Ambulatory Visit (INDEPENDENT_AMBULATORY_CARE_PROVIDER_SITE_OTHER): Payer: 59 | Admitting: Physician Assistant

## 2022-11-07 VITALS — BP 134/72 | HR 94 | Temp 97.1°F | Ht 67.5 in | Wt 168.6 lb

## 2022-11-07 DIAGNOSIS — E7800 Pure hypercholesterolemia, unspecified: Secondary | ICD-10-CM | POA: Diagnosis not present

## 2022-11-07 DIAGNOSIS — M19049 Primary osteoarthritis, unspecified hand: Secondary | ICD-10-CM

## 2022-11-07 DIAGNOSIS — E538 Deficiency of other specified B group vitamins: Secondary | ICD-10-CM | POA: Diagnosis not present

## 2022-11-07 DIAGNOSIS — Z23 Encounter for immunization: Secondary | ICD-10-CM | POA: Diagnosis not present

## 2022-11-07 DIAGNOSIS — Z0000 Encounter for general adult medical examination without abnormal findings: Secondary | ICD-10-CM

## 2022-11-07 HISTORY — DX: Primary osteoarthritis, unspecified hand: M19.049

## 2022-11-07 MED ORDER — DICLOFENAC SODIUM 1 % EX GEL: 2.0000 g | Freq: Four times a day (QID) | CUTANEOUS | 0 refills | Status: DC

## 2022-11-07 NOTE — Progress Notes (Unsigned)
Subjective:    Patient ID: Courtney Watson, female    DOB: [birth year], 61 y.o.   MRN: [id number]  Chief Complaint  Patient presents with   Annual Exam    Pt in office for annual CPE and fasting labs; pt wants to ask about hands and hurting all the time, wondering about carpel tunnel;     HPI Patient is in today for annual exam. No medical changes since last visit.  Acute concerns: Bilateral hand pain. No stiffness. Occasionally having numbness on awakening.   Health maintenance: Lifestyle/ exercise: Walking a lot; wanting to get more weights in  Nutrition: Doing good overall  Mental health: Doing good, no concerns  Sleep: No issues  Substance use: Vaping ETOH: Occasional  Sexual activity: Monogamous  Immunizations: Completed Shingrix today  Colonoscopy: UTD  Pap: She will see GYN in Feb  Mammogram: UTD    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   Overview:  Clinically Left C4 Clinically Left C4   Cancer ([place]) 2005   colon    Cervical radiculopathy 01/10/2015   Colon polyps    Eating disorder    Fatigue 01/25/2018   Fibroid    H/O rhinoplasty    History of chicken pox    Hypertension    Irregular heart beat 04/01/2018   Knee pain, right 08/22/2015   Left thyroid nodule    needs follow up ultrasound in 2021   Malignant neoplasm of large intestine ([place]) 09/12/2014   Malignant tumor of colon ([place]) 04/01/2018   Neck pain 09/12/2014   Osteopenia 2018   hips and spine   Palpitations 04/01/2018   Perimenopausal disorder 04/01/2018   Seasonal allergies    Shortness of breath on exertion 06/08/2017   Ulcerative colitis ([place])    Uterine leiomyoma 04/01/2018   Vitamin D deficiency     Past Surgical History:  Procedure Laterality Date   COLON SURGERY  2005   colon cancer--Dr.Rosenbower   ENDOMETRIAL ABLATION  2012   Dr.Lomax    MYOMECTOMY  2012   Dr.Lomax    Family History  Problem Relation Age of Onset   Osteoporosis Mother    Hypertension Mother    Arthritis Mother    Hearing loss Mother    Cancer Father        prostate   Parkinson's disease Father        Dec age 73 from parkinsons/Lewey Body Dementia?   Hypertension Father    Hyperlipidemia Father    Osteoarthritis Father    Rheum arthritis Father    Cancer Maternal Uncle 94       colon cancer   Depression Maternal Grandmother    Diabetes Maternal Grandfather    Heart disease Maternal Grandfather    Hypertension Maternal Grandfather    Breast cancer Paternal Grandmother    Cancer Paternal Grandfather     Social History   Tobacco Use   Smoking status: Former    Types: Cigarettes    Quit date: 11/18/2011    Years since quitting: 10.9    Passive exposure: Never   Smokeless tobacco: Never  Vaping Use   Vaping Use: Never used  Substance Use Topics   Alcohol use: Yes    Alcohol/week: 4.0 standard drinks of alcohol    Types: 4 Cans of beer per week  Comment: on the weekends   Drug use: No     No Known Allergies  Review of Systems NEGATIVE UNLESS OTHERWISE INDICATED IN HPI      Objective:     BP 134/72 (BP Location: Left Arm)   Pulse 94   Temp (!) 97.1 F (36.2 C) (Temporal)   Ht 5' 7.5" (1.715 m)   Wt 168 lb 9.6 oz (76.5 kg)   SpO2 98%   BMI 26.02 kg/m   Wt Readings from Last 3 Encounters:  11/07/22 168 lb 9.6 oz (76.5 kg)  09/12/22 168 lb (76.2 kg)  03/17/22 172 lb 6.4 oz (78.2 kg)    BP Readings from Last 3 Encounters:  11/07/22 134/72  09/12/22 118/76  03/17/22 136/90     Physical Exam Vitals and nursing note reviewed.  Constitutional:      Appearance: Normal appearance. She is normal weight. She is not toxic-appearing.  HENT:     Head: Normocephalic and atraumatic.     Right Ear: Tympanic membrane, ear canal and external ear normal.     Left Ear: Tympanic membrane, ear canal and external ear normal.      Nose: Nose normal.     Mouth/Throat:     Mouth: Mucous membranes are moist.  Eyes:     Extraocular Movements: Extraocular movements intact.     Conjunctiva/sclera: Conjunctivae normal.     Pupils: Pupils are equal, round, and reactive to light.  Cardiovascular:     Rate and Rhythm: Normal rate and regular rhythm.     Pulses: Normal pulses.     Heart sounds: Normal heart sounds.  Pulmonary:     Effort: Pulmonary effort is normal.     Breath sounds: Normal breath sounds.  Abdominal:     General: Abdomen is flat. Bowel sounds are normal.     Palpations: Abdomen is soft.  Musculoskeletal:        General: Normal range of motion.     Cervical back: Normal range of motion and neck supple.  Skin:    General: Skin is warm and dry.  Neurological:     General: No focal deficit present.     Mental Status: She is alert and oriented to person, place, and time.  Psychiatric:        Mood and Affect: Mood normal.        Behavior: Behavior normal.        Thought Content: Thought content normal.        Judgment: Judgment normal.        Assessment & Plan:  Need for prophylactic vaccination and inoculation against varicella -     Varicella-zoster vaccine IM        No follow-ups on file.  This note was prepared with assistance of Systems analyst. Occasional wrong-word or sound-a-like substitutions may have occurred due to the inherent limitations of voice recognition software.  Time Spent: *** minutes of total time was spent on the date of the encounter performing the following actions: chart review prior to seeing the patient, obtaining history, performing a medically necessary exam, counseling on the treatment plan, placing orders, and documenting in our EHR.       Graviela Nodal M Mylea Roarty, PA-C

## 2022-11-08 ENCOUNTER — Other Ambulatory Visit: Payer: Self-pay | Admitting: Physician Assistant

## 2022-11-11 ENCOUNTER — Other Ambulatory Visit (INDEPENDENT_AMBULATORY_CARE_PROVIDER_SITE_OTHER): Payer: 59

## 2022-11-11 DIAGNOSIS — E538 Deficiency of other specified B group vitamins: Secondary | ICD-10-CM | POA: Diagnosis not present

## 2022-11-11 DIAGNOSIS — E7800 Pure hypercholesterolemia, unspecified: Secondary | ICD-10-CM | POA: Diagnosis not present

## 2022-11-11 DIAGNOSIS — Z0000 Encounter for general adult medical examination without abnormal findings: Secondary | ICD-10-CM

## 2022-11-11 LAB — LIPID PANEL
Cholesterol: 232 mg/dL — ABNORMAL HIGH (ref 0–200)
HDL: 96.7 mg/dL (ref >39.00)
LDL Cholesterol: 109 mg/dL — ABNORMAL HIGH (ref 0–99)
NonHDL: 135.11
Total CHOL/HDL Ratio: 2
Triglycerides: 132 mg/dL (ref 0.0–149.0)
VLDL: 26.4 mg/dL (ref 0.0–40.0)

## 2022-11-11 LAB — CBC WITH DIFFERENTIAL/PLATELET
Basophils Absolute: 0 10*3/uL (ref 0.0–0.1)
Basophils Relative: 0.3 % (ref 0.0–3.0)
Eosinophils Absolute: 0.3 10*3/uL (ref 0.0–0.7)
Eosinophils Relative: 4 % (ref 0.0–5.0)
HCT: 40.6 % (ref 36.0–46.0)
Hemoglobin: 13.4 g/dL (ref 12.0–15.0)
Lymphocytes Relative: 21.3 % (ref 12.0–46.0)
Lymphs Abs: 1.6 10*3/uL (ref 0.7–4.0)
MCHC: 32.9 g/dL (ref 30.0–36.0)
MCV: 92.5 fl (ref 78.0–100.0)
Monocytes Absolute: 1 10*3/uL (ref 0.1–1.0)
Monocytes Relative: 13 % — ABNORMAL HIGH (ref 3.0–12.0)
Neutro Abs: 4.6 10*3/uL (ref 1.4–7.7)
Neutrophils Relative %: 61.4 % (ref 43.0–77.0)
Platelets: 235 10*3/uL (ref 150.0–400.0)
RBC: 4.39 Mil/uL (ref 3.87–5.11)
RDW: 13.7 % (ref 11.5–15.5)
WBC: 7.5 10*3/uL (ref 4.0–10.5)

## 2022-11-11 LAB — COMPREHENSIVE METABOLIC PANEL
ALT: 23 U/L (ref 0–35)
AST: 26 U/L (ref 0–37)
Albumin: 4.4 g/dL (ref 3.5–5.2)
Alkaline Phosphatase: 100 U/L (ref 39–117)
BUN: 13 mg/dL (ref 6–23)
CO2: 25 mEq/L (ref 19–32)
Calcium: 9.5 mg/dL (ref 8.4–10.5)
Chloride: 99 mEq/L (ref 96–112)
Creatinine, Ser: 0.58 mg/dL (ref 0.40–1.20)
GFR: 98.99 mL/min (ref >60.00)
Glucose, Bld: 89 mg/dL (ref 70–99)
Potassium: 4 mEq/L (ref 3.5–5.1)
Sodium: 137 mEq/L (ref 135–145)
Total Bilirubin: 0.3 mg/dL (ref 0.2–1.2)
Total Protein: 7.4 g/dL (ref 6.0–8.3)

## 2022-11-11 LAB — VITAMIN B12: Vitamin B-12: 555 pg/mL (ref 211–911)

## 2022-11-12 NOTE — Assessment & Plan Note (Signed)
Plan to recheck labs today.  Continue on B12 injections most likely.

## 2022-11-12 NOTE — Assessment & Plan Note (Signed)
Plan to recheck lipid panel today.  She is currently not on any statin medications.

## 2022-11-12 NOTE — Assessment & Plan Note (Signed)
Age-appropriate screening and counseling performed today. Will check labs and call with results. Preventive measures discussed and printed in AVS for patient.  Shingrix completed today.  Patient Counseling: '[x]'$   Nutrition: Stressed importance of moderation in sodium/caffeine intake, saturated fat and cholesterol, caloric balance, sufficient intake of fresh fruits, vegetables, and fiber.  '[x]'$   Stressed the importance of regular exercise.   '[x]'$   Substance Abuse: Discussed cessation/primary prevention of tobacco, alcohol, or other drug use; driving or other dangerous activities under the influence; availability of treatment for abuse.   '[]'$   Injury prevention: Discussed safety belts, safety helmets, smoke detector, smoking near bedding or upholstery.   '[]'$   Sexuality: Discussed sexually transmitted diseases, partner selection, use of condoms, avoidance of unintended pregnancy  and contraceptive alternatives.   '[x]'$   Dental health: Discussed importance of regular tooth brushing, flossing, and dental visits.  '[x]'$   Health maintenance and immunizations reviewed. Please refer to Health maintenance section.

## 2022-11-12 NOTE — Assessment & Plan Note (Signed)
She is showing signs of osteoarthritis.  Strong family history.  Plan to start on Voltaren arthritis gel as directed.  She will let me know how she is doing with this.

## 2022-12-05 NOTE — Progress Notes (Signed)
62 y.o. G73P0000 Married Caucasian female here for annual exam.  Pt wants to discuss increased urinary frequency in the last month. Up 2 - 3 times a night to void.  No dysuria.  Drinking increased water.  Drinking more black tea also.   Traveling to [place] in September.  PCP:   Alyssa Allwardt, PA-C.  No LMP recorded. Patient has had an ablation.           Sexually active: Yes.    The current method of family planning is ablation.    Exercising: Yes.     Walking. Smoker:  former  Health Maintenance: Pap:  03/19/18 neg: HR HPV neg, 03/10/12 neg History of abnormal Pap:  no MMG:  07/17/22 Breast Density Category C, BI-RADS CATEGORY 1 Neg Colonoscopy:  07/29/22 - normal - FU 5 years.  BMD:   11/20/21  Result  osteopenia of hips and spine.  TDaP:  07/20/19 Gardasil:   no HIV: 2021 NR Hep C: 2017 Neg Screening Labs:  PCP Flu vaccine and Covid vaccine:  completed.  Shingrix:  completed.    reports that she quit smoking about 11 years ago. Her smoking use included cigarettes. She has never been exposed to tobacco smoke. She has never used smokeless tobacco. She reports current alcohol use of about 4.0 standard drinks of alcohol per week. She reports that she does not use drugs.  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   Malignant tumor of colon ([place]) 04/01/2018   Neck pain 09/12/2014   Osteopenia 2018   hips and spine    Palpitations 04/01/2018   Perimenopausal disorder 04/01/2018   Seasonal allergies    Shortness of breath on exertion 06/08/2017   Ulcerative colitis ([place])    Uterine leiomyoma 04/01/2018   Vitamin D deficiency     Past Surgical History:  Procedure Laterality Date   COLON SURGERY  2005   colon cancer--Dr.Rosenbower   ENDOMETRIAL ABLATION  2012   Dr.Lomax   MYOMECTOMY  2012   Dr.Lomax    Current Outpatient Medications  Medication Sig Dispense Refill   ALPRAZolam (XANAX) 0.5 MG tablet Take 1 tablet (0.5 mg total) by mouth 3 (three) times daily as needed. 60 tablet 0   Biotin 10 MG CAPS Take by mouth.     Calcium Carbonate (CALCIUM 500 PO) Take 1 tablet by mouth 2 (two) times daily.     Cholecalciferol (VITAMIN D3) 125 MCG (5000 UT) CAPS Take 1 capsule by mouth daily.     cyanocobalamin (VITAMIN B12) 1000 MCG/ML injection INJECT 1 ML INTO THE MUSCLE EVERY 30 DAYS 1 mL 0   diclofenac Sodium (VOLTAREN ARTHRITIS PAIN) 1 % GEL Apply 2 g topically 4 (four) times daily. 100 g 0   losartan-hydrochlorothiazide (HYZAAR) 100-12.5 MG tablet TAKE ONE TABLET  BY MOUTH DAILY 90 tablet 1   Multiple Vitamins-Minerals (HAIR/SKIN/NAILS/BIOTIN PO) Take by mouth.     sertraline (ZOLOFT) 100 MG tablet Take 1 tablet (100 mg total) by mouth daily. 90 tablet 3   SYRINGE-NEEDLE, DISP, 3 ML 23G X 1" 3 ML MISC 1 Device by Does not apply route every 30 (thirty) days. 50 each 1   No current facility-administered medications for this visit.    Family History  Problem Relation Age of Onset   Osteoporosis Mother    Hypertension Mother    Arthritis Mother    Hearing loss Mother    Cancer Father        prostate   Parkinson's disease Father        Dec age 15 from parkinsons/[place] Body Dementia?   Hypertension Father    Hyperlipidemia Father    Osteoarthritis Father    Rheum arthritis Father    Cancer Maternal Uncle 62       colon cancer   Depression Maternal Grandmother    Diabetes Maternal Grandfather     Heart disease Maternal Grandfather    Hypertension Maternal Grandfather    Breast cancer Paternal Grandmother    Cancer Paternal Grandfather     Review of Systems  All other systems reviewed and are negative.   Exam:   BP 130/84 (BP Location: Left Arm, Patient Position: Sitting, Cuff Size: Normal)   Pulse 90   Ht '5\' 7"'$  (1.702 m)   Wt 167 lb (75.8 kg)   SpO2 96%   BMI 26.16 kg/m     General appearance: alert, cooperative and appears stated age Head: normocephalic, without obvious abnormality, atraumatic Neck: no adenopathy, supple, symmetrical, trachea midline and thyroid normal to inspection and palpation Lungs: clear to auscultation bilaterally Breasts: normal appearance, no masses or tenderness, No nipple retraction or dimpling, No nipple discharge or bleeding, No axillary adenopathy Heart: regular rate and irregular rhythm. Abdomen: small umbilical hernia.  Abdomen is soft, non-tender; no masses, no organomegaly Extremities: extremities normal, atraumatic, no cyanosis or edema Skin: skin color, texture, turgor normal. No rashes or lesions Lymph nodes: cervical, supraclavicular, and axillary nodes normal. Neurologic: grossly normal  Pelvic: External genitalia:  no lesions              No abnormal inguinal nodes palpated.              Urethra:  normal appearing urethra with no masses, tenderness or lesions              Bartholins and Skenes: normal                 Vagina: normal appearing vagina with normal color and discharge, no lesions              Cervix: no lesions              Pap taken: yes Bimanual Exam:  Uterus:  normal size, contour, position, consistency, mobility, non-tender              Adnexa: no mass, fullness, tenderness              Rectal exam: yes.  Confirms.              Anus:  normal sphincter tone, no lesions  Chaperone was present for exam:  Santiago Glad  Assessment:   Well woman visit with gynecologic exam. Status post endometrial ablation and  myomectomy.  Small umbilical hernia. Hx osteopenia of hips and spine.  Low risk for fracture. Hx vit D deficiency.  Personal hx colon cancer and collagenous colitis. Urinary frequency.  Likely due to caffeine.  Plan: Mammogram screening discussed. Self breast awareness reviewed. Pap and HR HPV collected. Guidelines for Calcium, Vitamin D, regular exercise program including cardiovascular and weight bearing exercise. BMD at [facility] December, 2024 or January, 2025.  Bladder irritants reviewed.  Follow up annually and prn.   After visit summary provided.

## 2022-12-18 ENCOUNTER — Other Ambulatory Visit (HOSPITAL_COMMUNITY)
Admission: RE | Admit: 2022-12-18 | Discharge: 2022-12-18 | Disposition: A | Discharge status: Home / Self Care | Payer: 59 | Source: Ambulatory Visit | Attending: Obstetrics and Gynecology | Admitting: Obstetrics and Gynecology

## 2022-12-18 ENCOUNTER — Ambulatory Visit (INDEPENDENT_AMBULATORY_CARE_PROVIDER_SITE_OTHER): Payer: 59 | Admitting: Obstetrics and Gynecology

## 2022-12-18 ENCOUNTER — Encounter: Payer: Self-pay | Admitting: Obstetrics and Gynecology

## 2022-12-18 VITALS — BP 130/84 | HR 90 | Ht 67.0 in | Wt 167.0 lb

## 2022-12-18 DIAGNOSIS — Z01419 Encounter for gynecological examination (general) (routine) without abnormal findings: Secondary | ICD-10-CM | POA: Diagnosis not present

## 2022-12-18 DIAGNOSIS — Z124 Encounter for screening for malignant neoplasm of cervix: Secondary | ICD-10-CM | POA: Diagnosis present

## 2022-12-18 NOTE — Patient Instructions (Signed)

## 2022-12-19 ENCOUNTER — Other Ambulatory Visit: Payer: Self-pay

## 2022-12-19 MED ORDER — CYANOCOBALAMIN 1000 MCG/ML IJ SOLN: INTRAMUSCULAR | 0 refills | Status: DC

## 2022-12-22 LAB — CYTOLOGY - PAP
Comment: NEGATIVE
Diagnosis: NEGATIVE
High risk HPV: NEGATIVE

## 2023-01-15 ENCOUNTER — Other Ambulatory Visit: Payer: Self-pay | Admitting: Physician Assistant

## 2023-02-16 NOTE — Progress Notes (Unsigned)
Cardiology Office Note:    Date:  02/17/2023   ID:  [name], DOB 1961-03-04, MRN [id number]  PCP:  Fredirick Lathe, PA-C  Cardiologist:  Shirlee More, MD    Referring MD: Fredirick Lathe, PA-C    ASSESSMENT:    1. Precordial pain   2. PVC's (premature ventricular contractions)   3. Primary hypertension   4. APC (atrial premature contractions)    PLAN:    In order of problems listed above:  Is a new problem symptoms are not typical angina however I think she should undergo further evaluation with her atrial and ventricular arrhythmia risk factors and abnormal EKG he will be scheduled for cardiac CTA [facility]. Her atrial and ventricular arrhythmias seem to be under good control she intends to purchase the apple smart watch for self-management currently not on beta-blocker Hypertension controlled continue ARB thiazide diuretic   Next appointment: 1 year   Medication Adjustments/Labs and Tests Ordered: Current medicines are reviewed at length with the patient today.  Concerns regarding medicines are outlined above.  Orders Placed This Encounter  Procedures   CT CORONARY MORPH W/CTA COR W/SCORE W/CA W/CM &/OR WO/CM   Basic Metabolic Panel (BMET)   EKG 12-Lead   No orders of the defined types were placed in this encounter.   No chief complaint on file.   History of Present Illness:    Courtney Watson is a 62 y.o. female with a hx of palpitation with symptomatic frequent APCs and brief runs of atrial tachycardia hypertension and right bundle branch block last seen 11/22/2021. She had an event monitor reported 12/08/2021 showing frequent ventricular ectopy with a burden 7.4% couplets 2.8% and 577 triplets as well as occasional supraventricular ectopy. She was referred for EP consultation for frequent ventricular ectopy.  Compliance with diet, lifestyle and medications: Yes  She is here today in follow-up for arrhythmia but is most concerned about  recent chest pain It has been recurrent the last episode was Friday is normal for a few weeks she describes it as diffuse to the left chest sharp stabbing but also is focused underneath her sternum is made him concerned about heart disease It is not associated with physical activity but occurs when she is under stressful situations and lasts for a minute or 2 and resolves spontaneously Her EKG in my office shows sinus rhythm there are 2 PVCs and it is abnormal with right bundle branch block. We discussed further evaluation she will undergo cardiac CT[facility]. 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   Malignant tumor of colon 04/01/2018   Neck pain 09/12/2014   Osteopenia 2018   hips and spine   Palpitations 04/01/2018   Perimenopausal disorder 04/01/2018   Seasonal allergies    Shortness of breath on  exertion 06/08/2017   Ulcerative colitis    Uterine leiomyoma 04/01/2018   Vitamin D deficiency     Past Surgical History:  Procedure Laterality Date   COLON SURGERY  2005   colon cancer--Dr.Rosenbower   ENDOMETRIAL ABLATION  2012   Dr.Lomax   MYOMECTOMY  2012   Dr.Lomax    Current Medications: Current Meds  Medication Sig   ALPRAZolam (XANAX) 0.5 MG tablet Take 1 tablet (0.5 mg total) by mouth 3 (three) times daily as needed.   Biotin 10 MG CAPS Take by mouth.   Calcium Carbonate (CALCIUM 500 PO) Take 1 tablet by  mouth 2 (two) times daily.   Cholecalciferol (VITAMIN D3) 125 MCG (5000 UT) CAPS Take 1 capsule by mouth daily.   cyanocobalamin (VITAMIN B12) 1000 MCG/ML injection INJECT 1 ML INTO THE MUSCLE EVERY 30 DAYS   diclofenac Sodium (VOLTAREN ARTHRITIS PAIN) 1 % GEL Apply 2 g topically 4 (four) times daily.   losartan-hydrochlorothiazide (HYZAAR) 100-12.5 MG tablet TAKE ONE TABLET BY MOUTH DAILY   Multiple Vitamins-Minerals (HAIR/SKIN/NAILS/BIOTIN PO) Take by mouth.   sertraline (ZOLOFT) 100 MG tablet Take 1 tablet (100 mg total) by mouth daily.   SYRINGE-NEEDLE, DISP, 3 ML 23G X 1" 3 ML MISC 1 Device by Does not apply route every 30 (thirty) days.     Allergies:   Patient has no known allergies.   Social History   Socioeconomic History   Marital status: Married    Spouse name: Not on file   Number of children: Not on file   Years of education: Not on file   Highest education level: Not on file  Occupational History   Not on file  Tobacco Use   Smoking status: Former    Types: Cigarettes    Quit date: 11/18/2011    Years since quitting: 11.2    Passive exposure: Never   Smokeless tobacco: Never  Vaping Use   Vaping Use: Every day   Start date: 11/18/2011  Substance and Sexual Activity   Alcohol use: Yes    Alcohol/week: 4.0 standard drinks of alcohol    Types: 4 Cans of beer per week    Comment: on the weekends   Drug use: No   Sexual activity: Yes    Partners: Male    Birth control/protection: Post-menopausal    Comment: Ablation 2012  Other Topics Concern   Not on file  Social History Narrative   Not on file   Social Determinants of Health   Financial Resource Strain: Not on file  Food Insecurity: Not on file  Transportation Needs: Not on file  Physical Activity: Not on file  Stress: Not on file  Social Connections: Not on file     Family History: The patient's family history includes Arthritis in her mother; Breast cancer in her paternal grandmother; Cancer in her  father and paternal grandfather; Cancer (age of onset: 64) in her maternal uncle; Depression in her maternal grandmother; Diabetes in her maternal grandfather; Hearing loss in her mother; Heart disease in her maternal grandfather; Hyperlipidemia in her father; Hypertension in her father, maternal grandfather, and mother; Osteoarthritis in her father; Osteoporosis in her mother; Parkinson's disease in her father; Rheum arthritis in her father. ROS:   Please see the history of present illness.    All other systems reviewed and are negative.  EKGs/Labs/Other Studies Reviewed:    The following studies were reviewed today:  Cardiac Studies & Procedures       ECHOCARDIOGRAM  ECHOCARDIOGRAM  COMPLETE 04/22/2018  Narrative *[facility]* [address] [place], [place] [address] [phone]  ------------------------------------------------------------------- Transthoracic Echocardiography  Patient:    Derenda, Morriss MR #:       [id number] Study Date: 04/22/2018 Gender:     F Age:        55 Height:     172.7 cm Weight:     73.5 kg BSA:        1.89 m^2 Pt. Status: Room:  ATTENDING    Shirlee More, MD ORDERING     Shirlee More, MD [place], MD [place] SONOGRAPHER  Jimmy Reel, RDCS  cc:  ------------------------------------------------------------------- LV EF: 50% -   55%  ------------------------------------------------------------------- Indications:      Palpitations 785.1.  Shortness of breath 786.05.  ------------------------------------------------------------------- History:   PMH:  No prior cardiac history.  ------------------------------------------------------------------- Study Conclusions  - Left ventricle: The cavity size was normal. Wall thickness was normal. Systolic function was normal. The estimated ejection fraction was in the range of 50% to 55%. Wall motion was normal; there were no regional  wall motion abnormalities. Doppler parameters are consistent with abnormal left ventricular relaxation (grade 1 diastolic dysfunction). - Left atrium: The atrium was mildly dilated.  ------------------------------------------------------------------- Study data:  No prior study was available for comparison.  Study status:  Routine.  Procedure:  The patient reported no pain pre or post test. Transthoracic echocardiography. Image quality was adequate.  Study completion:  There were no complications. Transthoracic echocardiography.  M-mode, complete 2D, spectral Doppler, and color Doppler.  Birthdate:  Patient birthdate: 1961-08-16.  Age:  Patient is 62 yr old.  Sex:  Gender: female. BMI: 24.6 kg/m^2.  Blood pressure:     152/86  Patient status: Outpatient.  Study date:  Study date: 04/22/2018. Study time: 08:18 AM.  Location:  Echo laboratory.  -------------------------------------------------------------------  ------------------------------------------------------------------- Left ventricle:  The cavity size was normal. Wall thickness was normal. Systolic function was normal. The estimated ejection fraction was in the range of 50% to 55%. Wall motion was normal; there were no regional wall motion abnormalities. Doppler parameters are consistent with abnormal left ventricular relaxation (grade 1 diastolic dysfunction). There was no evidence of elevated ventricular filling pressure by Doppler parameters.  ------------------------------------------------------------------- Aortic valve:   Structurally normal valve. Trileaflet; normal thickness leaflets. Cusp separation was normal. Mobility was not restricted.  Doppler:  Transvalvular velocity was within the normal range. There was no stenosis. There was no regurgitation.  ------------------------------------------------------------------- Aorta:  Aortic root: The aortic root was normal in  size.  ------------------------------------------------------------------- Mitral valve:   Mildly thickened leaflets . Mobility was not restricted.  Doppler:  Transvalvular velocity was within the normal range. There was no evidence for stenosis. There was trivial regurgitation.    Peak gradient (D): 3 mm Hg.  ------------------------------------------------------------------- Left atrium:  The atrium was mildly dilated.  ------------------------------------------------------------------- Atrial septum:  The septum was normal.  ------------------------------------------------------------------- Pulmonary veins: Common pulmonary vein:  The Doppler velocity and flow profile were normal.  ------------------------------------------------------------------- Right ventricle:  The cavity size was normal. Wall thickness was normal. Systolic function was normal.  ------------------------------------------------------------------- Pulmonic valve:    Doppler:  Transvalvular velocity was within the normal range. There was no evidence for stenosis.  ------------------------------------------------------------------- Tricuspid valve:   Structurally normal valve.    Doppler: Transvalvular velocity was within the normal range. There was trivial regurgitation.  ------------------------------------------------------------------- Pulmonary artery:   The main pulmonary artery was normal-sized. Systolic  pressure was within the normal range.  ------------------------------------------------------------------- Right atrium:  The atrium was normal in size.  ------------------------------------------------------------------- Pericardium:  The pericardium was normal in appearance. There was no pericardial effusion.  ------------------------------------------------------------------- Systemic veins: Inferior vena cava: The vessel was normal in size. The respirophasic diameter changes were in the  normal range (>= 50%), consistent with normal central venous pressure.  ------------------------------------------------------------------- Measurements  Left ventricle                        Value        Reference LV ID, ED, PLAX chordal               46.9  mm     43 - 52 LV ID, ES, PLAX chordal               33.7  mm     23 - 38 LV fx shortening, PLAX chordal (L)    28    %      >=29 LV PW thickness, ED                   9.15  mm     ---------- IVS/LV PW ratio, ED                   1            <=1.3 Stroke volume, 2D                     59    ml     ---------- Stroke volume/bsa, 2D                 31    ml/m^2 ---------- LV e&', lateral                        9.25  cm/s   ---------- LV E/e&', lateral                      8.7          ---------- LV e&', medial                         7.29  cm/s   ---------- LV E/e&', medial                       11.04        ---------- LV e&', average                        8.27  cm/s   ---------- LV E/e&', average                      9.73         ---------- Longitudinal strain, TDI              18    %      ----------  Ventricular septum                    Value        Reference IVS thickness, ED                     9.12  mm     ----------  LVOT  Value        Reference LVOT ID, S                            19    mm     ---------- LVOT area                             2.84  cm^2   ---------- LVOT peak velocity, S                 89.4  cm/s   ---------- LVOT mean velocity, S                 63    cm/s   ---------- LVOT VTI, S                           20.8  cm     ----------  Aorta                                 Value        Reference Aortic root ID, ED                    33    mm     ----------  Left atrium                           Value        Reference LA ID, A-P, ES                        39    mm     ---------- LA ID/bsa, A-P                        2.07  cm/m^2 <=2.2 LA volume, S                           76    ml     ---------- LA volume/bsa, S                      40.3  ml/m^2 ---------- LA volume, ES, 1-p A4C                64.3  ml     ---------- LA volume/bsa, ES, 1-p A4C            34.1  ml/m^2 ---------- LA volume, ES, 1-p A2C                85.9  ml     ---------- LA volume/bsa, ES, 1-p A2C            45.6  ml/m^2 ----------  Mitral valve                          Value        Reference Mitral E-wave peak velocity           80.5  cm/s   ---------- Mitral A-wave peak velocity           93.1  cm/s   ---------- Mitral deceleration time  225   ms     150 - 230 Mitral peak gradient, D               3     mm Hg  ---------- Mitral E/A ratio, peak                0.9          ----------  Pulmonary arteries                    Value        Reference PA pressure, S, DP                    26    mm Hg  <=30  Tricuspid valve                       Value        Reference Tricuspid regurg peak velocity        238   cm/s   ---------- Tricuspid peak RV-RA gradient         23    mm Hg  ----------  Right atrium                          Value        Reference RA ID, S-I, ES, A4C                   48.4  mm     34 - 49 RA area, ES, A4C                      14.7  cm^2   8.3 - 19.5 RA volume, ES, A/L                    35.9  ml     ---------- RA volume/bsa, ES, A/L                19    ml/m^2 ----------  Systemic veins                        Value        Reference Estimated CVP                         3     mm Hg  ----------  Right ventricle                       Value        Reference RV pressure, S, DP                    26    mm Hg  <=30 RV s&', lateral, S                     12.7  cm/s   ----------  Legend: (L)  and  (H)  mark values outside specified reference range.  ------------------------------------------------------------------- Prepared and Electronically Authenticated by  Shirlee More, MD 2019-06-06T12:02:41    MONITORS  LONG TERM MONITOR (3-14 DAYS)  12/08/2021  Narrative Patch Wear Time:  7 days and 2 hours (2023-01-06T15:56:42-0500 to 2023-01-13T18:55:39-0500)  Patient had a min HR of 60 bpm, max HR of 176 bpm, and avg HR of 88 bpm. Predominant underlying  rhythm was Sinus Rhythm. Bundle Branch Block/IVCD was present. 20 Supraventricular Tachycardia runs occurred, the run with the fastest interval lasting 4 beats with a max rate of 176 bpm, the longest lasting 19 beats with an avg rate of 147 bpm. Isolated SVEs were occasional (3.7%, 33140), SVE Couplets were rare (<1.0%, 171), and SVE Triplets were rare (<1.0%, 67). Isolated VEs were occasional (4.6%, 41862), VE Couplets were occasional (2.8%, 12800), and VE Triplets were rare (<1.0%, 577). Ventricular Bigeminy and Trigeminy were present.  The diary and triggered events are predominantly frequent PVCs couplets bigeminy but also at times with occasional atrial premature contractions.  Frequent ventricular ectopy is seen with a burden of 7.4%, couplets were 2.8% and her 577 triplets noted single morphology. Supraventricular ectopy is occasional 3.7% with 20 runs of atrial premature contractions longest 19 complexes rate 147, paroxysmal atrial tachycardia.  There are no episodes of atrial fibrillation or flutter.           EKG:  EKG ordered today and personally reviewed.  The ekg ordered today demonstrates sinus rhythm right bundle branch block two PVCs QT interval normal  Recent Labs: 09/12/2022: TSH 1.590 11/11/2022: ALT 23; BUN 13; Creatinine, Ser 0.58; Hemoglobin 13.4; Platelets 235.0; Potassium 4.0; Sodium 137  Recent Lipid Panel    Component Value Date/Time   CHOL 232 (H) 11/11/2022 0903   CHOL 240 (H) 07/20/2019 1624   TRIG 132.0 11/11/2022 0903   HDL 96.70 11/11/2022 0903   HDL 123 07/20/2019 1624   CHOLHDL 2 11/11/2022 0903   VLDL 26.4 11/11/2022 0903   LDLCALC 109 (H) 11/11/2022 0903   LDLCALC 96 07/20/2019 1624    Physical Exam:    VS:  BP (!) 144/86 (BP Location:  Right Arm, Patient Position: Sitting)   Pulse 81   Ht 5' 7.5" (1.715 m)   Wt 169 lb 3.2 oz (76.7 kg)   SpO2 98%   BMI 26.11 kg/m     Wt Readings from Last 3 Encounters:  02/17/23 169 lb 3.2 oz (76.7 kg)  12/18/22 167 lb (75.8 kg)  11/07/22 168 lb 9.6 oz (76.5 kg)     GEN:  Well nourished, well developed in no acute distress she has no chest wall tenderness HEENT: Normal NECK: No JVD; No carotid bruits LYMPHATICS: No lymphadenopathy CARDIAC: RRR, no murmurs, rubs, gallops RESPIRATORY:  Clear to auscultation without rales, wheezing or rhonchi  ABDOMEN: Soft, non-tender, non-distended MUSCULOSKELETAL:  No edema; No deformity  SKIN: Warm and dry NEUROLOGIC:  Alert and oriented x 3 PSYCHIATRIC:  Normal affect    Signed, Shirlee More, MD  02/17/2023 4:09 PM    [facility]

## 2023-02-17 ENCOUNTER — Encounter: Payer: Self-pay | Admitting: Cardiology

## 2023-02-17 ENCOUNTER — Ambulatory Visit: Payer: 59 | Attending: Cardiology | Admitting: Cardiology

## 2023-02-17 VITALS — BP 144/86 | HR 81 | Ht 67.5 in | Wt 169.2 lb

## 2023-02-17 DIAGNOSIS — R072 Precordial pain: Secondary | ICD-10-CM | POA: Diagnosis not present

## 2023-02-17 DIAGNOSIS — I493 Ventricular premature depolarization: Secondary | ICD-10-CM | POA: Diagnosis not present

## 2023-02-17 DIAGNOSIS — I10 Essential (primary) hypertension: Secondary | ICD-10-CM

## 2023-02-17 DIAGNOSIS — I491 Atrial premature depolarization: Secondary | ICD-10-CM | POA: Diagnosis not present

## 2023-02-17 MED ORDER — METOPROLOL TARTRATE 100 MG PO TABS: 100.0000 mg | ORAL_TABLET | Freq: Once | ORAL | 0 refills | Status: DC

## 2023-02-17 NOTE — Patient Instructions (Signed)
Medication Instructions:  Your physician recommends that you continue on your current medications as directed. Please refer to the Current Medication list given to you today.  *If you need a refill on your cardiac medications before your next appointment, please call your pharmacy*   Lab Work: Your physician recommends that you return for lab work in:   Labs 1 week before CT: BMP  If you have labs (blood work) drawn today and your tests are completely normal, you will receive your results only by: [place] (if you have MyChart) OR A paper copy in the mail If you have any lab test that is abnormal or we need to change your treatment, we will call you to review the results.   Testing/Procedures:   Your cardiac CT will be scheduled at one of the below locations:   [facility] [address] [place], [place] [address] (336) [place] [address] [place], [place] [address] [phone]  [facility] [place], [place] [address] [phone]  If scheduled at [facility], please arrive at the [facility] and Children's Entrance (Entrance C2) of [facility] 30 minutes prior to test start time. You can use the FREE valet parking offered at entrance C (encouraged to control the heart rate for the test)  Proceed to the [facility] Radiology Department (first floor) to check-in and test prep.  All radiology patients and guests should use entrance C2 at [facility], accessed from [facility], even though the hospital's physical address listed is [address]    If scheduled at [facility] or [facility], please arrive 15 mins early for check-in and test prep.   Please follow these instructions carefully (unless otherwise directed):  On the Night Before the Test: Be  sure to Drink plenty of water. Do not consume any caffeinated/decaffeinated beverages or chocolate 12 hours prior to your test. Do not take any antihistamines 12 hours prior to your test.  On the Day of the Test: Drink plenty of water until 1 hour prior to the test. Do not eat any food 1 hour prior to test. You may take your regular medications prior to the test.  Take metoprolol (Lopressor) two hours prior to test. Please HOLD Hyzaar on the morning of the test. FEMALES- please wear underwire-free bra if available, avoid dresses & tight clothing  After the Test: Drink plenty of water. After receiving IV contrast, you may experience a mild flushed feeling. This is normal. On occasion, you may experience a mild rash up to 24 hours after the test. This is not dangerous. If this occurs, you can take Benadryl 25 mg and increase your fluid intake. If you experience trouble breathing, this can be serious. If it is severe call 911 IMMEDIATELY. If it is mild, please call our office. If you take any of these medications: Glipizide/Metformin, Avandament, Glucavance, please do not take 48 hours after completing test unless otherwise instructed.  We will call to schedule your test 2-4 weeks out understanding that some insurance companies will need an authorization prior to the service being performed.   For non-scheduling related questions, please contact the cardiac imaging nurse navigator should you have any questions/concerns: Marchia Bond, Cardiac Imaging Nurse Navigator Gordy Clement, Cardiac Imaging Nurse Navigator [facility] Direct Office Dial: [phone]   For scheduling needs,  including cancellations and rescheduling, please call [place], [phone].    Follow-Up: At [facility], you and your health needs are our priority.  As part of our continuing mission to provide you with exceptional heart care, we have created designated Provider Care  Teams.  These Care Teams include your primary Cardiologist (physician) and Advanced Practice Providers (APPs -  Physician Assistants and Nurse Practitioners) who all work together to provide you with the care you need, when you need it.  We recommend signing up for the patient portal called "MyChart".  Sign up information is provided on this After Visit Summary.  MyChart is used to connect with patients for Virtual Visits (Telemedicine).  Patients are able to view lab/test results, encounter notes, upcoming appointments, etc.  Non-urgent messages can be sent to your provider as well.   To learn more about what you can do with MyChart, go to [email or web address].    Your next appointment:   1 year(s)  Provider:   Shirlee More, MD    Other Instructions None

## 2023-02-20 LAB — BASIC METABOLIC PANEL WITH GFR
BUN/Creatinine Ratio: 20 (ref 9–23)
BUN: 16 mg/dL (ref 6–24)
CO2: 22 mmol/L (ref 20–29)
Calcium: 10 mg/dL (ref 8.7–10.2)
Chloride: 104 mmol/L (ref 96–106)
Creatinine, Ser: 0.79 mg/dL (ref 0.57–1.00)
Glucose: 90 mg/dL (ref 70–99)
Potassium: 4.6 mmol/L (ref 3.5–5.2)
Sodium: 142 mmol/L (ref 134–144)
eGFR: 86 mL/min/1.73

## 2023-02-23 ENCOUNTER — Telehealth: Payer: Self-pay

## 2023-02-23 ENCOUNTER — Telehealth: Payer: Self-pay | Admitting: Cardiology

## 2023-02-23 NOTE — Telephone Encounter (Signed)
Patient informed of results.  

## 2023-02-23 NOTE — Telephone Encounter (Signed)
Called patient and informed her of her test results. She also had a concern regarding the out of pocket cost of her coronary CT scan. She stated that the out of pocket cost for her CT scan was $2000 at [facility]. She also stated that she had found a place called [facility] where the out of pocket cost would be $500 and she asked if that was true. I advised her to contact her insurance company to verify whether they would cover her having the test done at [facility]. Patient stated that she would call them and verify that they would cover her to have the test done at the [facility] and she would contact us to let us know if she wanted the test done there. Patient had no further questions at this time.

## 2023-02-23 NOTE — Telephone Encounter (Signed)
Patient is returning call in regards to results. Requesting return call.  

## 2023-02-25 ENCOUNTER — Telehealth (HOSPITAL_COMMUNITY): Payer: Self-pay | Admitting: *Deleted

## 2023-02-25 NOTE — Telephone Encounter (Signed)
Attempted to call patient regarding upcoming cardiac CT appointment. °Left message on voicemail with name and callback number ° °[name] RN Navigator Cardiac Imaging °[place] [facility] °[phone] Office °[phone] Cell ° °

## 2023-02-26 ENCOUNTER — Ambulatory Visit (HOSPITAL_COMMUNITY)
Admission: RE | Admit: 2023-02-26 | Discharge: 2023-02-26 | Disposition: A | Discharge status: Home / Self Care | Payer: 59 | Source: Ambulatory Visit | Attending: Cardiology | Admitting: Cardiology

## 2023-02-26 ENCOUNTER — Telehealth: Payer: Self-pay | Admitting: Cardiology

## 2023-02-26 DIAGNOSIS — R072 Precordial pain: Secondary | ICD-10-CM | POA: Diagnosis present

## 2023-02-26 MED ORDER — IOHEXOL 350 MG/ML SOLN
100.0000 mL | Freq: Once | INTRAVENOUS | Status: AC | PRN
  Administered 2023-02-26: 100 mL via INTRAVENOUS

## 2023-02-26 MED ORDER — NITROGLYCERIN 0.4 MG SL SUBL
0.8000 mg | SUBLINGUAL_TABLET | Freq: Once | SUBLINGUAL | Status: AC
  Administered 2023-02-26: 0.8 mg via SUBLINGUAL

## 2023-02-26 MED ORDER — NITROGLYCERIN 0.4 MG SL SUBL
SUBLINGUAL_TABLET | SUBLINGUAL | Status: AC
  Filled 2023-02-26: qty 2

## 2023-02-26 NOTE — Telephone Encounter (Signed)
Patient is returning RN's call for CT results. Please advise.  

## 2023-02-26 NOTE — Telephone Encounter (Signed)
Patient informed of results.  

## 2023-03-02 NOTE — Telephone Encounter (Signed)
Patient informed of results.  

## 2023-03-02 NOTE — Telephone Encounter (Signed)
Pt returning call

## 2023-03-05 ENCOUNTER — Other Ambulatory Visit: Payer: Self-pay | Admitting: Physician Assistant

## 2023-03-16 ENCOUNTER — Encounter: Payer: Self-pay | Admitting: Physician Assistant

## 2023-03-17 NOTE — Telephone Encounter (Signed)
Please see pt response.

## 2023-03-17 NOTE — Telephone Encounter (Signed)
Please see pt message and advise next steps.

## 2023-04-02 ENCOUNTER — Other Ambulatory Visit: Payer: Self-pay | Admitting: Physician Assistant

## 2023-04-15 ENCOUNTER — Emergency Department (HOSPITAL_BASED_OUTPATIENT_CLINIC_OR_DEPARTMENT_OTHER)
Admission: EM | Admit: 2023-04-15 | Discharge: 2023-04-15 | Disposition: A | Discharge status: Home / Self Care | Payer: 59 | Attending: Emergency Medicine | Admitting: Emergency Medicine

## 2023-04-15 ENCOUNTER — Emergency Department (HOSPITAL_BASED_OUTPATIENT_CLINIC_OR_DEPARTMENT_OTHER): Payer: 59

## 2023-04-15 ENCOUNTER — Ambulatory Visit: Payer: 59 | Admitting: Family Medicine

## 2023-04-15 ENCOUNTER — Encounter (HOSPITAL_BASED_OUTPATIENT_CLINIC_OR_DEPARTMENT_OTHER): Payer: Self-pay | Admitting: Radiology

## 2023-04-15 ENCOUNTER — Other Ambulatory Visit (HOSPITAL_BASED_OUTPATIENT_CLINIC_OR_DEPARTMENT_OTHER): Payer: Self-pay

## 2023-04-15 ENCOUNTER — Encounter: Payer: Self-pay | Admitting: Family Medicine

## 2023-04-15 ENCOUNTER — Other Ambulatory Visit: Payer: Self-pay

## 2023-04-15 VITALS — BP 130/76 | HR 98 | Temp 98.4°F | Ht 67.5 in | Wt 166.4 lb

## 2023-04-15 DIAGNOSIS — R1032 Left lower quadrant pain: Secondary | ICD-10-CM | POA: Insufficient documentation

## 2023-04-15 DIAGNOSIS — Z1152 Encounter for screening for COVID-19: Secondary | ICD-10-CM | POA: Diagnosis not present

## 2023-04-15 DIAGNOSIS — R1031 Right lower quadrant pain: Secondary | ICD-10-CM | POA: Diagnosis present

## 2023-04-15 DIAGNOSIS — Z87891 Personal history of nicotine dependence: Secondary | ICD-10-CM | POA: Diagnosis not present

## 2023-04-15 DIAGNOSIS — Z9049 Acquired absence of other specified parts of digestive tract: Secondary | ICD-10-CM

## 2023-04-15 DIAGNOSIS — Z85038 Personal history of other malignant neoplasm of large intestine: Secondary | ICD-10-CM | POA: Insufficient documentation

## 2023-04-15 DIAGNOSIS — I10 Essential (primary) hypertension: Secondary | ICD-10-CM | POA: Diagnosis not present

## 2023-04-15 DIAGNOSIS — Z79899 Other long term (current) drug therapy: Secondary | ICD-10-CM | POA: Insufficient documentation

## 2023-04-15 DIAGNOSIS — R102 Pelvic and perineal pain: Secondary | ICD-10-CM

## 2023-04-15 LAB — CBC
HCT: 35.6 % — ABNORMAL LOW (ref 36.0–46.0)
Hemoglobin: 11.4 g/dL — ABNORMAL LOW (ref 12.0–15.0)
MCH: 29.1 pg (ref 26.0–34.0)
MCHC: 32 g/dL (ref 30.0–36.0)
MCV: 90.8 fL (ref 80.0–100.0)
Platelets: 273 10*3/uL (ref 150–400)
RBC: 3.92 MIL/uL (ref 3.87–5.11)
RDW: 14.5 % (ref 11.5–15.5)
WBC: 12.3 10*3/uL — ABNORMAL HIGH (ref 4.0–10.5)
nRBC: 0 % (ref 0.0–0.2)

## 2023-04-15 LAB — URINALYSIS, ROUTINE W REFLEX MICROSCOPIC
Bilirubin Urine: NEGATIVE
Glucose, UA: NEGATIVE mg/dL
Hgb urine dipstick: NEGATIVE
Ketones, ur: NEGATIVE mg/dL
Leukocytes,Ua: NEGATIVE
Nitrite: NEGATIVE
Protein, ur: NEGATIVE mg/dL
Specific Gravity, Urine: 1.012 (ref 1.005–1.030)
pH: 6.5 (ref 5.0–8.0)

## 2023-04-15 LAB — COMPREHENSIVE METABOLIC PANEL
ALT: 14 U/L (ref 0–44)
AST: 19 U/L (ref 15–41)
Albumin: 4.4 g/dL (ref 3.5–5.0)
Alkaline Phosphatase: 87 U/L (ref 38–126)
Anion gap: 13 (ref 5–15)
BUN: 10 mg/dL (ref 6–20)
CO2: 23 mmol/L (ref 22–32)
Calcium: 9.5 mg/dL (ref 8.9–10.3)
Chloride: 100 mmol/L (ref 98–111)
Creatinine, Ser: 0.52 mg/dL (ref 0.44–1.00)
GFR, Estimated: >60 mL/min (ref >60)
Glucose, Bld: 103 mg/dL — ABNORMAL HIGH (ref 70–99)
Potassium: 3.5 mmol/L (ref 3.5–5.1)
Sodium: 136 mmol/L (ref 135–145)
Total Bilirubin: 0.5 mg/dL (ref 0.3–1.2)
Total Protein: 7.3 g/dL (ref 6.5–8.1)

## 2023-04-15 LAB — SARS CORONAVIRUS 2 BY RT PCR: SARS Coronavirus 2 by RT PCR: NEGATIVE

## 2023-04-15 LAB — LIPASE, BLOOD: Lipase: 35 U/L (ref 11–51)

## 2023-04-15 LAB — LACTIC ACID, PLASMA: Lactic Acid, Venous: 0.8 mmol/L (ref 0.5–1.9)

## 2023-04-15 MED ORDER — DICYCLOMINE HCL 10 MG PO CAPS
10.0000 mg | ORAL_CAPSULE | Freq: Once | ORAL | Status: AC
  Administered 2023-04-15: 10 mg via ORAL
  Filled 2023-04-15: qty 1

## 2023-04-15 MED ORDER — HYDROCODONE-ACETAMINOPHEN 5-325 MG PO TABS: 2.0000 | ORAL_TABLET | ORAL | 0 refills | Status: DC | PRN

## 2023-04-15 MED ORDER — DICYCLOMINE HCL 20 MG PO TABS: 20.0000 mg | ORAL_TABLET | Freq: Two times a day (BID) | ORAL | 0 refills | Status: DC

## 2023-04-15 MED ORDER — FENTANYL CITRATE PF 50 MCG/ML IJ SOSY
50.0000 ug | PREFILLED_SYRINGE | Freq: Once | INTRAMUSCULAR | Status: AC
  Administered 2023-04-15: 50 ug via INTRAVENOUS
  Filled 2023-04-15: qty 1

## 2023-04-15 MED ORDER — IOHEXOL 300 MG/ML  SOLN
100.0000 mL | Freq: Once | INTRAMUSCULAR | Status: AC | PRN
  Administered 2023-04-15: 85 mL via INTRAVENOUS

## 2023-04-15 MED ORDER — SODIUM CHLORIDE 0.9 % IV BOLUS
1000.0000 mL | Freq: Once | INTRAVENOUS | Status: AC
  Administered 2023-04-15: 1000 mL via INTRAVENOUS

## 2023-04-15 MED ORDER — ONDANSETRON HCL 4 MG/2ML IJ SOLN
4.0000 mg | Freq: Once | INTRAMUSCULAR | Status: AC
  Administered 2023-04-15: 4 mg via INTRAVENOUS
  Filled 2023-04-15: qty 2

## 2023-04-15 MED ORDER — MORPHINE SULFATE (PF) 4 MG/ML IV SOLN
4.0000 mg | Freq: Once | INTRAVENOUS | Status: AC
  Administered 2023-04-15: 4 mg via INTRAVENOUS
  Filled 2023-04-15: qty 1

## 2023-04-15 MED ORDER — HYDROMORPHONE HCL 1 MG/ML IJ SOLN
1.0000 mg | Freq: Once | INTRAMUSCULAR | Status: AC
  Administered 2023-04-15: 1 mg via INTRAVENOUS
  Filled 2023-04-15: qty 1

## 2023-04-15 MED ORDER — GLYCERIN (LAXATIVE) 2 G RE SUPP: 1.0000 | Freq: Once | RECTAL | Status: DC

## 2023-04-15 NOTE — ED Triage Notes (Signed)
Arrives POV to ED. A+Ox4. Guarding LRQ.   Reports abd pain since 5/26. LRQ radiating into right flank. No BM since 5/25- typically every day. No urinary changes noted. Decreased appetite, +N/-V, tolerates fluids well.  HX bowel resection 12" 2005 for CA.

## 2023-04-15 NOTE — ED Provider Notes (Signed)
[place] EMERGENCY DEPARTMENT AT [facility] Provider Note  CSN: [id number] Arrival date & time: 04/15/23 1316  Chief Complaint(s) Abdominal Pain  HPI Courtney Watson is a 62 y.o. female past medical history of colon cancer status postresection, ulcerative colitis, presenting to the emergency department with right lower quadrant abdominal pain.  She reports it began around 4 days ago.  Has not had a bowel movement for the past 5 days.  Reports associated nausea.  No vomiting.  Denies any fevers, reports some chills.  No urinary symptoms.  Denies similar episode in the past.  Reports decreased appetite.   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   Malignant tumor of colon (HCC) 04/01/2018   Neck pain 09/12/2014   Osteopenia 2018   hips and spine   Palpitations 04/01/2018   Perimenopausal disorder 04/01/2018   Seasonal allergies    Shortness of breath on exertion 06/08/2017   Ulcerative colitis (HCC)    Uterine leiomyoma 04/01/2018   Vitamin D deficiency    Patient Active Problem List   Diagnosis Date Noted   Encounter for annual physical exam 11/07/2022   Arthritis pain of hand 11/07/2022   High cholesterol 02/19/2022   Essential hypertension 11/21/2021   Seasonal allergies    History of chicken pox    H/O rhinoplasty    Fibroid    Eating disorder    Colon polyps    Blood in stool     Abnormal uterine bleeding    B12 deficiency 04/09/2020   Left thyroid nodule    Upper airway cough syndrome 04/07/2019   Malignant tumor of colon (HCC) 04/01/2018   Perimenopausal disorder 04/01/2018   Uterine leiomyoma 04/01/2018   Palpitations 04/01/2018   Irregular heart beat 04/01/2018   Ulcerative colitis (HCC) 01/25/2018   Vitamin D deficiency 01/25/2018   Fatigue 01/25/2018   Shortness of breath on exertion 06/08/2017   Knee pain, right 08/22/2015   Cervical radiculopathy 01/10/2015   Anxiety 09/12/2014   Osteopenia 09/12/2014   Neck pain 09/12/2014   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 Authorizing Provider  dicyclomine (BENTYL) 20 MG tablet Take 1 tablet (20 mg total) by mouth 2 (two) times daily. 04/15/23  Yes Ernie Avena, MD  HYDROcodone-acetaminophen (NORCO/VICODIN) 5-325 MG tablet Take 2 tablets by mouth every 4 (four) hours as needed. 04/15/23  Yes Ernie Avena, MD  ALPRAZolam Prudy Feeler) 0.5 MG tablet Take 1 tablet (0.5 mg total) by mouth 3 (three) times daily as needed. 08/11/22   Cottle, Steva Ready., MD  Biotin 10 MG CAPS Take by mouth.    [provider]  Calcium Carbonate (CALCIUM 500 PO) Take 1 tablet by mouth 2 (two) times daily.    [provider]  Cholecalciferol (VITAMIN D3) 125 MCG (5000 UT) CAPS Take 1 capsule by mouth daily.    [provider]  cyanocobalamin (VITAMIN B12) 1000 MCG/ML injection INJECT INTRAMUSCULARLY EVERY 30 DAYS 04/02/23   Allwardt, Alyssa M, PA-C  diclofenac Sodium (VOLTAREN ARTHRITIS PAIN) 1 % GEL Apply 2 g topically 4 (four) times daily. 11/07/22   Allwardt, Crist Infante, PA-C  losartan-hydrochlorothiazide (HYZAAR) 100-12.5 MG tablet TAKE 1 TABLET BY MOUTH DAILY 03/05/23   Allwardt, Alyssa M, PA-C  metoprolol tartrate (LOPRESSOR)  100 MG tablet Take 1 tablet (100 mg total) by mouth once for 1 dose. Please take this medication 2 hours before CT. 02/17/23 02/17/23  Baldo Daub, MD  Multiple Vitamins-Minerals (HAIR/SKIN/NAILS/BIOTIN PO) Take by mouth.    [provider]  sertraline (ZOLOFT) 100 MG tablet Take 1 tablet (100 mg total) by mouth daily. 08/11/22   Cottle, Steva Ready., MD  SYRINGE-NEEDLE, DISP, 3 ML 23G X 1" 3 ML MISC 1 Device by Does not apply route every 30 (thirty) days. 07/09/20   Orland Mustard, MD                                                                                                                                    Past Surgical History Past Surgical History:  Procedure Laterality Date   COLON SURGERY  2005   colon cancer--Dr.Rosenbower   ENDOMETRIAL ABLATION  2012   Dr.Lomax   MYOMECTOMY  2012   Dr.Lomax   Family History Family History  Problem Relation Age of Onset   Osteoporosis Mother    Hypertension Mother    Arthritis Mother    Hearing loss Mother    Cancer Father        prostate   Parkinson's disease Father        Dec age 68 from parkinsons/Lewey Body Dementia?   Hypertension Father    Hyperlipidemia Father    Osteoarthritis Father    Rheum arthritis Father    Cancer Maternal Uncle 48       colon cancer   Depression Maternal Grandmother    Diabetes Maternal Grandfather    Heart disease Maternal Grandfather    Hypertension Maternal Grandfather    Breast cancer Paternal Grandmother    Cancer Paternal Grandfather     Social History Social History   Tobacco Use   Smoking status: Former    Types: Cigarettes    Quit date: 11/18/2011    Years since quitting: 11.4    Passive exposure: Never   Smokeless tobacco: Never  Vaping Use   Vaping Use: Every day   Start date: 11/18/2011  Substance Use Topics   Alcohol use: Yes    Alcohol/week: 4.0 standard drinks of alcohol    Types: 4 Cans of beer per week  Comment: on the weekends   Drug use: No    Allergies Patient has no known allergies.  Review of Systems Review of Systems  Physical Exam Vital Signs  I have reviewed the triage vital signs BP 127/72 (BP Location: Left Arm)   Pulse 86   Temp 99.9 F (37.7 C) (Oral)   Resp (!) 21   Ht 5' 7.5" (1.715 m)   Wt 75.5 kg   SpO2 96%   BMI 25.68 kg/m  Physical Exam Vitals and nursing note reviewed.  Constitutional:      General: She is not in acute distress.    Appearance: She is well-developed.  HENT:     Head: Normocephalic and atraumatic.     Mouth/Throat:     Mouth: Mucous membranes are moist.  Eyes:     Pupils: Pupils are equal, round, and reactive to light.  Cardiovascular:     Rate and Rhythm: Normal rate and regular rhythm.     Heart sounds: No murmur heard. Pulmonary:     Effort: Pulmonary effort is normal. No respiratory distress.     Breath sounds: Normal breath sounds.  Abdominal:     General: Abdomen is flat.     Palpations: Abdomen is soft.     Tenderness: There is abdominal tenderness in the right lower quadrant and left lower quadrant. Positive signs include Rovsing's sign.  Musculoskeletal:        General: No tenderness.     Right lower leg: No edema.     Left lower leg: No edema.  Skin:    General: Skin is warm and dry.  Neurological:     General: No focal deficit present.     Mental Status: She is alert. Mental status is at baseline.  Psychiatric:        Mood and Affect: Mood normal.        Behavior: Behavior normal.     ED Results and Treatments Labs (all labs ordered are listed, but only abnormal results are displayed) Labs Reviewed  COMPREHENSIVE METABOLIC PANEL - Abnormal; Notable for the following components:      Result Value   Glucose, Bld 103 (*)    All other components within normal limits  CBC - Abnormal; Notable for the following components:   WBC 12.3 (*)    Hemoglobin 11.4 (*)    HCT 35.6 (*)    All other components within normal limits  URINALYSIS, ROUTINE W REFLEX  MICROSCOPIC - Abnormal; Notable for the following components:   Color, Urine COLORLESS (*)    All other components within normal limits  SARS CORONAVIRUS 2 BY RT PCR  LIPASE, BLOOD  LACTIC ACID, PLASMA                                                                                                                          Radiology US PELVIS (TRANSABDOMINAL ONLY)  Result Date: 04/15/2023 CLINICAL DATA:  Pelvic pain for several days, initial encounter EXAM:  TRANSABDOMINAL ULTRASOUND OF PELVIS DOPPLER ULTRASOUND OF OVARIES TECHNIQUE: Transabdominal ultrasound examination of the pelvis was performed including evaluation of the uterus, ovaries, adnexal regions, and pelvic cul-de-sac. Color and duplex Doppler ultrasound was utilized to evaluate blood flow to the ovaries. COMPARISON:  CT from earlier in the same day. FINDINGS: Uterus Measurements: 5.4 x 1.9 x 3.3 cm. = volume: 17.7 mL. No fibroids or other mass visualized. Endometrium Thickness: 3.4 mm.  No focal abnormality visualized. Right ovary Measurements: 2.5 x 1.5 x 3.2 cm. = volume: 6.0 mL. Normal appearance/no adnexal mass. Left ovary Measurements: 3.0 x 1.2 x 2.4 cm. = volume: 4.4 mL. Normal appearance/no adnexal mass. Pulsed Doppler evaluation demonstrates normal low-resistance arterial and venous waveforms in both ovaries. Other: None IMPRESSION: No acute abnormality noted. Electronically Signed   By: Alcide Clever M.D.   On: 04/15/2023 20:20   US PELVIC DOPPLER (TORSION R/O OR MASS ARTERIAL FLOW)  Result Date: 04/15/2023 CLINICAL DATA:  Pelvic pain for several days, initial encounter EXAM: TRANSABDOMINAL ULTRASOUND OF PELVIS DOPPLER ULTRASOUND OF OVARIES TECHNIQUE: Transabdominal ultrasound examination of the pelvis was performed including evaluation of the uterus, ovaries, adnexal regions, and pelvic cul-de-sac. Color and duplex Doppler ultrasound was utilized to evaluate blood flow to the ovaries. COMPARISON:  CT from earlier in the same day.  FINDINGS: Uterus Measurements: 5.4 x 1.9 x 3.3 cm. = volume: 17.7 mL. No fibroids or other mass visualized. Endometrium Thickness: 3.4 mm.  No focal abnormality visualized. Right ovary Measurements: 2.5 x 1.5 x 3.2 cm. = volume: 6.0 mL. Normal appearance/no adnexal mass. Left ovary Measurements: 3.0 x 1.2 x 2.4 cm. = volume: 4.4 mL. Normal appearance/no adnexal mass. Pulsed Doppler evaluation demonstrates normal low-resistance arterial and venous waveforms in both ovaries. Other: None IMPRESSION: No acute abnormality noted. Electronically Signed   By: Alcide Clever M.D.   On: 04/15/2023 20:20   CT ABDOMEN PELVIS W CONTRAST  Result Date: 04/15/2023 CLINICAL DATA:  Right lower quadrant abdominal pain radiating into the right flag for the last 3 days. Some nausea. Previous history of ulcerative colitis with colonic surgery and colon cancer. EXAM: CT ABDOMEN AND PELVIS WITH CONTRAST TECHNIQUE: Multidetector CT imaging of the abdomen and pelvis was performed using the standard protocol following bolus administration of intravenous contrast. RADIATION DOSE REDUCTION: This exam was performed according to the departmental dose-optimization program which includes automated exposure control, adjustment of the mA and/or kV according to patient size and/or use of iterative reconstruction technique. CONTRAST:  85mL OMNIPAQUE IOHEXOL 300 MG/ML  SOLN COMPARISON:  CT abdomen pelvis 10/13/2018 FINDINGS: Lower chest: There is some linear opacity seen along bases likely scar or atelectasis. No pleural effusion. Hepatobiliary: Patent portal vein. Gallbladder is nondilated. There are some tiny low-attenuation foci seen in segment 4 and 2. These are too small to completely characterize but likely benign cystic lesions and are unchanged from previous. No specific imaging follow-up. Pancreas: Unremarkable. No pancreatic ductal dilatation or surrounding inflammatory changes. Spleen: Normal in size without focal abnormality.  Adrenals/Urinary Tract: Adrenal glands are unremarkable. Kidneys are normal, without renal calculi, focal lesion, or hydronephrosis. Bladder is unremarkable. Stomach/Bowel: On this non oral contrast exam, the bowel overall is nondilated. There are some scattered colonic stool. Normal retrocecal appendix. Few fluid-filled loops of bowel in the midabdomen. Vascular/Lymphatic: Aortic atherosclerosis. No enlarged abdominal or pelvic lymph nodes. Reproductive: Uterus and bilateral adnexa are unremarkable. Other: Small fat containing umbilical hernia. No free air or free fluid. Musculoskeletal: Mild degenerative changes seen along the  spine. Mild degenerative changes of the pelvis. IMPRESSION: No bowel obstruction, free air or free fluid. Scattered stool. Normal appendix. Electronically Signed   By: Karen Kays M.D.   On: 04/15/2023 15:33    Pertinent labs & imaging results that were available during my care of the patient were reviewed by me and considered in my medical decision making (see MDM for details).  Medications Ordered in ED Medications  morphine (PF) 4 MG/ML injection 4 mg (4 mg Intravenous Given 04/15/23 1342)  ondansetron (ZOFRAN) injection 4 mg (4 mg Intravenous Given 04/15/23 1342)  sodium chloride 0.9 % bolus 1,000 mL ( Intravenous Stopped 04/15/23 1513)  iohexol (OMNIPAQUE) 300 MG/ML solution 100 mL (85 mLs Intravenous Contrast Given 04/15/23 1451)  fentaNYL (SUBLIMAZE) injection 50 mcg (50 mcg Intravenous Given 04/15/23 1625)  fentaNYL (SUBLIMAZE) injection 50 mcg (50 mcg Intravenous Given 04/15/23 1723)  dicyclomine (BENTYL) capsule 10 mg (10 mg Oral Given 04/15/23 1722)  HYDROmorphone (DILAUDID) injection 1 mg (1 mg Intravenous Given 04/15/23 1843)                                                                                                                                     Procedures Procedures  (including critical care time)  Medical Decision Making / ED  Course   MDM:  62 year old female presenting to the emergency department with right lower quadrant abdominal pain.  Patient well-appearing, physical exam notable for right lower quadrant tenderness.  Differential includes appendicitis, obstruction, volvulus, abscess or infection, nephrolithiasis, pyelonephritis.  Will obtain CT scan laboratory testing for further evaluation of symptoms.  Clinical Course as of 04/16/23 0809  Wed Apr 15, 2023  1530 Signed out to oncoming provider pending CT scan results and re-assessment. [WS]    Clinical Course User Index [WS] Lonell Grandchild, MD     Additional history obtained: -Additional history obtained from spouse -External records from outside source obtained and reviewed including: Chart review including previous notes, labs, imaging, consultation notes including PMD note today   Lab Tests: -I ordered, reviewed, and interpreted labs.   The pertinent results include:   Labs Reviewed  COMPREHENSIVE METABOLIC PANEL - Abnormal; Notable for the following components:      Result Value   Glucose, Bld 103 (*)    All other components within normal limits  CBC - Abnormal; Notable for the following components:   WBC 12.3 (*)    Hemoglobin 11.4 (*)    HCT 35.6 (*)    All other components within normal limits  URINALYSIS, ROUTINE W REFLEX MICROSCOPIC - Abnormal; Notable for the following components:   Color, Urine COLORLESS (*)    All other components within normal limits  SARS CORONAVIRUS 2 BY RT PCR  LIPASE, BLOOD  LACTIC ACID, PLASMA    Notable for mild leukocytosis  EKG   EKG Interpretation  Date/Time:  Wednesday Apr 15 2023 13:30:45 EDT Ventricular Rate:  85  PR Interval:  121 QRS Duration: 144 QT Interval:  399 QTC Calculation: 475 R Axis:   113 Text Interpretation: Sinus rhythm Multiple ventricular premature complexes RBBB Confirmed by Alvino Blood ([id number]) on 04/15/2023 1:44:48 PM         Imaging Studies  ordered: I ordered imaging studies including CT abdomen Results pending at sign out   Medicines ordered and prescription drug management: Meds ordered this encounter  Medications   morphine (PF) 4 MG/ML injection 4 mg   ondansetron (ZOFRAN) injection 4 mg   sodium chloride 0.9 % bolus 1,000 mL   iohexol (OMNIPAQUE) 300 MG/ML solution 100 mL   DISCONTD: Glycerin (Adult) 2 g suppository 1 suppository   fentaNYL (SUBLIMAZE) injection 50 mcg   fentaNYL (SUBLIMAZE) injection 50 mcg   dicyclomine (BENTYL) capsule 10 mg   HYDROmorphone (DILAUDID) injection 1 mg   dicyclomine (BENTYL) 20 MG tablet    Sig: Take 1 tablet (20 mg total) by mouth 2 (two) times daily.    Dispense:  20 tablet    Refill:  0   HYDROcodone-acetaminophen (NORCO/VICODIN) 5-325 MG tablet    Sig: Take 2 tablets by mouth every 4 (four) hours as needed.    Dispense:  10 tablet    Refill:  0    -I have reviewed the patients home medicines and have made adjustments as needed    Cardiac Monitoring: The patient was maintained on a cardiac monitor.  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   Overview:  Clinically Left C4 Clinically Left C4   Cancer (HCC) 2005   colon    Cervical radiculopathy 01/10/2015   Colon polyps    Eating disorder    Fatigue 01/25/2018   Fibroid    H/O rhinoplasty    History of chicken pox    Hypertension    Irregular heart beat 04/01/2018   Knee pain, right 08/22/2015   Left thyroid nodule    needs follow up ultrasound in 2021   Malignant neoplasm of large intestine (HCC)  09/12/2014   Malignant tumor of colon (HCC) 04/01/2018   Neck pain 09/12/2014   Osteopenia 2018   hips and spine   Palpitations 04/01/2018   Perimenopausal disorder 04/01/2018   Seasonal allergies    Shortness of breath on exertion 06/08/2017   Ulcerative colitis (HCC)    Uterine leiomyoma 04/01/2018   Vitamin D deficiency       Dispostion: Disposition decision including need for hospitalization was considered, and patient disposition pending at time of sign out.    Final Clinical Impression(s) / ED Diagnoses Final diagnoses:  RLQ abdominal pain     This chart was dictated using voice recognition software.  Despite best efforts to proofread,  errors can occur which can change the documentation meaning.    Lonell Grandchild, MD 04/16/23 [phone]

## 2023-04-15 NOTE — Progress Notes (Signed)
Subjective  CC:  Chief Complaint  Patient presents with   Abdominal Pain    Pt stated that she has been dealing with some back/abdominal pain since 04/12/2023 and the pain has gotten worse with both. Laying down makes it hurt more and standing in one place eases the pain with both    Back Pain   Same day acute visit; PCP not available. New pt to me. Chart reviewed.   HPI: Courtney Watson is a 62 y.o. female who presents to the office today to address the problems listed above in the chief complaint. Very pleasant 62 year old female with history of colon cancer status post bowel resection in 2005, ulcerative colitis, presents with right lower quadrant abdominal pain.  Symptoms started approximately 5 days ago but worsened significantly over the last 48 hours.  Describes sharp pain in the right lower abdomen, pain with deep breaths, movement or coughing.  Can barely pull herself out of bed due to the pain.  No radicular symptoms.  Pain does radiate around to the back.  No rash.  She reports low-grade fever but no high fever.  Mild nausea no vomiting.  She has not had a bowel movement in 3 days.  Appetite is low.  She is taking IBgard but no other pain medicines.  She does have her appendix.  No urinary symptoms or gross hematuria.  No pelvic pain.  No history of diverticulitis.  No diarrhea.  Assessment  1. Right lower quadrant pain   2. History of colon cancer   3. History of bowel resection      Plan  Right lower quadrant pain: Right lower quadrant pain with peritoneal symptoms and a high risk patient.  Recommend referral directly to emergency room for further evaluation workup.  Differential diagnosis includes acute appendicitis, small bowel obstruction, kidney stone, pelvic infection, diverticulitis or other.  She is stable upon discharge but told to have her husband drive her to the emergency room.  We discussed options of care.  Patient elects workup at [facility] due to  proximity.  Follow up: As needed Visit date not found  No orders of the defined types were placed in this encounter.  No orders of the defined types were placed in this encounter.     I reviewed the patients updated PMH, FH, and SocHx.    Patient Active Problem List   Diagnosis Date Noted   Encounter for annual physical exam 11/07/2022   Arthritis pain of hand 11/07/2022   High cholesterol 02/19/2022   Essential hypertension 11/21/2021   Seasonal allergies    History of chicken pox    H/O rhinoplasty    Fibroid    Eating disorder    Colon polyps    Blood in stool    Abnormal uterine bleeding    B12 deficiency 04/09/2020   Left thyroid nodule    Upper airway cough syndrome 04/07/2019   Malignant tumor of colon (HCC) 04/01/2018   Perimenopausal disorder 04/01/2018   Uterine leiomyoma 04/01/2018   Palpitations 04/01/2018   Irregular heart beat 04/01/2018   Ulcerative colitis (HCC) 01/25/2018   Vitamin D deficiency 01/25/2018   Fatigue 01/25/2018   Shortness of breath on exertion 06/08/2017   Knee pain, right 08/22/2015   Cervical radiculopathy 01/10/2015   Anxiety 09/12/2014   Osteopenia 09/12/2014   Neck pain 09/12/2014   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   Biotin 10 MG CAPS Take by mouth.   Calcium Carbonate (CALCIUM 500 PO) Take 1 tablet by mouth 2 (two) times daily.   Cholecalciferol (VITAMIN D3) 125 MCG (5000 UT) CAPS Take 1 capsule by mouth daily.   cyanocobalamin (VITAMIN B12) 1000 MCG/ML injection INJECT INTRAMUSCULARLY EVERY 30 DAYS   diclofenac Sodium (VOLTAREN ARTHRITIS PAIN) 1 % GEL Apply 2 g topically 4 (four) times daily.   losartan-hydrochlorothiazide (HYZAAR) 100-12.5 MG tablet TAKE 1 TABLET BY MOUTH DAILY   Multiple  Vitamins-Minerals (HAIR/SKIN/NAILS/BIOTIN PO) Take by mouth.   sertraline (ZOLOFT) 100 MG tablet Take 1 tablet (100 mg total) by mouth daily.   SYRINGE-NEEDLE, DISP, 3 ML 23G X 1" 3 ML MISC 1 Device by Does not apply route every 30 (thirty) days.    Allergies: Patient has No Known Allergies. Family History: Patient family history includes Arthritis in her mother; Breast cancer in her paternal grandmother; Cancer in her father and paternal grandfather; Cancer (age of onset: 50) in her maternal uncle; Depression in her maternal grandmother; Diabetes in her maternal grandfather; Hearing loss in her mother; Heart disease in her maternal grandfather; Hyperlipidemia in her father; Hypertension in her father, maternal grandfather, and mother; Osteoarthritis in her father; Osteoporosis in her mother; Parkinson's disease in her father; Rheum arthritis in her father. Social History:  Patient  reports that she quit smoking about 11 years ago. Her smoking use included cigarettes. She has never been exposed to tobacco smoke. She has never used smokeless tobacco. She reports current alcohol use of about 4.0 standard drinks of alcohol per week. She reports that she does not use drugs.  Review of Systems: Constitutional: Negative for fever malaise or anorexia Cardiovascular: negative for chest pain Respiratory: negative for SOB or persistent cough Gastrointestinal: negative for abdominal pain  Objective  Vitals: BP 130/76   Pulse 98   Temp 98.4 F (36.9 C)   Ht 5' 7.5" (1.715 m)   Wt 166 lb 6.4 oz (75.5 kg)   SpO2 98%   BMI 25.68 kg/m  General: Appears uncomfortable, pain with slight movements and deep breathing, A&Ox3, needs assistant to exam table HEENT: PEERL, conjunctiva normal, neck is supple Cardiovascular:  RRR without murmur or gallop.  Respiratory:  Good breath sounds bilaterally, CTAB with normal respiratory effort Abdomen: Hyperactive bowel sounds, right lower quadrant tenderness with  guarding, without rebound, no masses. No suprapubic tenderness, no CVA tenderness, no left lower quadrant tenderness Skin:  Warm, no rashes Negative straight leg raise bilaterally  Commons side effects, risks, benefits, and alternatives for medications and treatment plan prescribed today were discussed, and the patient expressed understanding of the given instructions. Patient is instructed to call or message via MyChart if he/she has any questions or concerns regarding our treatment plan. No barriers to understanding were identified. We discussed Red Flag symptoms and signs in detail. Patient expressed understanding regarding what to do in case of urgent or emergency type symptoms.  Medication list was reconciled, printed and provided to the patient in AVS. Patient instructions and summary information was reviewed with the patient as documented in the AVS. This note was prepared with assistance of Dragon voice recognition software. Occasional wrong-word or sound-a-like substitutions may have occurred due to the inherent limitations of voice recognition software

## 2023-04-15 NOTE — ED Provider Notes (Signed)
  Physical Exam  BP (!) 155/80   Pulse 85   Temp 98.1 F (36.7 C) (Oral)   Resp 16   Ht 5' 7.5" (1.715 m)   Wt 75.5 kg   SpO2 97%   BMI 25.68 kg/m     Procedures  Procedures  ED Course / MDM    Medical Decision Making Amount and/or Complexity of Data Reviewed Labs: ordered. Radiology: ordered.  Risk Prescription drug management.   58F, hx of UC and partial colectomy, presenting with RLQ TTP and no BM for the past few days, CT pending.  CT Abdomen Pelvis:  FINDINGS:  Lower chest: There is some linear opacity seen along bases likely  scar or atelectasis. No pleural effusion.    Hepatobiliary: Patent portal vein. Gallbladder is nondilated. There  are some tiny low-attenuation foci seen in segment 4 and 2. These  are too small to completely characterize but likely benign cystic  lesions and are unchanged from previous. No specific imaging  follow-up.    Pancreas: Unremarkable. No pancreatic ductal dilatation or  surrounding inflammatory changes.    Spleen: Normal in size without focal abnormality.    Adrenals/Urinary Tract: Adrenal glands are unremarkable. Kidneys are  normal, without renal calculi, focal lesion, or hydronephrosis.  Bladder is unremarkable.    Stomach/Bowel: On this non oral contrast exam, the bowel overall is  nondilated. There are some scattered colonic stool. Normal  retrocecal appendix. Few fluid-filled loops of bowel in the  midabdomen.    Vascular/Lymphatic: Aortic atherosclerosis. No enlarged abdominal or  pelvic lymph nodes.    Reproductive: Uterus and bilateral adnexa are unremarkable.    Other: Small fat containing umbilical hernia. No free air or free  fluid.    Musculoskeletal: Mild degenerative changes seen along the spine.  Mild degenerative changes of the pelvis.    IMPRESSION:  No bowel obstruction, free air or free fluid. Scattered stool.  Normal appendix.   The patient was reassessed bedside.  Her CT results were  reviewed with her.  She is still experiencing severe pain to the point where she is having difficulty walking.  Discussed the possibility of ovarian pathology.  No clear abnormality on CT imaging of her adnexa.  Will obtain ultrasound of the ovaries to further assess and administer fentanyl.  [place] Torsion: IMPRESSION:  No acute abnormality noted.   Required multiple rounds of opiates however on repeat assessment was symptomatically improved.  Laboratory evaluation reviewed, lactic acid 0.8, COVID-19 PCR testing negative, lipase normal, CBC with a nonspecific leukocytosis 12.3.  Patient with persistent mild right lower quadrant tenderness on exam, no guarding, no rebound tenderness, no peritonitis.  CT imaging without acute abnormalities.  Discussed with on-call gastroenterology, [facility]ology, Dr. Pati Gallo who do not see an indication for admission or GI inpatient consultation.  I discussed admission for pain control in the setting of intractable pain versus discharge home with continued pain management at home and close outpatient GI follow-up.  After consideration, the patient elected for discharge.  Return precautions provided in the event of any worsening symptoms.  Stable for discharge.      Ernie Avena, MD 04/15/23 2121

## 2023-04-15 NOTE — Discharge Instructions (Signed)
Please follow-up outpatient with gastroenterology.  Your imaging evaluation was overall reassuring.  Return for any severe worsening of your pain.

## 2023-04-16 ENCOUNTER — Encounter: Payer: Self-pay | Admitting: Family Medicine

## 2023-04-16 NOTE — Telephone Encounter (Signed)
Called pt and she is stating she has 5 hydrocodone 5-325 but scared to take em now afraid she is going to run out and need them. Can't physically get up or move off couch. No fever, Last BM Sunday morning; Pain started in lower abdomen and now it has moved primarily to right buttock; pt said if she had to get up to go some where to be seen would have to call an ambulance because she can't move. Mom is currently there taking care of her. Please advise

## 2023-04-16 NOTE — Telephone Encounter (Signed)
Spoke with patient and advised provider recommendations. Pt verbalized understanding and calling EMS to be transported to ED

## 2023-04-17 NOTE — Telephone Encounter (Signed)
Pt states she is feeling some better today. She cannot go back to ED and sit like she did last time for 12+ hours per patient response. Scheduled an appointment in our office for next Thursday, but knows if she gets worse over the weekend she does have UC and ED options. Pt verbalized understanding.

## 2023-04-17 NOTE — Telephone Encounter (Signed)
Caller was Tiffany from [facility]. Caller states patient contacted them to see if they could do an x=ray on her back due to pain. Caller states she informed her they do not handle these kinds of situations. States she was calling to see if pcp could order an x ray for her back so she doesn't have to wait/spend more money in the ED. Please Advise.

## 2023-04-17 NOTE — Telephone Encounter (Signed)
Please see call note, as pt was advised to go to ED yesterday per your recommendations.

## 2023-04-23 ENCOUNTER — Ambulatory Visit (INDEPENDENT_AMBULATORY_CARE_PROVIDER_SITE_OTHER)
Admission: RE | Admit: 2023-04-23 | Discharge: 2023-04-23 | Disposition: A | Discharge status: Home / Self Care | Payer: 59 | Source: Ambulatory Visit | Attending: Physician Assistant | Admitting: Physician Assistant

## 2023-04-23 ENCOUNTER — Other Ambulatory Visit (INDEPENDENT_AMBULATORY_CARE_PROVIDER_SITE_OTHER): Payer: 59

## 2023-04-23 ENCOUNTER — Ambulatory Visit: Payer: 59 | Admitting: Physician Assistant

## 2023-04-23 ENCOUNTER — Other Ambulatory Visit: Payer: Self-pay

## 2023-04-23 VITALS — BP 128/72 | HR 75 | Temp 97.1°F | Ht 67.5 in | Wt 165.8 lb

## 2023-04-23 DIAGNOSIS — M545 Low back pain, unspecified: Secondary | ICD-10-CM | POA: Diagnosis not present

## 2023-04-23 DIAGNOSIS — R7989 Other specified abnormal findings of blood chemistry: Secondary | ICD-10-CM

## 2023-04-23 DIAGNOSIS — R1031 Right lower quadrant pain: Secondary | ICD-10-CM

## 2023-04-23 DIAGNOSIS — Z9049 Acquired absence of other specified parts of digestive tract: Secondary | ICD-10-CM

## 2023-04-23 LAB — CBC WITH DIFFERENTIAL/PLATELET
Basophils Absolute: 0 10*3/uL (ref 0.0–0.1)
Basophils Relative: 0.5 % (ref 0.0–3.0)
Eosinophils Absolute: 0.3 10*3/uL (ref 0.0–0.7)
Eosinophils Relative: 4.1 % (ref 0.0–5.0)
HCT: 35.1 % — ABNORMAL LOW (ref 36.0–46.0)
Hemoglobin: 11.3 g/dL — ABNORMAL LOW (ref 12.0–15.0)
Lymphocytes Relative: 23.6 % (ref 12.0–46.0)
Lymphs Abs: 1.5 10*3/uL (ref 0.7–4.0)
MCHC: 32.2 g/dL (ref 30.0–36.0)
MCV: 89.4 fl (ref 78.0–100.0)
Monocytes Absolute: 0.8 10*3/uL (ref 0.1–1.0)
Monocytes Relative: 13.7 % — ABNORMAL HIGH (ref 3.0–12.0)
Neutro Abs: 3.6 10*3/uL (ref 1.4–7.7)
Neutrophils Relative %: 58.1 % (ref 43.0–77.0)
Platelets: 332 10*3/uL (ref 150.0–400.0)
RBC: 3.93 Mil/uL (ref 3.87–5.11)
RDW: 13.9 % (ref 11.5–15.5)
WBC: 6.2 10*3/uL (ref 4.0–10.5)

## 2023-04-23 NOTE — Progress Notes (Signed)
Subjective:    Patient ID: Courtney Watson, female    DOB: 24-Dec-1960, 62 y.o.   MRN: [id number]  Chief Complaint  Patient presents with   Follow-up    Pt in office for ED follow up; saw Dr Mardelle Matte and thought was having issues with appendix; couldn't walk, couldn't sit or stand; pt feeling much better now but wanting to make sure there is nothing more she needs to do; pt still doesn't know what happened; WBC is up some and RBC down.     HPI Patient is in today for ED f/up from 04/15/23 pelvic and RLQ abd pain. CT abd/pelvis, US pelvis were negative. Slight elevated WBC and low hemoglobin noted on labs. No source of pain found during ED stay. Next 4 days Thur - Sat stayed home, stayed on the couch / in-bed due to severity of pain. Went into her R pelvis, R low back. Couldn't do anything to make pain stop. Husband found old muscle relaxers, which finally seemed to start working and pain gradually eased off. Still feeling some twinges of pain today, but nothing like last week. Wanting to follow-up and make sure we're not missing anything for the cause of her pain.   Appetite is back to normal. Currently on Day 6 or 7 with Miralax, states bowels are not moving regularly again. No blood or tarry stools. She did call GI and they reviewed her case, didn't feel like they need to see her at this time.  Denies any injury or different exercises preceding this event.    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   Overview:  Clinically Left C4 Clinically Left C4   Cancer (HCC) 2005   colon    Cervical radiculopathy 01/10/2015   Colon polyps    Eating disorder    Fatigue 01/25/2018   Fibroid    H/O rhinoplasty    History of chicken pox    Hypertension    Irregular heart beat 04/01/2018   Knee pain, right 08/22/2015   Left thyroid nodule    needs follow up ultrasound  in 2021   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   Malignant tumor of colon (HCC) 04/01/2018   Neck pain 09/12/2014   Osteopenia 2018   hips and spine   Palpitations 04/01/2018   Perimenopausal disorder 04/01/2018   Seasonal allergies    Shortness of breath on exertion 06/08/2017   Ulcerative colitis (HCC)    Uterine leiomyoma 04/01/2018   Vitamin D deficiency     Past Surgical History:  Procedure Laterality Date   COLON SURGERY  2005   colon cancer--Dr.Rosenbower   ENDOMETRIAL ABLATION  2012   Dr.Lomax   MYOMECTOMY  2012   Dr.Lomax    Family History  Problem Relation Age of Onset   Osteoporosis Mother    Hypertension Mother    Arthritis Mother    Hearing loss Mother    Cancer Father        prostate   Parkinson's disease Father        Dec age 58 from parkinsons/Lewey Body Dementia?   Hypertension Father    Hyperlipidemia Father    Osteoarthritis Father    Rheum arthritis Father    Cancer Maternal Uncle 37       colon cancer   Depression Maternal Grandmother  Diabetes Maternal Grandfather    Heart disease Maternal Grandfather    Hypertension Maternal Grandfather    Breast cancer Paternal Grandmother    Cancer Paternal Grandfather     Social History   Tobacco Use   Smoking status: Former    Types: Cigarettes    Quit date: 11/18/2011    Years since quitting: 11.4    Passive exposure: Never   Smokeless tobacco: Never  Vaping Use   Vaping Use: Every day   Start date: 11/18/2011  Substance Use Topics   Alcohol use: Yes    Alcohol/week: 4.0 standard drinks of alcohol    Types: 4 Cans of beer per week    Comment: on the weekends   Drug use: No     No Known Allergies  Review of Systems NEGATIVE UNLESS OTHERWISE INDICATED IN HPI      Objective:     BP 128/72 (BP Location: Left Arm, Patient Position: Sitting) Comment (Patient Position): manually  Pulse 75   Temp (!) 97.1 F (36.2 C) (Temporal)   Ht 5' 7.5" (1.715 m)   Wt 165 lb  12.8 oz (75.2 kg)   SpO2 99%   BMI 25.58 kg/m   Wt Readings from Last 3 Encounters:  04/23/23 165 lb 12.8 oz (75.2 kg)  04/15/23 166 lb 6.4 oz (75.5 kg)  04/15/23 166 lb 6.4 oz (75.5 kg)    BP Readings from Last 3 Encounters:  04/23/23 128/72  04/15/23 127/72  04/15/23 130/76     Physical Exam Vitals and nursing note reviewed.  Constitutional:      General: She is not in acute distress.    Appearance: Normal appearance. She is not ill-appearing.  HENT:     Head: Normocephalic and atraumatic.  Cardiovascular:     Rate and Rhythm: Normal rate and regular rhythm.     Pulses: Normal pulses.     Heart sounds: Normal heart sounds.  Pulmonary:     Effort: Pulmonary effort is normal.     Breath sounds: Normal breath sounds.  Abdominal:     General: Abdomen is flat. Bowel sounds are normal. There is no distension.     Palpations: Abdomen is soft. There is no mass.     Tenderness: There is no abdominal tenderness. There is no right CVA tenderness, left CVA tenderness, guarding or rebound.     Hernia: No hernia is present.  Musculoskeletal:     Lumbar back: No spasms or tenderness. Normal range of motion. Negative right straight leg raise test and negative left straight leg raise test.  Skin:    General: Skin is warm and dry.     Findings: No rash.  Neurological:     General: No focal deficit present.     Mental Status: She is alert.  Psychiatric:        Mood and Affect: Mood normal.        Assessment & Plan:  Right lower quadrant pain  Lumbar back pain -     DG Lumbar Spine Complete; Future  Abnormal CBC -     CBC with Differential/Platelet; Future   1. Right lower quadrant pain I personally reviewed ED note, labs, imaging. This pain has resolved. Pt to call if any changes.  2. Lumbar back pain Suspect pain may have been MSK / spasm related, although she's never had this happen before. Due to severity of her pain she had, plus hx colon cancer, will check XRAY  lumbar spine and make sure all  OK there.  3. Abnormal CBC Recheck labs today, treat pending any abnormal findings.   Pt agreeable with plan. Will let me know if anything changes.    Return if symptoms worsen or fail to improve.   Ildefonso Keaney M Herley Bernardini, PA-C

## 2023-04-24 ENCOUNTER — Other Ambulatory Visit: Payer: 59

## 2023-04-24 DIAGNOSIS — Z9049 Acquired absence of other specified parts of digestive tract: Secondary | ICD-10-CM

## 2023-04-24 DIAGNOSIS — R7989 Other specified abnormal findings of blood chemistry: Secondary | ICD-10-CM

## 2023-05-04 ENCOUNTER — Other Ambulatory Visit: Payer: Self-pay | Admitting: Physician Assistant

## 2023-05-07 ENCOUNTER — Other Ambulatory Visit: Payer: 59

## 2023-05-07 ENCOUNTER — Ambulatory Visit (INDEPENDENT_AMBULATORY_CARE_PROVIDER_SITE_OTHER): Payer: 59

## 2023-05-07 ENCOUNTER — Other Ambulatory Visit: Payer: Self-pay

## 2023-05-07 DIAGNOSIS — Z9049 Acquired absence of other specified parts of digestive tract: Secondary | ICD-10-CM | POA: Diagnosis not present

## 2023-05-07 DIAGNOSIS — R7989 Other specified abnormal findings of blood chemistry: Secondary | ICD-10-CM

## 2023-05-07 LAB — FECAL OCCULT BLOOD, IMMUNOCHEMICAL: Fecal Occult Bld: NEGATIVE

## 2023-05-08 LAB — IRON,TIBC AND FERRITIN PANEL
%SAT: 14 % (calc) — ABNORMAL LOW (ref 16–45)
Ferritin: 10 ng/mL — ABNORMAL LOW (ref 16–232)
Iron: 57 ug/dL (ref 45–160)
TIBC: 418 mcg/dL (calc) (ref 250–450)

## 2023-05-11 ENCOUNTER — Encounter: Payer: Self-pay | Admitting: Physician Assistant

## 2023-06-18 ENCOUNTER — Other Ambulatory Visit: Payer: Self-pay | Admitting: Physician Assistant

## 2023-07-31 LAB — HM MAMMOGRAPHY

## 2023-08-12 ENCOUNTER — Ambulatory Visit: Payer: 59 | Admitting: Psychiatry

## 2023-08-12 ENCOUNTER — Encounter: Payer: Self-pay | Admitting: Psychiatry

## 2023-08-12 DIAGNOSIS — F410 Panic disorder [episodic paroxysmal anxiety] without agoraphobia: Secondary | ICD-10-CM | POA: Diagnosis not present

## 2023-08-12 DIAGNOSIS — F40233 Fear of injury: Secondary | ICD-10-CM

## 2023-08-12 MED ORDER — SERTRALINE HCL 100 MG PO TABS: 100.0000 mg | ORAL_TABLET | Freq: Every day | ORAL | 3 refills | Status: DC

## 2023-08-12 NOTE — Progress Notes (Signed)
Courtney Watson [id number] 1961-01-14 62 y.o.  Subjective:   Patient ID:  Courtney Watson is a 62 y.o. (DOB 01-Apr-1961) female.  Chief Complaint:  Chief Complaint  Patient presents with   Follow-up   Anxiety    HPI Courtney Watson presents to the office today for follow-up of anxiety.  08/08/20 appt without med changes: Cont sertraline 100 and about 10 Xanax/month.  08/08/21 appt noted: Pretty good.  Around mid May at [facility] had panic attack.  Taking Xanax again when going out after that.  Disappointed at having to take it again.  Anxiety is a little worse in the car again.  Good when she's driving.  Fiddles with fingers when H drives.   No SE. Plan: Continue sertraline 100 and prn Xanax. High relapse risk without it.  08/11/22 appt noted: Fantastic.  Anxiety manageable.  3 panic since here situalionally in a restaurant. Using Xanax prn when goes out to restaurant if needed like it's busy. Consistent with sertraline. No SE with either. Plan no changes  08/12/23 appt noted:  Psych med: sertraline 100 and prn Xanax. Anxiety good and under control.  Xanax much less than in the past.  Will need it if close restaurants crowded.  Does ok without flying now.  Doing fine in the car for long periods.  H notices benefit too.  Rare panic and never spontaneous.   No SE Not dep.   Normal function.    Xanax works if needed. No spontaneous panic attacks lately.  Can trigger herself to feel throat close up if talks.  Review of Systems:  Review of Systems  Cardiovascular:  Negative for chest pain and palpitations.  Neurological:  Negative for tremors and weakness.    Medications: I have reviewed the patient's current medications.  Current Outpatient Medications  Medication Sig Dispense Refill   ALPRAZolam (XANAX) 0.5 MG tablet Take 1 tablet (0.5 mg total) by mouth 3 (three) times daily as needed. 60 tablet 0   Biotin 10 MG CAPS Take by mouth.     Calcium Carbonate (CALCIUM 500 PO) Take 1  tablet by mouth 2 (two) times daily.     Cholecalciferol (VITAMIN D3) 125 MCG (5000 UT) CAPS Take 1 capsule by mouth daily.     cyanocobalamin (VITAMIN B12) 1000 MCG/ML injection INJECT INTO THE MUSCLE EVERY 30 DAYS 1 mL 2   diclofenac Sodium (VOLTAREN ARTHRITIS PAIN) 1 % GEL Apply 2 g topically 4 (four) times daily. 100 g 0   dicyclomine (BENTYL) 20 MG tablet Take 1 tablet (20 mg total) by mouth 2 (two) times daily. 20 tablet 0   losartan-hydrochlorothiazide (HYZAAR) 100-12.5 MG tablet TAKE 1 TABLET BY MOUTH DAILY 90 tablet 1   Multiple Vitamins-Minerals (HAIR/SKIN/NAILS/BIOTIN PO) Take by mouth.     SYRINGE-NEEDLE, DISP, 3 ML 23G X 1" 3 ML MISC 1 Device by Does not apply route every 30 (thirty) days. 50 each 1   metoprolol tartrate (LOPRESSOR) 100 MG tablet Take 1 tablet (100 mg total) by mouth once for 1 dose. Please take this medication 2 hours before CT. 1 tablet 0   sertraline (ZOLOFT) 100 MG tablet Take 1 tablet (100 mg total) by mouth daily. 90 tablet 3   No current facility-administered medications for this visit.    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   Overview:  Clinically Left C4 Clinically Left C4   Cancer (HCC) 2005   colon    Cervical radiculopathy 01/10/2015   Colon polyps    Eating disorder    Fatigue 01/25/2018   Fibroid    H/O rhinoplasty    History of chicken pox    Hypertension    Irregular heart beat 04/01/2018   Knee pain, right 08/22/2015   Left thyroid nodule    needs follow up ultrasound in 2021   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   Hypertension Father    Hyperlipidemia Father    Osteoarthritis Father    Rheum arthritis Father    Cancer Maternal Uncle 36       colon cancer   Depression Maternal Grandmother    Diabetes Maternal Grandfather    Heart disease Maternal Grandfather    Hypertension Maternal Grandfather    Breast cancer Paternal Grandmother    Cancer Paternal Grandfather     Social History   Socioeconomic History   Marital status: Married    Spouse name: Not on file   Number of children: Not on file   Years of education: Not on file   Highest education level: Associate degree: academic program  Occupational History   Not on file  Tobacco Use   Smoking status: Former    Current packs/day: 0.00    Types: Cigarettes    Quit date: 11/18/2011    Years since quitting: 11.7    Passive exposure: Never   Smokeless tobacco: Never  Vaping Use   Vaping status: Every Day   Start date: 11/18/2011  Substance and Sexual Activity   Alcohol use: Yes    Alcohol/week: 4.0 standard drinks of alcohol    Types: 4 Cans of beer per week    Comment: on the weekends   Drug use: No   Sexual activity: Yes    Partners: Male    Birth control/protection: Post-menopausal    Comment: Ablation 2012  Other Topics Concern   Not on file  Social History Narrative   Not on file   Social Determinants of Health   Financial Resource Strain: Low Risk  (04/23/2023)   Overall Financial Resource Strain (CARDIA)    Difficulty of Paying Living Expenses: Not hard at all  Food Insecurity: No Food Insecurity (04/23/2023)   Hunger Vital Sign     Worried About Running Out of Food in the Last Year: Never true    Ran Out of Food in the Last Year: Never true  Transportation Needs: No Transportation Needs (04/23/2023)   PRAPARE - Administrator, Civil Service (Medical): No    Lack of Transportation (Non-Medical): No  Physical Activity: Sufficiently Active (04/23/2023)  Exercise Vital Sign    Days of Exercise per Week: 3 days    Minutes of Exercise per Session: 60 min  Stress: No Stress Concern Present (04/23/2023)   [employer] of Occupational Health - Occupational Stress Questionnaire    Feeling of Stress : Not at all  Social Connections: Unknown (04/23/2023)   Social Connection and Isolation Panel [NHANES]    Frequency of Communication with Friends and Family: Twice a week    Frequency of Social Gatherings with Friends and Family: Once a week    Attends Religious Services: Patient declined    Database administrator or Organizations: No    Attends Engineer, structural: Not on file    Marital Status: Married  Catering manager Violence: Not on file    Past Medical History, Surgical history, Social history, and Family history were reviewed and updated as appropriate.   Please see review of systems for further details on the patient's review from today.   Objective:   Physical Exam:  There were no vitals taken for this visit.  Physical Exam Constitutional:      General: She is not in acute distress.    Appearance: She is well-developed.  Neurological:     Mental Status: She is alert and oriented to person, place, and time.     Cranial Nerves: No dysarthria.  Psychiatric:        Attention and Perception: She is attentive. She does not perceive auditory or visual hallucinations.        Mood and Affect: Mood is anxious. Mood is not depressed. Affect is not labile, blunt, angry or inappropriate.        Speech: Speech normal.        Behavior: Behavior normal.        Thought Content: Thought content normal.  Thought content is not delusional. Thought content does not include homicidal or suicidal ideation. Thought content does not include suicidal plan.        Cognition and Memory: Cognition normal.        Judgment: Judgment normal.     Comments: Insight intact. No auditory or visual hallucinations. No delusions.  She still has some anxiety around eating and requires the Xanax prn eatin in restaurant but it is manageable and much less severe than in the past.  Some anxiety in car also but better     Lab Review:     Component Value Date/Time   NA 136 04/15/2023 1337   NA 142 02/19/2023 1606   K 3.5 04/15/2023 1337   CL 100 04/15/2023 1337   CO2 23 04/15/2023 1337   GLUCOSE 103 (H) 04/15/2023 1337   BUN 10 04/15/2023 1337   BUN 16 02/19/2023 1606   CREATININE 0.52 04/15/2023 1337   CREATININE 0.59 03/18/2017 1506   CALCIUM 9.5 04/15/2023 1337   PROT 7.3 04/15/2023 1337   PROT 7.1 07/20/2019 1624   ALBUMIN 4.4 04/15/2023 1337   ALBUMIN 4.7 07/20/2019 1624   AST 19 04/15/2023 1337   ALT 14 04/15/2023 1337   ALKPHOS 87 04/15/2023 1337   BILITOT 0.5 04/15/2023 1337   BILITOT 0.2 07/20/2019 1624   GFRNONAA >60 04/15/2023 1337   GFRAA 116 11/29/2020 1124       Component Value Date/Time   WBC 6.2 04/23/2023 1154   RBC 3.93 04/23/2023 1154   HGB 11.3 (L) 04/23/2023 1154   HGB 12.5 07/20/2019 1624   HCT 35.1 (L) 04/23/2023 1154   HCT 38.8 07/20/2019 1624  PLT 332.0 04/23/2023 1154   PLT 250 07/20/2019 1624   MCV 89.4 04/23/2023 1154   MCV 93 07/20/2019 1624   MCH 29.1 04/15/2023 1337   MCHC 32.2 04/23/2023 1154   RDW 13.9 04/23/2023 1154   RDW 12.4 07/20/2019 1624   LYMPHSABS 1.5 04/23/2023 1154   MONOABS 0.8 04/23/2023 1154   EOSABS 0.3 04/23/2023 1154   BASOSABS 0.0 04/23/2023 1154    No results found for: "POCLITH", "LITHIUM"   No results found for: "PHENYTOIN", "PHENOBARB", "VALPROATE", "CBMZ"   .res Assessment: Plan:    Panic disorder without agoraphobia -  Plan: sertraline (ZOLOFT) 100 MG tablet  Fear of choking   Marcelino Duster has a history of panic disorder and a fear of choking.  It has been extreme and caused a great deal of avoidance in the past she would avoid eating out.  Now she can eat out although she does have to take half of 0.5 mg Xanax in order to do so.  She gets overall antipanic effects from the sertraline. Continue sertraline 100 and prn Xanax 0.5 mg.  High relapse risk without meds.  She still has some anxiety around eating and requires the Xanax prn eatin in restaurant but it is manageable and much less severe than in the past.  Some anxiety in car also  No med changes.   overall doing well.   She still has some anxiety around eating and requires the Xanax prn eatin in restaurant but it is manageable and much less severe than in the past.  Some anxiety in car also  We discussed the short-term risks associated with benzodiazepines including sedation and increased fall risk among others.  Discussed long-term side effect risk including dependence, potential withdrawal symptoms, and the potential eventual dose-related risk of dementia.  But recent studies from 2020 dispute this association between benzodiazepines and dementia risk. Newer studies in 2020 do not support an association with dementia.  Follow-up 1 year  Courtney Staggers, MD, DFAPA   Please see After Visit Summary for patient specific instructions.  Future Appointments  Date Time Provider Department Center  11/17/2023 10:00 AM Allwardt, Crist Infante, PA-C [facility]    No orders of the defined types were placed in this encounter.     -------------------------------

## 2023-09-13 ENCOUNTER — Other Ambulatory Visit: Payer: Self-pay | Admitting: Physician Assistant

## 2023-10-01 ENCOUNTER — Other Ambulatory Visit: Payer: Self-pay | Admitting: Physician Assistant

## 2023-11-17 ENCOUNTER — Ambulatory Visit (INDEPENDENT_AMBULATORY_CARE_PROVIDER_SITE_OTHER): Payer: 59 | Admitting: Physician Assistant

## 2023-11-17 ENCOUNTER — Encounter: Payer: Self-pay | Admitting: Physician Assistant

## 2023-11-17 VITALS — BP 140/54 | HR 88 | Temp 97.7°F | Ht 67.5 in | Wt 161.6 lb

## 2023-11-17 DIAGNOSIS — Z0000 Encounter for general adult medical examination without abnormal findings: Secondary | ICD-10-CM

## 2023-11-17 DIAGNOSIS — D2261 Melanocytic nevi of right upper limb, including shoulder: Secondary | ICD-10-CM | POA: Insufficient documentation

## 2023-11-17 DIAGNOSIS — L821 Other seborrheic keratosis: Secondary | ICD-10-CM | POA: Insufficient documentation

## 2023-11-17 DIAGNOSIS — E538 Deficiency of other specified B group vitamins: Secondary | ICD-10-CM | POA: Diagnosis not present

## 2023-11-17 DIAGNOSIS — L603 Nail dystrophy: Secondary | ICD-10-CM | POA: Insufficient documentation

## 2023-11-17 DIAGNOSIS — Z13220 Encounter for screening for lipoid disorders: Secondary | ICD-10-CM | POA: Diagnosis not present

## 2023-11-17 DIAGNOSIS — L578 Other skin changes due to chronic exposure to nonionizing radiation: Secondary | ICD-10-CM | POA: Insufficient documentation

## 2023-11-17 DIAGNOSIS — Z131 Encounter for screening for diabetes mellitus: Secondary | ICD-10-CM | POA: Diagnosis not present

## 2023-11-17 DIAGNOSIS — L820 Inflamed seborrheic keratosis: Secondary | ICD-10-CM | POA: Insufficient documentation

## 2023-11-17 LAB — CBC WITH DIFFERENTIAL/PLATELET
Basophils Absolute: 0 10*3/uL (ref 0.0–0.1)
Basophils Relative: 0.4 % (ref 0.0–3.0)
Eosinophils Absolute: 0.2 10*3/uL (ref 0.0–0.7)
Eosinophils Relative: 3.4 % (ref 0.0–5.0)
HCT: 41.3 % (ref 36.0–46.0)
Hemoglobin: 13.6 g/dL (ref 12.0–15.0)
Lymphocytes Relative: 19.5 % (ref 12.0–46.0)
Lymphs Abs: 1.2 10*3/uL (ref 0.7–4.0)
MCHC: 32.9 g/dL (ref 30.0–36.0)
MCV: 96.2 fL (ref 78.0–100.0)
Monocytes Absolute: 0.7 10*3/uL (ref 0.1–1.0)
Monocytes Relative: 11.4 % (ref 3.0–12.0)
Neutro Abs: 4.1 10*3/uL (ref 1.4–7.7)
Neutrophils Relative %: 65.3 % (ref 43.0–77.0)
Platelets: 237 10*3/uL (ref 150.0–400.0)
RBC: 4.3 Mil/uL (ref 3.87–5.11)
RDW: 13.6 % (ref 11.5–15.5)
WBC: 6.3 10*3/uL (ref 4.0–10.5)

## 2023-11-17 LAB — COMPREHENSIVE METABOLIC PANEL
ALT: 20 U/L (ref 0–35)
AST: 26 U/L (ref 0–37)
Albumin: 4.6 g/dL (ref 3.5–5.2)
Alkaline Phosphatase: 86 U/L (ref 39–117)
BUN: 13 mg/dL (ref 6–23)
CO2: 26 meq/L (ref 19–32)
Calcium: 9.5 mg/dL (ref 8.4–10.5)
Chloride: 100 meq/L (ref 96–112)
Creatinine, Ser: 0.57 mg/dL (ref 0.40–1.20)
GFR: 98.7 mL/min (ref >60.00)
Glucose, Bld: 101 mg/dL — ABNORMAL HIGH (ref 70–99)
Potassium: 3.9 meq/L (ref 3.5–5.1)
Sodium: 137 meq/L (ref 135–145)
Total Bilirubin: 0.4 mg/dL (ref 0.2–1.2)
Total Protein: 7.5 g/dL (ref 6.0–8.3)

## 2023-11-17 LAB — LIPID PANEL
Cholesterol: 250 mg/dL — ABNORMAL HIGH (ref 0–200)
HDL: 121.9 mg/dL (ref >39.00)
LDL Cholesterol: 116 mg/dL — ABNORMAL HIGH (ref 0–99)
NonHDL: 127.93
Total CHOL/HDL Ratio: 2
Triglycerides: 61 mg/dL (ref 0.0–149.0)
VLDL: 12.2 mg/dL (ref 0.0–40.0)

## 2023-11-17 LAB — HEMOGLOBIN A1C: Hgb A1c MFr Bld: 5.5 % (ref 4.6–6.5)

## 2023-11-17 LAB — VITAMIN B12: Vitamin B-12: 845 pg/mL (ref 211–911)

## 2023-11-17 LAB — TSH: TSH: 2 u[IU]/mL (ref 0.35–5.50)

## 2023-11-17 NOTE — Progress Notes (Signed)
 Patient ID: Courtney Watson, female    DOB: [birth year], 62 y.o.   MRN: [id number]   Assessment & Plan:  Annual physical exam -     CBC with Differential/Platelet -     Comprehensive metabolic panel -     Hemoglobin A1c -     Lipid panel -     TSH -     Vitamin B12  B12 deficiency -     Vitamin B12   Age-appropriate screening and counseling performed today. Will check labs and call with results. Preventive measures discussed and printed in AVS for patient.   Patient Counseling: [x]   Nutrition: Stressed importance of moderation in sodium/caffeine intake, saturated fat and cholesterol, caloric balance, sufficient intake of fresh fruits, vegetables, and fiber.  [x]   Stressed the importance of regular exercise.   []   Substance Abuse: Discussed cessation/primary prevention of tobacco, alcohol, or other drug use; driving or other dangerous activities under the influence; availability of treatment for abuse.   []   Injury prevention: Discussed safety belts, safety helmets, smoke detector, smoking near bedding or upholstery.   []   Sexuality: Discussed sexually transmitted diseases, partner selection, use of condoms, avoidance of unintended pregnancy  and contraceptive alternatives.   [x]   Dental health: Discussed importance of regular tooth brushing, flossing, and dental visits.  [x]   Health maintenance and immunizations reviewed. Please refer to Health maintenance section.         Return in about 1 year (around 11/16/2024) for physical.    Subjective:    Chief Complaint  Patient presents with   Annual Exam    Pt in office for annual CPE and fasting labs; no concerns to discuss; pt bp is normal when giving blood with [facility] but always runs higher in the office advised will recheck before leaving the office today.    HPI Patient is in today for annual exam. She has been working hard to lose weight, 10 lbs this year total. Walking more and regular exercise. Good eating habits.   Regular follow up with dermatology.  Taking a probiotic and says this has made a good difference, not having any stomach pain.  Regularly takes B12 without any issues.  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   Malignant tumor of colon (HCC) 04/01/2018   Neck pain 09/12/2014   Osteopenia 2018   hips and spine   Palpitations 04/01/2018   Perimenopausal disorder 04/01/2018   Seasonal allergies    Shortness of breath on exertion 06/08/2017   Ulcerative colitis (HCC)    Uterine leiomyoma 04/01/2018   Vitamin D  deficiency     Past Surgical History:  Procedure Laterality Date   COLON SURGERY  2005   colon cancer--Dr.Rosenbower   ENDOMETRIAL ABLATION  2012   Dr.Lomax   MYOMECTOMY  2012   Dr.Lomax    Family History  Problem Relation Age of Onset   Osteoporosis Mother    Hypertension Mother    Arthritis Mother    Hearing loss Mother    Cancer Father        prostate   Parkinson's disease Father        Dec age 87 from parkinsons/Lewey Body Dementia?   Hypertension Father    Hyperlipidemia Father    Osteoarthritis Father    Rheum arthritis Father    Cancer Maternal Uncle 90       colon cancer   Depression Maternal Grandmother    Diabetes Maternal Grandfather    Heart disease Maternal Grandfather    Hypertension Maternal Grandfather    Breast cancer Paternal Grandmother    Cancer Paternal Grandfather      Social History   Tobacco Use   Smoking status: Former    Current packs/day: 0.00    Types: Cigarettes    Quit date: 11/18/2011    Years since quitting: 12.0    Passive exposure: Never   Smokeless tobacco: Never  Vaping Use   Vaping status: Every Day   Start date: 11/18/2011  Substance Use Topics   Alcohol use: Yes    Alcohol/week: 4.0 standard drinks of alcohol    Types: 4 Cans of beer per week    Comment: on the weekends   Drug use: No     No Known Allergies  Review of Systems NEGATIVE UNLESS OTHERWISE INDICATED IN HPI      Objective:     BP (!) 140/54 (BP Location: Left Arm, Patient Position: Sitting)   Pulse 88   Temp 97.7 F (36.5 C) (Temporal)   Ht 5' 7.5 (1.715 m)   Wt 161 lb 9.6 oz (73.3 kg)   SpO2 97%   BMI 24.94 kg/m   Wt Readings from Last 3 Encounters:  11/17/23 161 lb 9.6 oz (73.3 kg)  04/23/23 165 lb 12.8 oz (75.2 kg)  04/15/23 166 lb 6.4 oz (75.5 kg)    BP Readings from Last 3 Encounters:  11/17/23 (!) 140/54  04/23/23 128/72  04/15/23 127/72     Physical Exam Vitals and nursing note reviewed.  Constitutional:      Appearance: Normal appearance. She is normal weight. She is not toxic-appearing.  HENT:     Head: Normocephalic and atraumatic.     Right Ear: Tympanic membrane, ear canal and external ear normal.     Left Ear: Tympanic membrane, ear canal and external ear normal.     Nose: Nose normal.     Mouth/Throat:     Mouth: Mucous membranes are moist.  Eyes:     Extraocular Movements: Extraocular movements intact.     Conjunctiva/sclera: Conjunctivae normal.     Pupils: Pupils are equal, round, and reactive to light.  Cardiovascular:     Rate and Rhythm: Normal rate and regular rhythm.     Pulses: Normal pulses.     Heart sounds: Normal heart sounds.  Pulmonary:     Effort: Pulmonary effort is normal.     Breath sounds: Normal breath sounds.  Abdominal:     General: Abdomen is flat. Bowel sounds are normal.      Palpations: Abdomen is soft.  Musculoskeletal:        General: Normal range of motion.     Cervical back: Normal range of motion and neck supple.  Skin:    General: Skin is warm and dry.  Neurological:     General: No focal deficit present.     Mental Status: She is alert and oriented to person, place, and time.  Psychiatric:        Mood and Affect: Mood normal.        Behavior: Behavior normal.        Thought Content: Thought content normal.        Judgment: Judgment normal.            Arcangel Minion M Channie Bostick, PA-C

## 2024-01-06 ENCOUNTER — Other Ambulatory Visit: Payer: Self-pay | Admitting: Physician Assistant

## 2024-01-06 ENCOUNTER — Other Ambulatory Visit: Payer: Self-pay

## 2024-01-06 MED ORDER — SYRINGE/NEEDLE (DISP) 23G X 1" 3 ML MISC: 1.0000 | 1 refills | Status: AC

## 2024-01-08 ENCOUNTER — Encounter: Payer: Self-pay | Admitting: Physician Assistant

## 2024-01-08 ENCOUNTER — Ambulatory Visit: Payer: 59 | Admitting: Adult Health

## 2024-01-08 ENCOUNTER — Other Ambulatory Visit: Payer: Self-pay

## 2024-01-08 ENCOUNTER — Ambulatory Visit: Payer: 59 | Admitting: Physician Assistant

## 2024-01-08 ENCOUNTER — Ambulatory Visit: Payer: Self-pay | Admitting: Physician Assistant

## 2024-01-08 DIAGNOSIS — M25511 Pain in right shoulder: Secondary | ICD-10-CM | POA: Diagnosis not present

## 2024-01-08 MED ORDER — MELOXICAM 15 MG PO TABS: 15.0000 mg | ORAL_TABLET | Freq: Every day | ORAL | 0 refills | Status: DC

## 2024-01-08 MED ORDER — TRAMADOL HCL 50 MG PO TABS: 50.0000 mg | ORAL_TABLET | Freq: Four times a day (QID) | ORAL | 0 refills | Status: DC | PRN

## 2024-01-08 NOTE — Telephone Encounter (Signed)
FYI see triage note for patient

## 2024-01-08 NOTE — Telephone Encounter (Signed)
Copied from CRM [id number]. Topic: Clinical - Red Word Triage >> Jan 08, 2024 10:48 AM Gurney Maxin H wrote: [facility] that prompted transfer to Nurse Triage: Hurt shoulder, in pain can't use right arm. Picked up something last Saturday not realizing how heavy it was and heard her shoulder crack and pop. Can't extend arm, excruciating pain. Pain moving from shoulder to shoulder blade and up to neck, pain spreading.   Chief Complaint: Shoulder injury  Symptoms: Right shoulder pain, intermittent tingling  Frequency: Constant  Disposition: [] ED /[] Urgent Care (no appt availability in office) / [x] Appointment(In office/virtual)/ []  [facility]/ [] Home Care/ [] Refused Recommended Disposition /[] [facility]/ []  Follow-up with PCP Additional Notes: Patient reports that 6 days ago she was lifting a box and heard her shoulder crack and pop. She states that since that time she has been experiencing 8/10 pain in her right shoulder that is radiating down her arm. She states she has also had some tingling in her arm at night. Appointment made in an available office today for evaluation.     Reason for Disposition  Can't move injured shoulder normally (e.g., full range of motion, able to touch top of head)  Answer Assessment - Initial Assessment Questions 1. MECHANISM: "How did the injury happen?"     Lifting a heavy box  2. ONSET: "When did the injury happen?" (Minutes or hours ago)      6 days  3. APPEARANCE of INJURY: "What does the injury look like?"      Normal  4. SEVERITY: "Can you move the shoulder normally?"      Can't move due to pain  5. SIZE: For cuts, bruises, or swelling, ask: "How large is it?" (e.g., inches or centimeters;  entire joint)      No cuts or bruises  6. PAIN: "Is there pain?" If Yes, ask: "How bad is the pain?"   (e.g., Scale 1-10; or mild, moderate, severe)   - MILD (1-3): doesn't interfere with normal activities   - MODERATE (4-7): interferes with normal  activities (e.g., work or school) or awakens from sleep   - SEVERE (8-10): excruciating pain, unable to do any normal activities, unable to move arm at all due to pain     9/10 7. TETANUS: For any breaks in the skin, ask: "When was the last tetanus booster?"     N/A 8. OTHER SYMPTOMS: "Do you have any other symptoms?" (e.g., loss of sensation)     Tingling in arm  Protocols used: Shoulder Injury-A-AH

## 2024-01-08 NOTE — Progress Notes (Signed)
Office Visit Note   Patient: Courtney Watson           Date of Birth: 09-12-1961           MRN: [id number] Visit Date: 01/08/2024              Requested by: Allwardt, Crist Infante, PA-C [address] [place],  [place] [address] PCP: Bary Leriche, PA-C   Assessment & Plan: Visit Diagnoses:  1. Acute pain of right shoulder     Plan: The patient is a pleasant 63 year old right-hand-dominant woman who comes in today with a 6-day history of right acute onset of shoulder pain.  She said she was reaching for something in the refrigerator and it was heavier than she thought and her arm rotated back she said she felt something pulling and tearing at that point.  She was hopeful that this would get better with ibuprofen and conservative treatment.  Unfortunately it has gotten worse.  Her exam is very limited.  She can only bring her arm to about 90 degrees and cannot hold it up without the assistance of her other arm.  She also cannot internally rotate behind her back.  Concerns for rotator cuff tear she has some slight humeral head elevation subjectively on x-ray.  Will order a stat MRI.  In the meantime have instructed her to do some pendulum exercises as well have given her some meloxicam to take daily along with tramadol as needed.  An MRI will follow-up with Dr. August Saucer  Follow-Up Instructions: With Dr. August Saucer after MRI  Orders:  No orders of the defined types were placed in this encounter.  No orders of the defined types were placed in this encounter.     Procedures: No procedures performed   Clinical Data: No additional findings.   Subjective: No chief complaint on file.   HPI patient is a pleasant 63 year old woman who presents today for acute onset of right shoulder pain.  This started 6 days ago she was reaching in the fridge to get some teeth out not realizing how heavy it was since then she has had limited movement and pain of her right arm she cannot raise her right arm  above her head cannot lift anything heavy she has been taking ibuprofen no previous history of injury  Review of Systems  All other systems reviewed and are negative.    Objective: Vital Signs: There were no vitals taken for this visit.  Physical Exam Constitutional:      Appearance: Normal appearance.  Pulmonary:     Effort: Pulmonary effort is normal.  Skin:    General: Skin is warm and dry.  Neurological:     General: No focal deficit present.     Mental Status: She is alert and oriented to person, place, and time.  Psychiatric:        Mood and Affect: Mood normal.        Behavior: Behavior normal.     Ortho Exam Examination of her right shoulder she has no reproduction of symptoms or radicular findings with neck range of motion.  She can raise her arm passively with her other arm to 90 but cannot hold it up.  She has a strong radial pulse she is neurovascularly intact she has significant pain with internal rotation behind her back.  Again no evidence of any bruising in her bicep.  Grip strength is intact Specialty Comments:  No specialty comments available.  Imaging: No results found.  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   Sun-damaged skin 11/17/2023   Encounter for annual physical exam 11/07/2022   Arthritis pain of hand 11/07/2022   High cholesterol 02/19/2022   Essential hypertension 11/21/2021   Seasonal allergies    History of chicken pox    H/O rhinoplasty    Fibroid    Eating disorder    Colon polyps    Blood in stool    Abnormal uterine bleeding    B12 deficiency 04/09/2020   Left thyroid nodule    Upper airway cough syndrome 04/07/2019   Malignant tumor of colon (HCC) 04/01/2018   Perimenopausal disorder 04/01/2018   Uterine leiomyoma 04/01/2018   Palpitations 04/01/2018    Irregular heart beat 04/01/2018   Ulcerative colitis (HCC) 01/25/2018   Vitamin D deficiency 01/25/2018   Fatigue 01/25/2018   Shortness of breath on exertion 06/08/2017   Knee pain, right 08/22/2015   Cervical radiculopathy 01/10/2015   Anxiety 09/12/2014   Osteopenia 09/12/2014   Neck pain 09/12/2014   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   Cancer (HCC) 2005   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   Hypertension Father    Hyperlipidemia Father    Osteoarthritis Father    Rheum arthritis Father    Cancer Maternal Uncle 58       colon cancer   Depression Maternal Grandmother    Diabetes Maternal Grandfather    Heart disease Maternal Grandfather    Hypertension Maternal Grandfather     Breast cancer Paternal Grandmother    Cancer Paternal Grandfather     Past Surgical History:  Procedure Laterality Date   COLON SURGERY  2005   colon cancer--Dr.Rosenbower   ENDOMETRIAL ABLATION  2012   Dr.Lomax   MYOMECTOMY  2012   Dr.Lomax   Social History   Occupational History  Not on file  Tobacco Use   Smoking status: Former    Current packs/day: 0.00    Types: Cigarettes    Quit date: 11/18/2011    Years since quitting: 12.1    Passive exposure: Never   Smokeless tobacco: Never  Vaping Use   Vaping status: Every Day   Start date: 11/18/2011  Substance and Sexual Activity   Alcohol use: Yes    Alcohol/week: 4.0 standard drinks of alcohol    Types: 4 Cans of beer per week    Comment: on the weekends   Drug use: No   Sexual activity: Yes    Partners: Male    Birth control/protection: Post-menopausal    Comment: Ablation 2012

## 2024-01-08 NOTE — Telephone Encounter (Signed)
Noted.  I called pt directly and informed her about same-day injury clinic at [facility] as an option for today as well. She is going to check this out.

## 2024-01-11 ENCOUNTER — Encounter: Payer: Self-pay | Admitting: Physician Assistant

## 2024-01-20 ENCOUNTER — Ambulatory Visit
Admission: RE | Admit: 2024-01-20 | Discharge: 2024-01-20 | Disposition: A | Discharge status: Home / Self Care | Payer: 59 | Source: Ambulatory Visit | Attending: Physician Assistant | Admitting: Physician Assistant

## 2024-01-20 DIAGNOSIS — M25511 Pain in right shoulder: Secondary | ICD-10-CM

## 2024-01-20 MED ORDER — IOPAMIDOL (ISOVUE-M 200) INJECTION 41%
10.0000 mL | Freq: Once | INTRAMUSCULAR | Status: AC
  Administered 2024-01-20: 10 mL via INTRA_ARTICULAR

## 2024-01-21 ENCOUNTER — Telehealth: Payer: Self-pay | Admitting: Physician Assistant

## 2024-01-21 ENCOUNTER — Encounter: Payer: Self-pay | Admitting: Physician Assistant

## 2024-01-21 NOTE — Telephone Encounter (Signed)
 Patient called. Says she is returning a call to [facility].

## 2024-01-21 NOTE — Telephone Encounter (Signed)
 Please see pt request regarding recent MRI and reviewing results and advise patient

## 2024-01-22 ENCOUNTER — Ambulatory Visit: Admitting: Orthopedic Surgery

## 2024-01-22 ENCOUNTER — Encounter: Payer: Self-pay | Admitting: Orthopedic Surgery

## 2024-01-22 DIAGNOSIS — S46011D Strain of muscle(s) and tendon(s) of the rotator cuff of right shoulder, subsequent encounter: Secondary | ICD-10-CM

## 2024-01-22 DIAGNOSIS — M7521 Bicipital tendinitis, right shoulder: Secondary | ICD-10-CM

## 2024-01-22 NOTE — Progress Notes (Signed)
 Office Visit Note   Patient: Courtney Watson           Date of Birth: 06-25-1961           MRN: [id number] Visit Date: 01/22/2024 Requested by: Allwardt, Crist Infante, PA-C [address] [place],  [place] [address] PCP: Bary Leriche, PA-C  Subjective: No chief complaint on file.   HPI: Courtney Watson is a 63 y.o. female who presents to the office reporting right shoulder pain.  Since she was last seen she has had an MRI scan.  That MRI scan shows supraspinatus tear with 4 5 mm of retraction as well as near complete tear of the biceps tendon as well as significant partial versus full-thickness tearing of the subscap.  Patient felt a pop about a month ago when she was taking tea out of the refrigerator.  Has had pain and weakness since that time.  She does do office work including showing apartments.  Does have family at home.  Has a history of PVCs..                ROS: All systems reviewed are negative as they relate to the chief complaint within the history of present illness.  Patient denies fevers or chills.  Assessment & Plan: Visit Diagnoses: No diagnosis found.  Plan: Impression is rotator cuff tear right shoulder which is primarily an anterior superior tear.  Biceps tendon also symptomatic.  Plan right shoulder arthroscopy with debridement biceps tenodesis and rotator cuff tear repair.  Brace would be helpful for range of motion after surgery.  Currently her shoulder is a little bit on the stiff side in terms of forward flexion.  Would like for her to get at [facility] shoulder pulley to use prior to surgery in order to achieve as much range of motion as possible.  The risk and benefits of surgical intervention are discussed with the patient including not limited to infection or vessel damage incomplete pain relief as well as incomplete restoration of function.  Patient understands risk and benefits and wishes to to proceed.  Expected rehabilitation length discussed.  All questions  answered  Follow-Up Instructions: No follow-ups on file.   Orders:  No orders of the defined types were placed in this encounter.  No orders of the defined types were placed in this encounter.     Procedures: No procedures performed   Clinical Data: No additional findings.  Objective: Vital Signs: There were no vitals taken for this visit.  Physical Exam:  Constitutional: Patient appears well-developed HEENT:  Head: Normocephalic Eyes:EOM are normal Neck: Normal range of motion Cardiovascular: Normal rate Pulmonary/chest: Effort normal Neurologic: Patient is alert Skin: Skin is warm Psychiatric: Patient has normal mood and affect  Ortho Exam: Ortho exam demonstrates 5- out of 5 external rotation strength on the right compared to the left.  Subscap strength also is 5- out of 5 on the right compared to the left.  No Popeye deformity is present but significant tenderness to the biceps tendon is present along with pain was resisted supination on the right compared to the left.  No masses lymphadenopathy skin changes or bruising noted in the right shoulder region.  No discrete AC joint tenderness right versus left. Patient has bilateral 5 out of 5 grip EPL FPL interosseous wrist flexion wrist extension bicep triceps and deltoid strength.  Bilateral palpable radial pulses and no paresthesias C5-T1 in either arm.  Neck range of motion flexion chin to chest with extension  approximately 50 degrees with approximately 50 degrees of rotation bilaterally.  No masses lymphadenopathy or skin changes around the neck or shoulder girdle region bilaterally   Specialty Comments:  No specialty comments available.  Imaging: No results found.   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   Sun-damaged skin 11/17/2023    Encounter for annual physical exam 11/07/2022   Arthritis pain of hand 11/07/2022   High cholesterol 02/19/2022   Essential hypertension 11/21/2021   Seasonal allergies    History of chicken pox    H/O rhinoplasty    Fibroid    Eating disorder    Colon polyps    Blood in stool    Abnormal uterine bleeding    B12 deficiency 04/09/2020   Left thyroid nodule    Upper airway cough syndrome 04/07/2019   Malignant tumor of colon (HCC) 04/01/2018   Perimenopausal disorder 04/01/2018   Uterine leiomyoma 04/01/2018   Palpitations 04/01/2018   Irregular heart beat 04/01/2018   Ulcerative colitis (HCC) 01/25/2018   Vitamin D deficiency 01/25/2018   Fatigue 01/25/2018   Shortness of breath on exertion 06/08/2017   Knee pain, right 08/22/2015   Cervical radiculopathy 01/10/2015   Anxiety 09/12/2014   Osteopenia 09/12/2014   Neck pain 09/12/2014   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   Cancer (HCC) 2005   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   Hypertension Father    Hyperlipidemia Father    Osteoarthritis Father    Rheum arthritis Father    Cancer Maternal Uncle 26       colon cancer   Depression Maternal Grandmother    Diabetes Maternal Grandfather    Heart disease Maternal Grandfather    Hypertension Maternal Grandfather    Breast cancer Paternal Grandmother    Cancer Paternal Grandfather     Past Surgical History:  Procedure Laterality Date   COLON SURGERY  2005   colon cancer--Dr.Rosenbower   ENDOMETRIAL ABLATION  2012   Dr.Lomax   MYOMECTOMY  2012   Dr.Lomax   Social History   Occupational History   Not on file  Tobacco Use   Smoking status: Former    Current packs/day: 0.00    Types: Cigarettes    Quit date: 11/18/2011    Years since quitting: 12.1    Passive exposure: Never   Smokeless tobacco: Never  Vaping Use   Vaping status: Every Day   Start date: 11/18/2011  Substance and Sexual Activity   Alcohol use: Yes    Alcohol/week: 4.0 standard drinks of alcohol    Types: 4 Cans of beer per week    Comment: on the weekends   Drug use: No   Sexual activity: Yes    Partners: Male    Birth control/protection: Post-menopausal    Comment: Ablation 2012

## 2024-01-25 ENCOUNTER — Telehealth: Payer: Self-pay | Admitting: Orthopedic Surgery

## 2024-01-25 NOTE — Telephone Encounter (Signed)
 [employer] forms received for Courtney Watson.

## 2024-01-26 NOTE — Pre-Procedure Instructions (Signed)
 Surgical Instructions   Your procedure is scheduled on Tuesday, March 18th. Report to [facility] Main Entrance "A" at 12:50 P.M., then check in with the Admitting office. Any questions or running late day of surgery: call [phone]  Questions prior to your surgery date: call [phone], Monday-Friday, 8am-4pm. If you experience any cold or flu symptoms such as cough, fever, chills, shortness of breath, etc. between now and your scheduled surgery, please notify us at the above number.     Remember:  Do not eat after midnight the night before your surgery   You may drink clear liquids until 11:50 AM the morning of your surgery.   Clear liquids allowed are: Water, Non-Citrus Juices (without pulp), Carbonated Beverages, Clear Tea (no milk, honey, etc.), Black Coffee Only (NO MILK, CREAM OR POWDERED CREAMER of any kind), and Gatorade.  Patient Instructions  The night before surgery:  No food after midnight. ONLY clear liquids after midnight  The day of surgery (if you do NOT have diabetes):  Drink ONE (1) Pre-Surgery Clear Ensure by 11:50 AM the morning of surgery. Drink in one sitting. Do not sip.  This drink was given to you during your hospital  pre-op appointment visit.  Nothing else to drink after completing the  Pre-Surgery Clear Ensure.         If you have questions, please contact your surgeon's office.    Take these medicines the morning of surgery with A SIP OF WATER  sertraline (ZOLOFT)    May take these medicines IF NEEDED: ALPRAZolam (XANAX)  traMADol (ULTRAM)   One week prior to surgery, STOP taking any Aspirin (unless otherwise instructed by your surgeon) Aleve, Naproxen, Ibuprofen, Motrin, Advil, Goody's, BC's, all herbal medications, fish oil, and non-prescription vitamins. This includes diclofenac Sodium (VOLTAREN ARTHRITIS PAIN) gel and meloxicam (MOBIC).                      Do NOT Smoke (Tobacco/Vaping) for 24 hours prior to your procedure.  If you  use a CPAP at night, you may bring your mask/headgear for your overnight stay.   You will be asked to remove any contacts, glasses, piercing's, hearing aid's, dentures/partials prior to surgery. Please bring cases for these items if needed.    Patients discharged the day of surgery will not be allowed to drive home, and someone needs to stay with them for 24 hours.  SURGICAL WAITING ROOM VISITATION Patients may have no more than 2 support people in the waiting area - these visitors may rotate.   Pre-op nurse will coordinate an appropriate time for 1 ADULT support person, who may not rotate, to accompany patient in pre-op.  Children under the age of 34 must have an adult with them who is not the patient and must remain in the main waiting area with an adult.  If the patient needs to stay at the hospital during part of their recovery, the visitor guidelines for inpatient rooms apply.  Please refer to the [facility] website for the visitor guidelines for any additional information.   If you received a COVID test during your pre-op visit  it is requested that you wear a mask when out in public, stay away from anyone that may not be feeling well and notify your surgeon if you develop symptoms. If you have been in contact with anyone that has tested positive in the last 10 days please notify you surgeon.      Pre-operative CHG Bathing Instructions  You can play a key role in reducing the risk of infection after surgery. Your skin needs to be as free of germs as possible. You can reduce the number of germs on your skin by washing with CHG (chlorhexidine gluconate) soap before surgery. CHG is an antiseptic soap that kills germs and continues to kill germs even after washing.   DO NOT use if you have an allergy to chlorhexidine/CHG or antibacterial soaps. If your skin becomes reddened or irritated, stop using the CHG and notify one of our RNs at [phone].              TAKE A SHOWER THE NIGHT  BEFORE SURGERY AND THE DAY OF SURGERY    Please keep in mind the following:  DO NOT shave, including legs and underarms, 48 hours prior to surgery.   You may shave your face before/day of surgery.  Place clean sheets on your bed the night before surgery Use a clean washcloth (not used since being washed) for each shower. DO NOT sleep with pet's night before surgery.  CHG Shower Instructions:  Wash your face and private area with normal soap. If you choose to wash your hair, wash first with your normal shampoo.  After you use shampoo/soap, rinse your hair and body thoroughly to remove shampoo/soap residue.  Turn the water OFF and apply half the bottle of CHG soap to a CLEAN washcloth.  Apply CHG soap ONLY FROM YOUR NECK DOWN TO YOUR TOES (washing for 3-5 minutes)  DO NOT use CHG soap on face, private areas, open wounds, or sores.  Pay special attention to the area where your surgery is being performed.  If you are having back surgery, having someone wash your back for you may be helpful. Wait 2 minutes after CHG soap is applied, then you may rinse off the CHG soap.  Pat dry with a clean towel  Put on clean pajamas    Additional instructions for the day of surgery: DO NOT APPLY any lotions, deodorants, cologne, or perfumes.   Do not wear jewelry or makeup Do not wear nail polish, gel polish, artificial nails, or any other type of covering on natural nails (fingers and toes) Do not bring valuables to the hospital. [facility] is not responsible for valuables/personal belongings. Put on clean/comfortable clothes.  Please brush your teeth.  Ask your nurse before applying any prescription medications to the skin.

## 2024-01-27 ENCOUNTER — Encounter (HOSPITAL_COMMUNITY): Payer: Self-pay

## 2024-01-27 ENCOUNTER — Other Ambulatory Visit: Payer: Self-pay

## 2024-01-27 ENCOUNTER — Encounter (HOSPITAL_COMMUNITY)
Admission: RE | Admit: 2024-01-27 | Discharge: 2024-01-27 | Disposition: A | Discharge status: Home / Self Care | Source: Ambulatory Visit | Attending: Orthopedic Surgery | Admitting: Orthopedic Surgery

## 2024-01-27 DIAGNOSIS — Z01812 Encounter for preprocedural laboratory examination: Secondary | ICD-10-CM | POA: Diagnosis present

## 2024-01-27 DIAGNOSIS — Z01818 Encounter for other preprocedural examination: Secondary | ICD-10-CM

## 2024-01-27 LAB — BASIC METABOLIC PANEL
Anion gap: 10 (ref 5–15)
BUN: 11 mg/dL (ref 6–20)
CO2: 26 mmol/L (ref 22–32)
Calcium: 9.9 mg/dL (ref 8.9–10.3)
Chloride: 104 mmol/L (ref 98–111)
Creatinine, Ser: 0.57 mg/dL (ref 0.44–1.00)
GFR, Estimated: >60 mL/min (ref >60)
Glucose, Bld: 87 mg/dL (ref 70–99)
Potassium: 4.4 mmol/L (ref 3.5–5.1)
Sodium: 140 mmol/L (ref 135–145)

## 2024-01-27 LAB — CBC
HCT: 41 % (ref 36.0–46.0)
Hemoglobin: 13.4 g/dL (ref 12.0–15.0)
MCH: 30.9 pg (ref 26.0–34.0)
MCHC: 32.7 g/dL (ref 30.0–36.0)
MCV: 94.5 fL (ref 80.0–100.0)
Platelets: 241 10*3/uL (ref 150–400)
RBC: 4.34 MIL/uL (ref 3.87–5.11)
RDW: 12.7 % (ref 11.5–15.5)
WBC: 6.2 10*3/uL (ref 4.0–10.5)
nRBC: 0 % (ref 0.0–0.2)

## 2024-01-27 NOTE — Progress Notes (Signed)
 PCP - Ila Mcgill, PA-C Cardiologist - Darl Householder  PPM/ICD - denies  Device Orders -  Rep Notified -   Chest x-ray - na EKG - 04/15/23 Stress Test - na ECHO - 04/22/18 Cardiac Cath - denies  Sleep Study - denies CPAP -   Fasting Blood Sugar - na Checks Blood Sugar _____ times a day  Last dose of GLP1 agonist-  na GLP1 instructions:   Blood Thinner Instructions:na Aspirin Instructions:  ERAS Protcol - clears until 1150 PRE-SURGERY Ensure or G2- Ensure  COVID TEST- na   Anesthesia review: no  Patient denies shortness of breath, fever, cough and chest pain at PAT appointment   All instructions explained to the patient, with a verbal understanding of the material. Patient agrees to go over the instructions while at home for a better understanding. Patient also instructed to wear a mask when out in public prior to surgery. The opportunity to ask questions was provided.

## 2024-02-02 ENCOUNTER — Encounter (HOSPITAL_COMMUNITY): Payer: Self-pay | Admitting: Orthopedic Surgery

## 2024-02-02 ENCOUNTER — Encounter (HOSPITAL_COMMUNITY)
Admission: RE | Disposition: A | Discharge status: Home / Self Care | Payer: Self-pay | Source: Ambulatory Visit | Attending: Orthopedic Surgery

## 2024-02-02 ENCOUNTER — Ambulatory Visit (HOSPITAL_COMMUNITY)
Admission: RE | Admit: 2024-02-02 | Discharge: 2024-02-02 | Disposition: A | Discharge status: Home / Self Care | Source: Ambulatory Visit | Attending: Orthopedic Surgery | Admitting: Orthopedic Surgery

## 2024-02-02 ENCOUNTER — Ambulatory Visit (HOSPITAL_COMMUNITY): Payer: Self-pay | Admitting: Certified Registered Nurse Anesthetist

## 2024-02-02 DIAGNOSIS — I10 Essential (primary) hypertension: Secondary | ICD-10-CM | POA: Diagnosis not present

## 2024-02-02 DIAGNOSIS — M94211 Chondromalacia, right shoulder: Secondary | ICD-10-CM

## 2024-02-02 DIAGNOSIS — S46011A Strain of muscle(s) and tendon(s) of the rotator cuff of right shoulder, initial encounter: Secondary | ICD-10-CM | POA: Diagnosis present

## 2024-02-02 DIAGNOSIS — M75101 Unspecified rotator cuff tear or rupture of right shoulder, not specified as traumatic: Secondary | ICD-10-CM

## 2024-02-02 DIAGNOSIS — Z8711 Personal history of peptic ulcer disease: Secondary | ICD-10-CM | POA: Insufficient documentation

## 2024-02-02 DIAGNOSIS — Z8261 Family history of arthritis: Secondary | ICD-10-CM | POA: Insufficient documentation

## 2024-02-02 DIAGNOSIS — Z79899 Other long term (current) drug therapy: Secondary | ICD-10-CM | POA: Insufficient documentation

## 2024-02-02 DIAGNOSIS — F419 Anxiety disorder, unspecified: Secondary | ICD-10-CM | POA: Diagnosis not present

## 2024-02-02 DIAGNOSIS — Z8249 Family history of ischemic heart disease and other diseases of the circulatory system: Secondary | ICD-10-CM | POA: Insufficient documentation

## 2024-02-02 DIAGNOSIS — M65911 Unspecified synovitis and tenosynovitis, right shoulder: Secondary | ICD-10-CM | POA: Diagnosis not present

## 2024-02-02 DIAGNOSIS — S43431A Superior glenoid labrum lesion of right shoulder, initial encounter: Secondary | ICD-10-CM

## 2024-02-02 DIAGNOSIS — M65811 Other synovitis and tenosynovitis, right shoulder: Secondary | ICD-10-CM | POA: Diagnosis not present

## 2024-02-02 DIAGNOSIS — Z87891 Personal history of nicotine dependence: Secondary | ICD-10-CM | POA: Insufficient documentation

## 2024-02-02 DIAGNOSIS — M1990 Unspecified osteoarthritis, unspecified site: Secondary | ICD-10-CM | POA: Insufficient documentation

## 2024-02-02 DIAGNOSIS — M7521 Bicipital tendinitis, right shoulder: Secondary | ICD-10-CM

## 2024-02-02 DIAGNOSIS — G709 Myoneural disorder, unspecified: Secondary | ICD-10-CM | POA: Insufficient documentation

## 2024-02-02 DIAGNOSIS — X58XXXA Exposure to other specified factors, initial encounter: Secondary | ICD-10-CM | POA: Insufficient documentation

## 2024-02-02 DIAGNOSIS — Z01818 Encounter for other preprocedural examination: Secondary | ICD-10-CM

## 2024-02-02 HISTORY — PX: BICEPT TENODESIS: SHX5116

## 2024-02-02 HISTORY — PX: POSTERIOR LUMBAR FUSION 2 WITH HARDWARE REMOVAL: SHX7297

## 2024-02-02 HISTORY — PX: SHOULDER OPEN ROTATOR CUFF REPAIR: SHX2407

## 2024-02-02 SURGERY — ARTHROSCOPY, SHOULDER WITH DEBRIDEMENT
Anesthesia: Regional | Site: Shoulder | Laterality: Right

## 2024-02-02 MED ORDER — LIDOCAINE 2% (20 MG/ML) 5 ML SYRINGE
INTRAMUSCULAR | Status: DC | PRN
  Administered 2024-02-02: 40 mg via INTRAVENOUS

## 2024-02-02 MED ORDER — TRANEXAMIC ACID-NACL 1000-0.7 MG/100ML-% IV SOLN
INTRAVENOUS | Status: AC
  Filled 2024-02-02: qty 100

## 2024-02-02 MED ORDER — MELOXICAM 15 MG PO TABS: 15.0000 mg | ORAL_TABLET | Freq: Every day | ORAL | 0 refills | Status: DC

## 2024-02-02 MED ORDER — FENTANYL CITRATE (PF) 100 MCG/2ML IJ SOLN
INTRAMUSCULAR | Status: AC
  Filled 2024-02-02: qty 2

## 2024-02-02 MED ORDER — PROPOFOL 10 MG/ML IV BOLUS
INTRAVENOUS | Status: AC
  Filled 2024-02-02: qty 20

## 2024-02-02 MED ORDER — TRANEXAMIC ACID-NACL 1000-0.7 MG/100ML-% IV SOLN
1000.0000 mg | INTRAVENOUS | Status: AC
  Administered 2024-02-02: 1000 mg via INTRAVENOUS

## 2024-02-02 MED ORDER — PROPOFOL 10 MG/ML IV BOLUS
INTRAVENOUS | Status: DC | PRN
  Administered 2024-02-02: 100 mg via INTRAVENOUS
  Administered 2024-02-02: 50 mg via INTRAVENOUS

## 2024-02-02 MED ORDER — ROCURONIUM BROMIDE 10 MG/ML (PF) SYRINGE
PREFILLED_SYRINGE | INTRAVENOUS | Status: DC | PRN
  Administered 2024-02-02: 10 mg via INTRAVENOUS
  Administered 2024-02-02: 40 mg via INTRAVENOUS

## 2024-02-02 MED ORDER — SUGAMMADEX SODIUM 200 MG/2ML IV SOLN
INTRAVENOUS | Status: DC | PRN
  Administered 2024-02-02: 200 mg via INTRAVENOUS

## 2024-02-02 MED ORDER — METHOCARBAMOL 500 MG PO TABS: 500.0000 mg | ORAL_TABLET | Freq: Three times a day (TID) | ORAL | 1 refills | Status: DC | PRN

## 2024-02-02 MED ORDER — CHLORHEXIDINE GLUCONATE 0.12 % MT SOLN: 15.0000 mL | Freq: Once | OROMUCOSAL | Status: AC

## 2024-02-02 MED ORDER — BUPIVACAINE LIPOSOME 1.3 % IJ SUSP
INTRAMUSCULAR | Status: AC
  Filled 2024-02-02: qty 10

## 2024-02-02 MED ORDER — ONDANSETRON HCL 4 MG/2ML IJ SOLN
INTRAMUSCULAR | Status: DC | PRN
  Administered 2024-02-02: 4 mg via INTRAVENOUS

## 2024-02-02 MED ORDER — MIDAZOLAM HCL 2 MG/2ML IJ SOLN: 2.0000 mg | Freq: Once | INTRAMUSCULAR | Status: AC

## 2024-02-02 MED ORDER — LACTATED RINGERS IV SOLN: INTRAVENOUS | Status: DC

## 2024-02-02 MED ORDER — CEFAZOLIN SODIUM-DEXTROSE 2-4 GM/100ML-% IV SOLN
INTRAVENOUS | Status: AC
  Filled 2024-02-02: qty 100

## 2024-02-02 MED ORDER — ACETAMINOPHEN 500 MG PO TABS
1000.0000 mg | ORAL_TABLET | Freq: Once | ORAL | Status: AC
  Administered 2024-02-02: 1000 mg via ORAL
  Filled 2024-02-02: qty 2

## 2024-02-02 MED ORDER — MIDAZOLAM HCL 2 MG/2ML IJ SOLN
INTRAMUSCULAR | Status: AC
  Filled 2024-02-02: qty 2

## 2024-02-02 MED ORDER — ORAL CARE MOUTH RINSE: 15.0000 mL | Freq: Once | OROMUCOSAL | Status: AC

## 2024-02-02 MED ORDER — CEFAZOLIN SODIUM-DEXTROSE 2-4 GM/100ML-% IV SOLN
2.0000 g | INTRAVENOUS | Status: AC
  Administered 2024-02-02: 2 g via INTRAVENOUS

## 2024-02-02 MED ORDER — PHENYLEPHRINE HCL-NACL 20-0.9 MG/250ML-% IV SOLN
INTRAVENOUS | Status: DC | PRN
  Administered 2024-02-02: 20 ug/min via INTRAVENOUS

## 2024-02-02 MED ORDER — FENTANYL CITRATE (PF) 100 MCG/2ML IJ SOLN
INTRAMUSCULAR | Status: DC | PRN
  Administered 2024-02-02: 100 ug via INTRAVENOUS

## 2024-02-02 MED ORDER — DEXAMETHASONE SODIUM PHOSPHATE 10 MG/ML IJ SOLN
INTRAMUSCULAR | Status: DC | PRN
  Administered 2024-02-02: 10 mg via INTRAVENOUS

## 2024-02-02 MED ORDER — 0.9 % SODIUM CHLORIDE (POUR BTL) OPTIME
TOPICAL | Status: DC | PRN
  Administered 2024-02-02: 1000 mL

## 2024-02-02 MED ORDER — MIDAZOLAM HCL 2 MG/2ML IJ SOLN
INTRAMUSCULAR | Status: AC
  Administered 2024-02-02: 2 mg via INTRAVENOUS
  Filled 2024-02-02: qty 2

## 2024-02-02 MED ORDER — OXYCODONE HCL 5 MG PO TABS: 5.0000 mg | ORAL_TABLET | ORAL | 0 refills | Status: DC | PRN

## 2024-02-02 MED ORDER — MIDAZOLAM HCL 2 MG/2ML IJ SOLN
INTRAMUSCULAR | Status: DC | PRN
  Administered 2024-02-02: 2 mg via INTRAVENOUS

## 2024-02-02 MED ORDER — CHLORHEXIDINE GLUCONATE 0.12 % MT SOLN
OROMUCOSAL | Status: AC
Start: 2024-02-02 — End: 2024-02-02
  Administered 2024-02-02: 15 mL via OROMUCOSAL
  Filled 2024-02-02: qty 15

## 2024-02-02 MED ORDER — PHENYLEPHRINE 80 MCG/ML (10ML) SYRINGE FOR IV PUSH (FOR BLOOD PRESSURE SUPPORT)
PREFILLED_SYRINGE | INTRAVENOUS | Status: DC | PRN
  Administered 2024-02-02 (×2): 80 ug via INTRAVENOUS

## 2024-02-02 SURGICAL SUPPLY — 60 items
ANCHOR FBRTK 2.6 SUTURETAP 1.3 (Anchor) IMPLANT
ANCHOR SUT 1.8 FIBERTAK SB KL (Anchor) IMPLANT
ANCHOR SWIVELOCK BIO 4.75X19.1 (Anchor) IMPLANT
BAG COUNTER SPONGE SURGICOUNT (BAG) ×1 IMPLANT
BENZOIN TINCTURE PRP APPL 2/3 (GAUZE/BANDAGES/DRESSINGS) ×1 IMPLANT
BLADE SURG 15 STRL LF DISP TIS (BLADE) IMPLANT
COVER SURGICAL LIGHT HANDLE (MISCELLANEOUS) ×1 IMPLANT
DRAPE IMP U-DRAPE 54X76 (DRAPES) ×1 IMPLANT
DRAPE INCISE IOBAN 66X45 STRL (DRAPES) ×1 IMPLANT
DRAPE STERI 35X30 U-POUCH (DRAPES) IMPLANT
DRAPE SURG ORHT 6 SPLT 77X108 (DRAPES) ×1 IMPLANT
DRAPE U-SHAPE 47X51 STRL (DRAPES) ×1 IMPLANT
DRSG TEGADERM 4X4.75 (GAUZE/BANDAGES/DRESSINGS) IMPLANT
DURAPREP 26ML APPLICATOR (WOUND CARE) ×1 IMPLANT
ELECT CAUTERY BLADE 6.4 (BLADE) ×1 IMPLANT
ELECT REM PT RETURN 9FT ADLT (ELECTROSURGICAL) ×1 IMPLANT
ELECTRODE REM PT RTRN 9FT ADLT (ELECTROSURGICAL) ×1 IMPLANT
GAUZE PAD ABD 8X10 STRL (GAUZE/BANDAGES/DRESSINGS) ×1 IMPLANT
GAUZE SPONGE 4X4 12PLY STRL (GAUZE/BANDAGES/DRESSINGS) ×1 IMPLANT
GAUZE XEROFORM 1X8 LF (GAUZE/BANDAGES/DRESSINGS) ×1 IMPLANT
GLOVE BIOGEL PI IND STRL 8 (GLOVE) ×1 IMPLANT
GLOVE ECLIPSE 8.0 STRL XLNG CF (GLOVE) ×1 IMPLANT
GOWN STRL REUS W/ TWL LRG LVL3 (GOWN DISPOSABLE) ×2 IMPLANT
KIT BASIN OR (CUSTOM PROCEDURE TRAY) ×1 IMPLANT
KIT STR SPEAR 1.8 FBRTK DISP (KITS) IMPLANT
KIT TURNOVER KIT B (KITS) ×1 IMPLANT
MANIFOLD NEPTUNE II (INSTRUMENTS) ×1 IMPLANT
NDL 1/2 CIR CATGUT .05X1.09 (NEEDLE) IMPLANT
NDL HD SCORPION MEGA LOADER (NEEDLE) IMPLANT
NDL HYPO 25GX1X1/2 BEV (NEEDLE) ×1 IMPLANT
NEEDLE 1/2 CIR CATGUT .05X1.09 (NEEDLE) IMPLANT
NEEDLE HYPO 25GX1X1/2 BEV (NEEDLE) ×1 IMPLANT
NS IRRIG 1000ML POUR BTL (IV SOLUTION) ×1 IMPLANT
PACK SHOULDER (CUSTOM PROCEDURE TRAY) ×1 IMPLANT
PACK UNIVERSAL I (CUSTOM PROCEDURE TRAY) ×1 IMPLANT
PAD ARMBOARD POSITIONER FOAM (MISCELLANEOUS) ×2 IMPLANT
PASSER SUT SWANSON 36MM LOOP (INSTRUMENTS) IMPLANT
SLING ARM FOAM STRAP LRG (SOFTGOODS) IMPLANT
SPIKE FLUID TRANSFER (MISCELLANEOUS) IMPLANT
SPONGE T-LAP 4X18 ~~LOC~~+RFID (SPONGE) ×2 IMPLANT
STAPLER VISISTAT 35W (STAPLE) ×1 IMPLANT
STRIP CLOSURE SKIN 1/2X4 (GAUZE/BANDAGES/DRESSINGS) IMPLANT
SUCTION TUBE FRAZIER 10FR DISP (SUCTIONS) ×1 IMPLANT
SUT ETHILON 3 0 PS 1 (SUTURE) IMPLANT
SUT FIBERWIRE #2 38 T-5 BLUE (SUTURE) IMPLANT
SUT MNCRL AB 3-0 PS2 27 (SUTURE) IMPLANT
SUT VIC AB 0 CT1 27XBRD ANBCTR (SUTURE) ×1 IMPLANT
SUT VIC AB 1 CT1 27XBRD ANBCTR (SUTURE) IMPLANT
SUT VIC AB 1 CTX 27 (SUTURE) ×1 IMPLANT
SUT VIC AB 2-0 CT2 27 (SUTURE) ×1 IMPLANT
SUT VICRYL 0 UR6 27IN ABS (SUTURE) IMPLANT
SUTURE FIBERWR #2 38 T-5 BLUE (SUTURE) ×3 IMPLANT
SYR CONTROL 10ML LL (SYRINGE) ×1 IMPLANT
SYS FBRTK BUTTON 2.6 (Anchor) ×1 IMPLANT
SYSTEM FBRTK BUTTON 2.6 (Anchor) IMPLANT
TOWEL GREEN STERILE (TOWEL DISPOSABLE) ×1 IMPLANT
TOWEL GREEN STERILE FF (TOWEL DISPOSABLE) ×1 IMPLANT
TRAY FOLEY MTR SLVR 16FR STAT (SET/KITS/TRAYS/PACK) IMPLANT
WAND ABLATOR APOLLO I90 (BUR) IMPLANT
WATER STERILE IRR 1000ML POUR (IV SOLUTION) ×1 IMPLANT

## 2024-02-02 NOTE — Brief Op Note (Signed)
   02/02/2024  5:30 PM  PATIENT:  Courtney Watson  63 y.o. female  PRE-OPERATIVE DIAGNOSIS:  right shoulder rotator cuff tear, biceps tendinitis  POST-OPERATIVE DIAGNOSIS:  right shoulder rotator cuff tear, biceps tendinitis  PROCEDURE:  Procedure(s): ARTHROSCOPY, SHOULDER WITH DEBRIDEMENT REPAIR, ROTATOR CUFF, OPEN TENODESIS, BICEPS  SURGEON:  Surgeon(s): August Saucer, Corrie Mckusick, MD  ASSISTANT: Karenann Cai, PA  ANESTHESIA:   General  EBL: 15 ml    No intake/output data recorded.  BLOOD ADMINISTERED: none  DRAINS: None  LOCAL MEDICATIONS USED:  none  SPECIMEN:  No Specimen  COUNTS:  YES  TOURNIQUET:  * No tourniquets in log *  DICTATION: .Other Dictation: Dictation Number [id number]  PLAN OF CARE: Discharge to home after PACU  PATIENT DISPOSITION:  PACU - hemodynamically stable

## 2024-02-02 NOTE — Op Note (Signed)
 NAME: Courtney Watson, Courtney Watson MEDICAL RECORD NO: [id number] ACCOUNT NO: [id number] DATE OF BIRTH: March 16, 1961 FACILITY: [facility] LOCATION: [facility] PHYSICIAN: Graylin Shiver. August Saucer, MD  Operative Report   DATE OF PROCEDURE: 02/02/2024  PREOPERATIVE DIAGNOSIS:  Right shoulder rotator cuff tear and biceps tendinitis.  POSTOPERATIVE DIAGNOSIS:  Right shoulder rotator cuff tear and biceps tendinitis.  PROCEDURE:  Right shoulder arthroscopy with debridement of superior labrum, biceps tendon and rotator cuff with subsequent open biceps tenodesis and mini-open rotator cuff tear repair using a 2 x 2 construct of a 2 x 2 cm tear of the supraspinatus.  SURGEON:  Graylin Shiver. August Saucer, MD  ASSISTANT:  Karenann Cai, PA  INDICATIONS:  This is a 63 year old patient with right shoulder pain refractory to nonoperative management.  MRI scan shows rotator cuff tearing along with significant biceps tendinopathy.  She presents now for operative management after explanation of  risks and benefits.  DESCRIPTION OF PROCEDURE:  The patient was brought to the operating room where general endotracheal anesthesia was induced.  Preoperative antibiotics administered.  Timeout was called.  The patient was placed in the beach chair position with the head in  neutral position.  Right shoulder, arm and hand prescrubbed with alcohol and Betadine allowed to air dry, prepped with DuraPrep solution and draped in a sterile manner.  Ioban used to seal the operative field and cover the axilla.  Timeout was called.   Posterior portal was created 2 cm inferior and medial to the posterior margin of the acromion.  Diagnostic arthroscopy was performed.  Anterior portal was created under direct visualization.  There was a little synovitis in the shoulder, but her  examination under anesthesia demonstrated range of motion of 70/120/175.  This was compatible with her left shoulder.  The synovitis was debrided with the ArthroCare wand.  The biceps tendon had  significant tendinosis.  Rhona Raider was present.  This  involved the humeral head as well.  A few small areas were shaved down on the humeral head where the biceps tendinosis had started rubbing away the joint surface.  The glenoid articular surface intact.  Debridement was then performed with release of the  biceps tendon and debridement of the superior labrum from the 10 o'clock to 2 o'clock position where there was significant degenerative tearing and fraying.  Rotator cuff tear was identified.  This was also shaved down from the articular side.  2 x 2  tear involving the supraspinatus, which was about 90% to 95% thickness.  Following debridement, the instruments were removed from the anterior and posterior portals, which were closed using 3-0 nylon.  Collier Flowers was then used to cover the entire operative  field.  An incision was made off the anterolateral margin of the acromion.  Skin and subcutaneous tissue were sharply divided.  The deltoid was split and measured distance of 4 cm between the anterior and middle deltoid on the raphe.  Marked with #1  Vicryl suture.  Bursectomy performed.  Biceps tendon then tenodesed into the bicipital groove using two Arthrex suture anchors.  Superior suture anchor pulled out, but we still used the FiberLoop suture and tied it to surrounding tissue.  Oversewed that  with 2-0 Vicryl sutures and the inferior FiberTak suture anchor remained in place.  Good fixation under appropriate tension was achieved.  Subscapularis was then palpated.  From the articular side, it looked intact and from the bursal side also looked  intact.  No discrete cavities or deficits present.  The patient did have a tear of  the supraspinatus, 2 x 2 cm.  Rotator cuff edge was debrided sharply.  Footprint prepared.  Four 0 Vicryl tagging sutures placed in the leading edge of the tendon, which  mobilized easily back to its attachment on the footprint.  Two medial row anchors were placed, which were  SutureTape anchors from Arthrex.  These were tapped into position and they did hold well even though the bone was suboptimal.  The SutureTapes were  passed using a Scorpion suture passer equidistant from posterior to anterior.  Then, with the rotator cuff tear reduced, the SutureTapes were tied and the limbs were crossed into four limbs anterior and 4 limbs posterior.  The Vicryl suture limbs were  then crossed anterior and posterior and the SutureTape and Vicryl sutures were then placed in the [place] with the arm in abduction to achieve a watertight repair.  Acromioplasty performed with maintenance of the CA ligament.  Thorough irrigation was  then performed.  Arm taken through a range of motion with maintenance of range of motion compared to her preop status.  Irrigation was performed and the deltoid split was closed using #1 Vicryl suture followed by interrupted inverted 0 Vicryl suture, 2-0  Vicryl suture, and 3-0 Monocryl with Steri-Strips and impervious dressings applied throughout.  A shoulder immobilizer placed.  The patient tolerated the procedure well without immediate complications.  She was transferred to the recovery room in stable  condition.  She will follow up with [place] in one week.  Luke's assistance was required at all times for retraction, opening, closing, mobilization of tissues, his assistance was medical necessity.   PUS D: 02/02/2024 5:37:14 pm T: 02/02/2024 6:46:00 pm  JOB: [id number]/ [id number]

## 2024-02-02 NOTE — H&P (Signed)
 Courtney Watson is an 63 y.o. female.   Chief Complaint: right shoulder pain HPI: Courtney Watson is a 63 y.o. female who presents reporting right shoulder pain.  Since she was last seen she has had an MRI scan.  That MRI scan shows supraspinatus tear with 4 5 mm of retraction as well as near complete tear of the biceps tendon as well as significant partial versus full-thickness tearing of the subscap.  Patient felt a pop about a month ago when she was taking tea out of the refrigerator.  Has had pain and weakness since that time.  She does do office work including showing apartments.  Does have family at home.  Has a history of PVCs..   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   Malignant tumor of colon (HCC) 04/01/2018   Neck pain 09/12/2014   Osteopenia 2018   hips and spine   Palpitations 04/01/2018   Perimenopausal disorder 04/01/2018   Seasonal allergies    Shortness of breath on exertion 06/08/2017   Ulcerative colitis (HCC)    Uterine leiomyoma 04/01/2018   Vitamin D deficiency     Past Surgical History:  Procedure Laterality Date   COLON SURGERY  2005   colon cancer--Dr.Rosenbower   ENDOMETRIAL ABLATION  2012   Dr.Lomax   KNEE ARTHROSCOPY Right    MYOMECTOMY  2012   Dr.Lomax    Family History  Problem Relation Age of Onset   Osteoporosis Mother    Hypertension Mother    Arthritis Mother    Hearing loss Mother     Cancer Father        prostate   Parkinson's disease Father        Dec age 62 from parkinsons/Lewey Body Dementia?   Hypertension Father    Hyperlipidemia Father    Osteoarthritis Father    Rheum arthritis Father    Cancer Maternal Uncle 60       colon cancer   Depression Maternal Grandmother    Diabetes Maternal Grandfather    Heart disease Maternal Grandfather    Hypertension Maternal Grandfather    Breast cancer Paternal Grandmother    Cancer Paternal Grandfather    Social History:  reports that she quit smoking about 12 years ago. Her smoking use included cigarettes. She has never been exposed to tobacco smoke. She has never used smokeless tobacco. She reports current alcohol use of about 4.0 standard drinks of alcohol per week. She reports that she does not use drugs.  Allergies: No Known Allergies  Medications Prior to Admission  Medication Sig Dispense Refill   ALPRAZolam (XANAX) 0.5 MG tablet Take 1 tablet (0.5 mg total) by mouth 3 (three) times daily as needed. 60 tablet 0   Biotin 10 MG CAPS Take 10 mg by mouth daily.  Calcium Carbonate (CALCIUM 500 PO) Take 500 mg by mouth 2 (two) times daily.     Cholecalciferol (VITAMIN D3) 125 MCG (5000 UT) CAPS Take 5,000 Units by mouth daily.     cyanocobalamin (VITAMIN B12) 1000 MCG/ML injection INJECT INTO THE MUSCLE EVERY 30 DAYS AS DIRECTED 1 mL 2   lactobacillus acidophilus (BACID) TABS tablet Take 2 tablets by mouth 3 (three) times daily.     losartan-hydrochlorothiazide (HYZAAR) 100-12.5 MG tablet TAKE 1 TABLET BY MOUTH DAILY 90 tablet 1   meloxicam (MOBIC) 15 MG tablet Take 1 tablet (15 mg total) by mouth daily. 30 tablet 0   Multiple Vitamins-Minerals (HAIR/SKIN/NAILS/BIOTIN PO) Take 1 tablet by mouth daily.     sertraline (ZOLOFT) 100 MG tablet Take 1 tablet (100 mg total) by mouth daily. 90 tablet 3   clobetasol (TEMOVATE) 0.05 % external solution Apply 1 Application topically 2 (two) times daily. (Patient not  taking: Reported on 01/26/2024)     diclofenac Sodium (VOLTAREN ARTHRITIS PAIN) 1 % GEL Apply 2 g topically 4 (four) times daily. (Patient taking differently: Apply 2 g topically 4 (four) times daily as needed (pain).) 100 g 0   dicyclomine (BENTYL) 20 MG tablet Take 1 tablet (20 mg total) by mouth 2 (two) times daily. (Patient not taking: Reported on 11/17/2023) 20 tablet 0   SYRINGE-NEEDLE, DISP, 3 ML 23G X 1" 3 ML MISC 1 Device by Does not apply route every 30 (thirty) days. 50 each 1   traMADol (ULTRAM) 50 MG tablet Take 1 tablet (50 mg total) by mouth every 6 (six) hours as needed. 30 tablet 0    No results found for this or any previous visit (from the past 48 hours). No results found.  Review of Systems  Musculoskeletal:  Positive for arthralgias.    Blood pressure (!) 140/82, pulse 79, temperature 98.1 F (36.7 C), resp. rate 18, height 5\' 8"  (1.727 m), weight 70.8 kg, SpO2 95%. Physical Exam Vitals reviewed.  HENT:     Head: Normocephalic.     Nose: Nose normal.     Mouth/Throat:     Mouth: Mucous membranes are moist.  Eyes:     Pupils: Pupils are equal, round, and reactive to light.  Cardiovascular:     Rate and Rhythm: Normal rate.     Pulses: Normal pulses.  Pulmonary:     Effort: Pulmonary effort is normal.  Abdominal:     General: Abdomen is flat.  Musculoskeletal:     Cervical back: Normal range of motion.  Skin:    General: Skin is warm.     Capillary Refill: Capillary refill takes less than 2 seconds.  Neurological:     General: No focal deficit present.     Mental Status: She is alert.  Psychiatric:        Mood and Affect: Mood normal.     Ortho exam demonstrates 5- out of 5 external rotation strength on the right compared to the left. Subscap strength also is 5- out of 5 on the right compared to the left. No Popeye deformity is present but significant tenderness to the biceps tendon is present along with pain was resisted supination on the right compared  to the left. No masses lymphadenopathy skin changes or bruising noted in the right shoulder region. No discrete AC joint tenderness right versus left.  Assessment/Plan  Impression is rotator cuff tear right shoulder which is primarily an anterior superior tear.  Biceps tendon also symptomatic.  Plan  right shoulder arthroscopy with debridement biceps tenodesis and rotator cuff tear repair.  Brace would be helpful for range of motion after surgery.  Currently her shoulder is a little bit on the stiff side in terms of forward flexion.  Would like for her to get at [facility] shoulder pulley to use prior to surgery in order to achieve as much range of motion as possible.  The risk and benefits of surgical intervention are discussed with the patient including not limited to infection or vessel damage incomplete pain relief as well as incomplete restoration of function.  Patient understands risk and benefits and wishes to to proceed.  Expected rehabilitation length discussed.  All questions answered   Burnard Bunting, MD 02/02/2024, 2:11 PM

## 2024-02-02 NOTE — Transfer of Care (Signed)
 Immediate Anesthesia Transfer of Care Note  Patient: Courtney Watson  Procedure(s) Performed: ARTHROSCOPY, SHOULDER WITH DEBRIDEMENT (Right: Shoulder) REPAIR, ROTATOR CUFF, OPEN (Right: Shoulder) TENODESIS, BICEPS (Right: Shoulder)  Patient Location: PACU  Anesthesia Type:General  Level of Consciousness: awake  Airway & Oxygen Therapy: Patient Spontanous Breathing  Post-op Assessment: Report given to RN and Post -op Vital signs reviewed and stable  Post vital signs: Reviewed and stable  Last Vitals:  Vitals Value Taken Time  BP 130/80 02/02/24 1801  Temp 36.5 C 02/02/24 1752  Pulse 81 02/02/24 1808  Resp 16 02/02/24 1808  SpO2 94 % 02/02/24 1808  Vitals shown include unfiled device data.  Last Pain:  Vitals:   02/02/24 1752  PainSc: 0-No pain         Complications: No notable events documented.

## 2024-02-02 NOTE — Anesthesia Preprocedure Evaluation (Addendum)
 Anesthesia Evaluation  Patient identified by MRN, date of birth, ID band Patient awake    Reviewed: Allergy & Precautions, NPO status , Patient's Chart, lab work & pertinent test results  Airway Mallampati: II  TM Distance: >3 FB Neck ROM: Full    Dental no notable dental hx.    Pulmonary former smoker   Pulmonary exam normal        Cardiovascular hypertension, Pt. on medications Normal cardiovascular exam     Neuro/Psych  PSYCHIATRIC DISORDERS Anxiety      Neuromuscular disease    GI/Hepatic Neg liver ROS, PUD,,,  Endo/Other  negative endocrine ROS    Renal/GU negative Renal ROS     Musculoskeletal  (+) Arthritis ,    Abdominal   Peds  Hematology negative hematology ROS (+)   Anesthesia Other Findings Traumatic tear of right rotator cuff, unspecified tear extent, initial encounter Biceps tendinitis of right upper extremity Pre-op diagnosis: right shoulder rotator cuff tear, biceps tendinitis    Reproductive/Obstetrics                             Anesthesia Physical Anesthesia Plan  ASA: 2  Anesthesia Plan: General and Regional   Post-op Pain Management: Regional block*   Induction: Intravenous  PONV Risk Score and Plan: 3 and Ondansetron, Dexamethasone, Midazolam and Treatment may vary due to age or medical condition  Airway Management Planned: Oral ETT  Additional Equipment:   Intra-op Plan:   Post-operative Plan: Extubation in OR  Informed Consent: I have reviewed the patients History and Physical, chart, labs and discussed the procedure including the risks, benefits and alternatives for the proposed anesthesia with the patient or authorized representative who has indicated his/her understanding and acceptance.     Dental advisory given  Plan Discussed with: CRNA  Anesthesia Plan Comments:        Anesthesia Quick Evaluation

## 2024-02-02 NOTE — Anesthesia Procedure Notes (Signed)
 Procedure Name: Intubation Date/Time: 02/02/2024 3:29 PM  Performed by: Camillia Herter, CRNAPre-anesthesia Checklist: Patient identified, Emergency Drugs available, Suction available and Patient being monitored Patient Re-evaluated:Patient Re-evaluated prior to induction Oxygen Delivery Method: Circle System Utilized Preoxygenation: Pre-oxygenation with 100% oxygen Induction Type: IV induction Ventilation: Mask ventilation without difficulty Laryngoscope Size: Miller and 2 Grade View: Grade I Tube type: Oral Tube size: 7.0 mm Number of attempts: 1 Airway Equipment and Method: Stylet and Oral airway Placement Confirmation: ETT inserted through vocal cords under direct vision, positive ETCO2 and breath sounds checked- equal and bilateral Secured at: 21 cm Tube secured with: Tape Dental Injury: Teeth and Oropharynx as per pre-operative assessment

## 2024-02-03 ENCOUNTER — Encounter (HOSPITAL_COMMUNITY): Payer: Self-pay | Admitting: Orthopedic Surgery

## 2024-02-03 ENCOUNTER — Ambulatory Visit: Admitting: Orthopedic Surgery

## 2024-02-03 ENCOUNTER — Telehealth: Payer: Self-pay

## 2024-02-03 MED ORDER — BUPIVACAINE HCL (PF) 0.5 % IJ SOLN
INTRAMUSCULAR | Status: DC | PRN
  Administered 2024-02-02: 15 mL via PERINEURAL

## 2024-02-03 MED ORDER — BUPIVACAINE LIPOSOME 1.3 % IJ SUSP
INTRAMUSCULAR | Status: DC | PRN
  Administered 2024-02-02: 10 mL via PERINEURAL

## 2024-02-03 NOTE — Telephone Encounter (Signed)
 I called.  I think they could take oxycodone 5 mg 1-2 every 3-4 hours if needed.  Hold off on pendulums and she can start that tomorrow.

## 2024-02-03 NOTE — Telephone Encounter (Signed)
 Patient called wanting to know if there is something she could take in between the Oxycodone.  Stated that the Oxycodone is not working.  Patient had right shoulder surgery 02/02/2024.  CB#[phone].  Please advise.  Thank you.

## 2024-02-03 NOTE — Anesthesia Procedure Notes (Signed)
 Anesthesia Regional Block: Interscalene brachial plexus block   Pre-Anesthetic Checklist: , timeout performed,  Correct Patient, Correct Site, Correct Laterality,  Correct Procedure, Correct Position, site marked,  Risks and benefits discussed,  Surgical consent,  Pre-op evaluation,  At surgeon's request and post-op pain management  Laterality: Right  Prep: chloraprep       Needles:  Injection technique: Single-shot  Needle Type: Echogenic Stimulator Needle     Needle Length: 9cm  Needle Gauge: 21     Additional Needles:   Procedures:,,,, ultrasound used (permanent image in chart),,    Narrative:  Start time: 02/02/2024 2:20 PM End time: 02/02/2024 2:30 PM Injection made incrementally with aspirations every 5 mL.  Performed by: Personally  Anesthesiologist: Leonides Grills, MD  Additional Notes: Functioning IV was confirmed and monitors were applied.  A timeout was performed. Sterile prep, hand hygiene and sterile gloves were used. A 90mm 21ga Arrow echogenic stimulator needle was used. Negative aspiration and negative test dose prior to incremental administration of local anesthetic. The patient tolerated the procedure well.  Ultrasound guidance: relevent anatomy identified, needle position confirmed, local anesthetic spread visualized around nerve(s), vascular puncture avoided.  Image printed for medical record.

## 2024-02-05 NOTE — Anesthesia Postprocedure Evaluation (Signed)
 Anesthesia Post Note  Patient: Courtney Watson  Procedure(s) Performed: ARTHROSCOPY, SHOULDER WITH DEBRIDEMENT (Right: Shoulder) REPAIR, ROTATOR CUFF, OPEN (Right: Shoulder) TENODESIS, BICEPS (Right: Shoulder)     Patient location during evaluation: PACU Anesthesia Type: Regional and General Level of consciousness: awake Pain management: pain level controlled Vital Signs Assessment: post-procedure vital signs reviewed and stable Respiratory status: spontaneous breathing, nonlabored ventilation and respiratory function stable Cardiovascular status: blood pressure returned to baseline and stable Postop Assessment: no apparent nausea or vomiting Anesthetic complications: no   No notable events documented.  Last Vitals:  Vitals:   02/02/24 1815 02/02/24 1822  BP: 136/73 136/79  Pulse: 75 78  Resp: 15 17  Temp:  36.5 C  SpO2: 94% 93%    Last Pain:  Vitals:   02/02/24 1752  PainSc: 0-No pain                 Kimi Bordeau P Brnadon Eoff

## 2024-02-07 DIAGNOSIS — M65911 Unspecified synovitis and tenosynovitis, right shoulder: Secondary | ICD-10-CM

## 2024-02-07 DIAGNOSIS — M7521 Bicipital tendinitis, right shoulder: Secondary | ICD-10-CM

## 2024-02-07 DIAGNOSIS — M75101 Unspecified rotator cuff tear or rupture of right shoulder, not specified as traumatic: Secondary | ICD-10-CM

## 2024-02-07 DIAGNOSIS — S43431A Superior glenoid labrum lesion of right shoulder, initial encounter: Secondary | ICD-10-CM

## 2024-02-07 DIAGNOSIS — M94211 Chondromalacia, right shoulder: Secondary | ICD-10-CM

## 2024-02-07 HISTORY — DX: Chondromalacia, right shoulder: M94.211

## 2024-02-07 HISTORY — DX: Superior glenoid labrum lesion of right shoulder, initial encounter: S43.431A

## 2024-02-07 HISTORY — DX: Unspecified synovitis and tenosynovitis, right shoulder: M65.911

## 2024-02-07 HISTORY — DX: Bicipital tendinitis, right shoulder: M75.21

## 2024-02-10 ENCOUNTER — Ambulatory Visit (INDEPENDENT_AMBULATORY_CARE_PROVIDER_SITE_OTHER): Admitting: Surgical

## 2024-02-10 ENCOUNTER — Encounter: Payer: Self-pay | Admitting: Surgical

## 2024-02-10 DIAGNOSIS — Z9889 Other specified postprocedural states: Secondary | ICD-10-CM

## 2024-02-14 ENCOUNTER — Encounter: Payer: Self-pay | Admitting: Surgical

## 2024-02-14 NOTE — Progress Notes (Signed)
 Post-Op Visit Note   Patient: Courtney Watson           Date of Birth: 08-19-1961           MRN: [id number] Visit Date: 02/10/2024 PCP: Bary Leriche, PA-C   Assessment & Plan:  Chief Complaint:  Chief Complaint  Patient presents with   Right Shoulder - Routine Post Op   Visit Diagnoses:  1. S/P rotator cuff repair     Plan: Courtney Watson is a 63 y.o. female who presents s/p right shoulder rotator cuff repair and biceps tenodesis on 02/02/2024.  Patient is doing well and pain is overall controlled.  Denies any chest pain, SOB, fevers, chills. Taking pain medication about once a day along with meloxicam.  Has had some bruising.  Overall she seems to be doing well and feels a lot better than she did several days ago..    On exam, patient has range of motion 10 degrees external rotation, 65 degrees abduction, 90 degrees forward elevation passively.  Intact EPL, FPL, finger abduction, finger adduction, pronation/supination, bicep, tricep, deltoid of operative extremity.  Axillary nerve intact with deltoid firing.  Incisions are healing well without evidence of infection or dehiscence.  Sutures removed and replaced with Steri-Strips today.  2+ radial pulse of the operative extremity  Plan is continue with passive range of motion only.  We discussed this restriction in depth today as well as the potential risks of any active range of motion or any lifting with the operative shoulder.  She will avoid active motion/lifting.  We will see her back in 2 weeks for clinical recheck and initiation of physical therapy at that time.  Call with any concerns in the meantime..   Follow-Up Instructions: No follow-ups on file.   Orders:  No orders of the defined types were placed in this encounter.  No orders of the defined types were placed in this encounter.   Imaging: No results found.  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   Synovitis of right shoulder 02/07/2024   Biceps tendonitis on right 02/07/2024   Pain in right shoulder 01/08/2024   Inflamed seborrheic keratosis 11/17/2023   Nevus of right shoulder 11/17/2023   Onychoschizia 11/17/2023   Sun-damaged skin 11/17/2023   Encounter for annual physical exam 11/07/2022   Arthritis pain of hand 11/07/2022   High cholesterol 02/19/2022   Essential hypertension 11/21/2021   Seasonal allergies    History of chicken pox    H/O rhinoplasty    Fibroid    Eating disorder    Colon polyps    Blood in stool    Abnormal uterine bleeding    B12 deficiency 04/09/2020   Left thyroid nodule    Upper airway cough syndrome 04/07/2019   Malignant tumor of colon (HCC) 04/01/2018   Perimenopausal disorder 04/01/2018   Uterine leiomyoma 04/01/2018   Palpitations 04/01/2018   Irregular heart beat 04/01/2018   Ulcerative colitis (HCC) 01/25/2018   Vitamin D deficiency 01/25/2018   Fatigue 01/25/2018   Shortness of breath on exertion 06/08/2017   Knee pain, right 08/22/2015   Cervical radiculopathy 01/10/2015   Anxiety 09/12/2014   Osteopenia 09/12/2014   Neck pain 09/12/2014   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  Cancer (HCC) 2005   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   Overview:  Clinically Left C4 Clinically Left C4   Cancer (HCC) 2005   colon    Cervical radiculopathy 01/10/2015   Colon polyps    Eating disorder    Fatigue 01/25/2018   Fibroid    H/O rhinoplasty    History of chicken pox    Hypertension    Irregular heart beat 04/01/2018   Knee pain, right 08/22/2015   Left thyroid nodule     needs follow up ultrasound in 2021   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   Hypertension Father    Hyperlipidemia Father    Osteoarthritis Father    Rheum arthritis Father    Cancer Maternal Uncle 84       colon cancer   Depression Maternal Grandmother    Diabetes Maternal Grandfather    Heart disease Maternal Grandfather    Hypertension Maternal Grandfather    Breast cancer Paternal Grandmother    Cancer Paternal Grandfather     Past Surgical History:  Procedure Laterality Date   BICEPT TENODESIS Right 02/02/2024   Procedure: TENODESIS, BICEPS;  Surgeon: Cammy Copa, MD;  Location: [facility] OR;  Service: Orthopedics;  Laterality: Right;   COLON SURGERY  2005   colon cancer--Dr.Rosenbower   ENDOMETRIAL ABLATION  2012   Dr.Lomax   KNEE ARTHROSCOPY Right    MYOMECTOMY  2012   Dr.Lomax   POSTERIOR LUMBAR FUSION 2 WITH HARDWARE REMOVAL Right 02/02/2024   Procedure: ARTHROSCOPY, SHOULDER WITH DEBRIDEMENT;  Surgeon: Cammy Copa, MD;  Location: [facility] OR;  Service: Orthopedics;  Laterality: Right;  PROCEDURE:  RIGHT SHOULDER ARTHROSCOPY, DEBRIDEMENT, MINI OPEN BICEPS TENODESIS & ROTATOR CUFF TEAR REPAIR -SUPRASPINATUS AND SUBSCAPULARIS   SHOULDER OPEN ROTATOR CUFF REPAIR Right 02/02/2024   Procedure: REPAIR, ROTATOR CUFF, OPEN;  Surgeon: Cammy Copa, MD;  Location: [facility] OR;  Service:  Orthopedics;  Laterality: Right;   Social History   Occupational History   Not on file  Tobacco Use   Smoking status: Former    Current packs/day: 0.00    Types: Cigarettes    Quit date: 11/18/2011    Years since quitting: 12.2    Passive exposure: Never   Smokeless tobacco: Never  Vaping Use   Vaping status: Every Day   Start date: 11/18/2011  Substance and Sexual Activity   Alcohol use: Yes    Alcohol/week: 4.0 standard drinks of alcohol    Types: 4 Cans of beer per week    Comment: on the weekends   Drug use: No  Sexual activity: Yes    Partners: Male    Birth control/protection: Post-menopausal    Comment: Ablation 2012

## 2024-02-24 ENCOUNTER — Encounter: Payer: Self-pay | Admitting: Surgical

## 2024-02-24 ENCOUNTER — Ambulatory Visit (INDEPENDENT_AMBULATORY_CARE_PROVIDER_SITE_OTHER): Admitting: Surgical

## 2024-02-24 DIAGNOSIS — Z9889 Other specified postprocedural states: Secondary | ICD-10-CM

## 2024-02-24 NOTE — Progress Notes (Signed)
 Post-Op Visit Note   Patient: Courtney Watson           Date of Birth: July 28, 1961           MRN: [id number] Visit Date: 02/24/2024 PCP: Bary Leriche, PA-C   Assessment & Plan:  Chief Complaint:  Chief Complaint  Patient presents with   Right Shoulder - Routine Post Op, Follow-up    02/02/2024 right shoulder arthroscopy, RCR, BT   Visit Diagnoses:  1. S/P rotator cuff repair     Plan: Courtney Watson is a 63 y.o. female who presents s/p right shoulder rotator cuff repair and biceps tenodesis on 02/02/2024.  Patient is doing well and pain is overall controlled.  Pain is improved compared with last visit.  Every once a while she will have a twinge of pain but she has not plateaued yet and feels pain is continually improving.  Not taking any medications for pain.  Wears sling all the time.  Has been doing passive range of motion exercises with a pulley and with pendulum's as well as home exercises focusing on external rotation.  On exam, patient has range of motion 10 degrees X rotation, 50 degrees abduction, 100 degrees forward elevation passively.  Intact EPL, FPL, finger abduction, finger adduction, pronation/supination, bicep, tricep, deltoid of operative extremity.  Axillary nerve intact with deltoid firing.  Incisions are well-healed.  2+ radial pulse of the operative extremity  Plan is continue with range of motion exercises.  Start physical therapy upstairs to focus on passive and active assisted range of motion of the right shoulder (okay to start active assisted range of motion on 03/01/24).  Okay to start full active range of motion and rotator cuff strengthening exercises on 03/15/2024.  Follow-up in 3 weeks for clinical recheck and determination of return to work potentially.  Follow-Up Instructions: No follow-ups on file.   Orders:  No orders of the defined types were placed in this encounter.  No orders of the defined types were placed in this encounter.   Imaging: No  results found.  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   Synovitis of right shoulder 02/07/2024   Biceps tendonitis on right 02/07/2024   Pain in right shoulder 01/08/2024   Inflamed seborrheic keratosis 11/17/2023   Nevus of right shoulder 11/17/2023   Onychoschizia 11/17/2023   Sun-damaged skin 11/17/2023   Encounter for annual physical exam 11/07/2022   Arthritis pain of hand 11/07/2022   High cholesterol 02/19/2022   Essential hypertension 11/21/2021   Seasonal allergies    History of chicken pox    H/O rhinoplasty    Fibroid    Eating disorder    Colon polyps    Blood in stool    Abnormal uterine bleeding    B12 deficiency 04/09/2020   Left thyroid nodule    Upper airway cough syndrome 04/07/2019   Malignant tumor of colon (HCC) 04/01/2018   Perimenopausal disorder 04/01/2018   Uterine leiomyoma 04/01/2018   Palpitations 04/01/2018   Irregular heart beat 04/01/2018   Ulcerative colitis (HCC) 01/25/2018   Vitamin D deficiency 01/25/2018   Fatigue 01/25/2018   Shortness of breath on exertion 06/08/2017   Knee pain, right 08/22/2015   Cervical radiculopathy 01/10/2015   Anxiety 09/12/2014   Osteopenia 09/12/2014   Neck pain 09/12/2014  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   Cancer (HCC) 2005   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   Overview:  Clinically Left C4 Clinically Left C4   Cancer (HCC) 2005   colon    Cervical radiculopathy 01/10/2015   Colon polyps    Eating disorder    Fatigue 01/25/2018   Fibroid    H/O rhinoplasty    History of  chicken pox    Hypertension    Irregular heart beat 04/01/2018   Knee pain, right 08/22/2015   Left thyroid nodule    needs follow up ultrasound in 2021   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   Hypertension Father    Hyperlipidemia Father    Osteoarthritis Father    Rheum arthritis Father    Cancer Maternal Uncle 71       colon cancer   Depression Maternal Grandmother    Diabetes Maternal Grandfather    Heart disease Maternal Grandfather    Hypertension Maternal Grandfather    Breast cancer Paternal Grandmother    Cancer Paternal Grandfather     Past Surgical History:  Procedure Laterality Date   BICEPT TENODESIS Right 02/02/2024   Procedure: TENODESIS, BICEPS;  Surgeon: Cammy Copa, MD;  Location: [facility] OR;  Service: Orthopedics;  Laterality: Right;   COLON SURGERY  2005   colon cancer--Dr.Rosenbower   ENDOMETRIAL ABLATION  2012   Dr.Lomax   KNEE ARTHROSCOPY Right    MYOMECTOMY  2012   Dr.Lomax   POSTERIOR LUMBAR FUSION 2 WITH HARDWARE REMOVAL Right 02/02/2024   Procedure: ARTHROSCOPY, SHOULDER WITH DEBRIDEMENT;  Surgeon: Cammy Copa, MD;  Location: [facility] OR;  Service: Orthopedics;  Laterality: Right;  PROCEDURE:  RIGHT SHOULDER ARTHROSCOPY, DEBRIDEMENT, MINI OPEN BICEPS TENODESIS & ROTATOR CUFF TEAR REPAIR -SUPRASPINATUS AND SUBSCAPULARIS   SHOULDER OPEN ROTATOR CUFF REPAIR  Right 02/02/2024   Procedure: REPAIR, ROTATOR CUFF, OPEN;  Surgeon: Cammy Copa, MD;  Location: [facility] OR;  Service: Orthopedics;  Laterality: Right;   Social History   Occupational History   Not on file  Tobacco Use   Smoking status: Former    Current packs/day: 0.00    Types: Cigarettes    Quit date: 11/18/2011    Years since quitting: 12.2    Passive exposure: Never   Smokeless tobacco: Never  Vaping Use   Vaping status: Every Day   Start date: 11/18/2011  Substance and Sexual Activity   Alcohol use: Yes    Alcohol/week: 4.0 standard drinks of alcohol    Types: 4 Cans of  beer per week    Comment: on the weekends   Drug use: No   Sexual activity: Yes    Partners: Male    Birth control/protection: Post-menopausal    Comment: Ablation 2012

## 2024-02-24 NOTE — Addendum Note (Signed)
 Addended by: Rogers Seeds on: 02/24/2024 03:17 PM   Modules accepted: Orders

## 2024-02-26 ENCOUNTER — Telehealth: Payer: Self-pay | Admitting: Surgical

## 2024-02-26 ENCOUNTER — Encounter: Payer: Self-pay | Admitting: Physical Therapy

## 2024-02-26 ENCOUNTER — Ambulatory Visit: Admitting: Physical Therapy

## 2024-02-26 ENCOUNTER — Encounter (HOSPITAL_BASED_OUTPATIENT_CLINIC_OR_DEPARTMENT_OTHER): Payer: Self-pay

## 2024-02-26 ENCOUNTER — Other Ambulatory Visit: Payer: Self-pay

## 2024-02-26 DIAGNOSIS — M6281 Muscle weakness (generalized): Secondary | ICD-10-CM

## 2024-02-26 DIAGNOSIS — R293 Abnormal posture: Secondary | ICD-10-CM | POA: Diagnosis not present

## 2024-02-26 DIAGNOSIS — M25611 Stiffness of right shoulder, not elsewhere classified: Secondary | ICD-10-CM | POA: Diagnosis not present

## 2024-02-26 DIAGNOSIS — M25511 Pain in right shoulder: Secondary | ICD-10-CM | POA: Diagnosis not present

## 2024-02-26 NOTE — Telephone Encounter (Signed)
 Note in chart, Morrie Sheldon can you please print for the patient and let her know? She is upstairs in PT right now

## 2024-02-26 NOTE — Telephone Encounter (Signed)
 Pt walked in office requesting a letter to return back to work Tuesday 03/01/24 with light restrictions. No heavy lifting or driving. Pt has an appt with PT so pt will be in office to pick up note.

## 2024-02-26 NOTE — Telephone Encounter (Signed)
 Okay to return to work for her to perform desk work and ancillary activities for her job that do not require any lifting with the right arm.  She is not to do any lifting with the operative arm until released.  She should be allowed to wear her sling as a reminder not to do too much with the right arm but okay to remove the sling for activities such as typing/desk work.  Let me know if something like this note does not work and we need to change it.

## 2024-02-26 NOTE — Therapy (Signed)
 OUTPATIENT PHYSICAL THERAPY SHOULDER EVALUATION   Patient Name: Courtney Watson MRN: [id number] DOB:Aug 26, 1961, 63 y.o., female Today's Date: 02/26/2024  END OF SESSION:  PT End of Session - 02/26/24 1512     Visit Number 1    Number of Visits 19    Date for PT Re-Evaluation 04/22/24    Authorization Type [employer]    Authorization Time Period 02/26/24 to 04/22/24    PT Start Time 1433    PT Stop Time 1501   still in PROM/very limited phase of recovery, MD had already provided PROM based HEP, all skilled goals of eval session met   PT Time Calculation (min) 28 min    Activity Tolerance Patient tolerated treatment well    Behavior During Therapy [facility] for tasks assessed/performed             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   Malignant tumor of colon (HCC) 04/01/2018   Neck pain 09/12/2014   Osteopenia 2018   hips and spine   Palpitations 04/01/2018   Perimenopausal disorder 04/01/2018   Seasonal allergies    Shortness of breath on exertion 06/08/2017   Ulcerative colitis (HCC)    Uterine leiomyoma 04/01/2018   Vitamin D deficiency    Past Surgical History:  Procedure Laterality Date   BICEPT TENODESIS Right 02/02/2024   Procedure: TENODESIS, BICEPS;  Surgeon: Cammy Copa, MD;  Location: [facility] OR;  Service: Orthopedics;  Laterality: Right;   COLON SURGERY  2005   colon cancer--Dr.Rosenbower   ENDOMETRIAL  ABLATION  2012   Dr.Lomax   KNEE ARTHROSCOPY Right    MYOMECTOMY  2012   Dr.Lomax   POSTERIOR LUMBAR FUSION 2 WITH HARDWARE REMOVAL Right 02/02/2024   Procedure: ARTHROSCOPY, SHOULDER WITH DEBRIDEMENT;  Surgeon: Cammy Copa, MD;  Location: [facility] OR;  Service: Orthopedics;  Laterality: Right;  PROCEDURE:  RIGHT SHOULDER ARTHROSCOPY, DEBRIDEMENT, MINI OPEN BICEPS TENODESIS & ROTATOR CUFF TEAR REPAIR -SUPRASPINATUS AND SUBSCAPULARIS   SHOULDER OPEN ROTATOR CUFF REPAIR Right 02/02/2024   Procedure: REPAIR, ROTATOR CUFF, OPEN;  Surgeon: Cammy Copa, MD;  Location: [facility] OR;  Service: Orthopedics;  Laterality: Right;   Patient Active Problem List   Diagnosis Date Noted   Nontraumatic tear of right rotator cuff 02/07/2024   Degenerative superior labral anterior-to-posterior (SLAP) tear of right shoulder 02/07/2024   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   Synovitis of right shoulder 02/07/2024   Biceps tendonitis on right 02/07/2024   Pain in right shoulder 01/08/2024   Inflamed seborrheic keratosis 11/17/2023   Nevus of right shoulder 11/17/2023   Onychoschizia 11/17/2023   Sun-damaged skin 11/17/2023   Encounter for annual physical exam 11/07/2022   Arthritis pain of hand 11/07/2022   High cholesterol 02/19/2022   Essential hypertension 11/21/2021   Seasonal  allergies    History of chicken pox    H/O rhinoplasty    Fibroid    Eating disorder    Colon polyps    Blood in stool    Abnormal uterine bleeding    B12 deficiency 04/09/2020   Left thyroid nodule    Upper airway cough syndrome 04/07/2019   Malignant tumor of colon (HCC) 04/01/2018   Perimenopausal disorder 04/01/2018   Uterine leiomyoma 04/01/2018   Palpitations 04/01/2018   Irregular heart beat 04/01/2018   Ulcerative colitis (HCC) 01/25/2018   Vitamin D deficiency 01/25/2018   Fatigue 01/25/2018   Shortness of breath on exertion 06/08/2017   Knee pain, right 08/22/2015   Cervical radiculopathy  01/10/2015   Anxiety 09/12/2014   Osteopenia 09/12/2014   Neck pain 09/12/2014   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   Cancer (HCC) 2005    PCP: Allwardt, Alyssa PA-C   REFERRING PROVIDER: Coral Ceo  REFERRING DIAG: Please see note for PT instructions  Post right shoulder arthroscopy, RCR, BT on 02/02/2024  THERAPY DIAG:  Acute pain of right shoulder  Stiffness of right shoulder, not elsewhere classified  Muscle weakness (generalized)  Abnormal posture  Rationale for Evaluation and Treatment: Rehabilitation  ONSET DATE: Surgery 02/02/24   SUBJECTIVE:                                                                                                                                                                                      SUBJECTIVE STATEMENT:  Saw the doctor on Wednesday, he said OK to get out of the sling. Had already been doing pulleys and passive work on my own. Shoulder is just stiff and does not want to move.   Hand dominance: Right  PERTINENT HISTORY: See above   PAIN:  Are you having pain? No 0/10  PRECAUTIONS: Plan is continue with range of motion exercises.  Start physical therapy upstairs to focus on passive and active assisted range of motion of the right shoulder (okay to start active assisted range of motion on 03/01/24).  Okay to start full active range of motion and rotator cuff strengthening exercises on 03/15/2024.  Follow-up in 3 weeks for clinical recheck and determination of return to work potentially.  RED FLAGS: None   WEIGHT BEARING RESTRICTIONS: Yes NWB surgicial shoulder   FALLS:  Has patient fallen in last 6 months? 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  DIAGNOSTIC FINDINGS:   Narrative & Impression  CLINICAL DATA:  Acute right shoulder pain. Limited range of motion. Cracking and popping.   EXAM: MRI OF THE RIGHT SHOULDER WITH CONTRAST   TECHNIQUE: Multiplanar, multisequence MR imaging of the right shoulder was performed following the administration of intra-articular contrast.   CONTRAST:  See Injection Documentation.   COMPARISON:  right shoulder radiographs 01/08/2024   FINDINGS: Rotator cuff: There is abnormal extension of gadolinium contrast into the subacromial/subdeltoid bursa. This appears to be through a full-thickness tear of the anterior supraspinatus tendon footprint measuring up to approximately 9 mm in AP dimension (sagittal series 8, image 8 and with up to only 4 mm tendon retraction (coronal series 5 and 6 images 11 through 13). The infraspinatus is intact. There is high-grade partial thickness tearing of the subscapularis tendon diffusely, with likely a full-thickness component superiorly and only minimal anterior fibers inserting inferiorly (axial images 8 through 15). The teres minor is intact.   Muscles:  Mild to moderate supraspinatus muscle atrophy.   Biceps long head: The origin of the long head of the biceps tendon appears to be intact. The biceps tendon is markedly attenuated just proximal to the bicipital groove, and there appears to be an at least 90% partial-thickness and possible full-thickness tear of the tendon as it courses over the lesser tuberosity into the bicipital groove (axial images 11 through 13). A more normal caliber biceps tendon is seen within the more distal aspect of the  bicipital groove (axial images 17-20).   Acromioclavicular Joint: There are mild degenerative changes of the acromioclavicular joint including joint space narrowing, subchondral marrow edema, and peripheral osteophytosis. Type II acromion.   Glenohumeral Joint: Mild glenohumeral cartilage thinning.   Labrum: There is adequate distention of the glenohumeral joint following arthrogram injection. No glenoid labral tear is seen.   Bones:  No acute fracture.   Other: None.   IMPRESSION: 1. Full-thickness tear of the anterior supraspinatus tendon footprint measuring up to approximately 9 mm in AP dimension and with up to only 4 mm tendon retraction. Mild to moderate supraspinatus muscle atrophy. 2. High-grade partial thickness tearing of the subscapularis tendon diffusely, with likely a full-thickness component superiorly and only minimal intact anterior fibers inserting inferiorly. 3. The long head of the biceps tendon is markedly attenuated just proximal to the bicipital groove, and there appears to be an at least 90% partial-thickness and possible full-thickness tear of the tendon as it courses over the lesser tuberosity into the bicipital groove. 4. Mild degenerative changes of the acromioclavicular joint.    PATIENT SURVEYS:   Patient-Specific Activity Scoring Scheme  "0" represents "unable to perform." "10" represents "able to perform at prior level. 0 1 2 3 4 5 6 7 8 9  10 (Date and Score)   Activity Eval   1. Doing hair/make up  1   2. Cooking   3  3. Getting dressed  4  4. Pick up things  0  5. Brush teeth  4  6.  Write normally  2  Score 2.3   Total score = sum of the activity scores/number of activities Minimum detectable change (90%CI) for average score = 2 points Minimum  detectable change (90%CI) for single activity score = 3 points        COGNITION: Overall cognitive status: Within functional limits for tasks  assessed     SENSATION: WFL  POSTURE: Rounded shoulders, forward head   UPPER EXTREMITY ROM:   Passive ROM Right eval Left eval  Shoulder flexion Initially 40*, able to get to 90* passive with repeated reps   Shoulder extension    Shoulder scaption  Initially about 30*, able to get to about 75* passive with repeated reps    Shoulder adduction    Shoulder internal rotation Hand to belly at 0* ABD    Shoulder external rotation Initially -30* at 0* ABD, able to get to about -20* passive with repeated reps    Elbow flexion    Elbow extension    Wrist flexion    Wrist extension    Wrist ulnar deviation    Wrist radial deviation    Wrist pronation    Wrist supination    (Blank rows = not tested)  UPPER EXTREMITY MMT:  MMT Right eval Left eval  Shoulder flexion    Shoulder extension    Shoulder abduction    Shoulder adduction    Shoulder internal rotation    Shoulder external rotation    Middle trapezius    Lower trapezius    Elbow flexion    Elbow extension    Wrist flexion    Wrist extension    Wrist ulnar deviation    Wrist radial deviation    Wrist pronation    Wrist supination    Grip strength (lbs)    (Blank rows = not tested)  Did not check MMT at eval due to post-op precautions       PALPATION:  Scar initially restricted but got it moving well with a bit of scar massage, encouraged this at home                                                                                                                              TREATMENT DATE:   02/26/24  Eval, POC  PROM for flexion, scaption and ER   Education on scar massage, education on protection of surgery with progressive motion and precautions to allow for quality soft tissue healing without complications so that we can push her strength and function without issue later on. Continue with MD given HEP (passive ROM, pendulums, elbow ROM, pulleys) but add grip strength, wrist AROM, finger-thumb  opposition, scar massage)   PATIENT EDUCATION: Education details: as above  Person educated: Patient Education method: Programmer, multimedia, Facilities manager, and Handouts Education comprehension: verbalized understanding, returned demonstration, and needs further education  HOME EXERCISE PROGRAM: MD given program for now- will update after 03/01/24 when she can start AAROM  ASSESSMENT:  CLINICAL IMPRESSION: Patient is a 63 y.o. F who was seen today for physical therapy evaluation and treatment for skilled PT care following surgery for rotator cuff repair and biceps  TD. I was pleasantly surprised to see that she had already been given a great PROM routine/HEP by the surgeon, encouraged her to continue this with additions as above until we can start working on AAROM. Shoulder was fairly stiff but it did respond well to PROM techniques today, also worked on her scar a bit and this responded really well to manual too. Will really benefit from skilled PT services, do not anticipate significant complication with her course of rehab.   OBJECTIVE IMPAIRMENTS: decreased ROM, decreased strength, increased edema, increased fascial restrictions, increased muscle spasms, impaired flexibility, impaired UE functional use, improper body mechanics, postural dysfunction, and pain.   ACTIVITY LIMITATIONS: carrying, lifting, bathing, toileting, dressing, self feeding, reach over head, hygiene/grooming, and caring for others  PARTICIPATION LIMITATIONS: meal prep, cleaning, laundry, driving, shopping, community activity, occupation, and yard work  PERSONAL FACTORS: Time since onset of injury/illness/exacerbation are also affecting patient's functional outcome.   REHAB POTENTIAL: Excellent  CLINICAL DECISION MAKING: Stable/uncomplicated  EVALUATION COMPLEXITY: Low   GOALS: Goals reviewed with patient? No  SHORT TERM GOALS: Target date:  04/22/2024     Patient will be compliant with appropriate progressive HEP         GOAL STATUS: Initial  2. R shoulder flexion and ABD A/PROM to be at least 150*         GOAL STATUS: Initial    3. R shoulder IR and ER A/PROM to be at least 45* at 90* ABD           GOAL STATUS: Initial    4. Will be more aware of posture with all functional tasks with use of ergonomic aides PRN/as desired      GOAL STATUS: Initial      LONG TERM GOALS: Target date: 04/22/2024    MMT in surgical shoulder to be at least 4/5 all tested groups  Baseline:  Goal status: INITIAL  2.  Pain to be no more than 2/10 with all functional task performance  Baseline:  Goal status: INITIAL  3.  Will be able to perform all personal care and hygiene without increase in pain or difficulty with surgical shoulder  Baseline:  Goal status: INITIAL  4.  Will be able to perform all home and work based tasks and requirements without increase in pain surgical shoulder  Baseline:  Goal status: INITIAL  5.  PSFS to have improved by at least 4 points  Baseline:  Goal status: INITIAL    PLAN:  PT FREQUENCY:  3x/week for first 2 weeks, then drop to 2x/week after that   PT DURATION: 8 weeks  PLANNED INTERVENTIONS: 97750- Physical Performance Testing, 97110-Therapeutic exercises, 97530- Therapeutic activity, [id number]- Neuromuscular re-education, 97535- Self Care, 16109- Manual therapy, [id number]- Aquatic Therapy, U0454- Electrical stimulation (unattended), 97016- Vasopneumatic device, [id number]- Ultrasound, [id number]- Ionotophoresis 4mg /ml Dexamethasone, Patient/Family education, Taping, Dry Needling, Cryotherapy, and Moist heat  PLAN FOR NEXT SESSION: per MD guidance/precautions. Can start AAROM 4/15, will need HEP updates at that point. No AROM or strength until 03/15/24  Nedra Hai, PT, DPT 02/26/24 3:28 PM

## 2024-02-29 ENCOUNTER — Other Ambulatory Visit: Payer: Self-pay | Admitting: Surgical

## 2024-03-02 ENCOUNTER — Encounter: Payer: Self-pay | Admitting: Physical Therapy

## 2024-03-02 ENCOUNTER — Ambulatory Visit (INDEPENDENT_AMBULATORY_CARE_PROVIDER_SITE_OTHER): Admitting: Physical Therapy

## 2024-03-02 DIAGNOSIS — M6281 Muscle weakness (generalized): Secondary | ICD-10-CM

## 2024-03-02 DIAGNOSIS — M25511 Pain in right shoulder: Secondary | ICD-10-CM

## 2024-03-02 DIAGNOSIS — M25611 Stiffness of right shoulder, not elsewhere classified: Secondary | ICD-10-CM | POA: Diagnosis not present

## 2024-03-02 NOTE — Therapy (Signed)
 OUTPATIENT PHYSICAL THERAPY SHOULDER EVALUATION   Patient Name: Courtney Watson MRN: [id number] DOB:November 05, 1961, 63 y.o., female Today's Date: 03/02/2024  END OF SESSION:  PT End of Session - 03/02/24 1218     Visit Number 2    Number of Visits 19    Date for PT Re-Evaluation 04/22/24    Authorization Type [employer]    Authorization Time Period 02/26/24 to 04/22/24    PT Start Time 1145    PT Stop Time 1227    PT Time Calculation (min) 42 min    Activity Tolerance Patient tolerated treatment well    Behavior During Therapy [facility] for tasks assessed/performed             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   Malignant tumor of colon (HCC) 04/01/2018   Neck pain 09/12/2014   Osteopenia 2018   hips and spine   Palpitations 04/01/2018   Perimenopausal disorder 04/01/2018   Seasonal allergies    Shortness of breath on exertion 06/08/2017   Ulcerative colitis (HCC)    Uterine leiomyoma 04/01/2018   Vitamin D deficiency    Past Surgical History:  Procedure Laterality Date   BICEPT TENODESIS Right 02/02/2024   Procedure: TENODESIS, BICEPS;  Surgeon: Jasmine Mesi, MD;  Location: [facility] OR;  Service: Orthopedics;  Laterality: Right;   COLON SURGERY  2005   colon cancer--Dr.Rosenbower   ENDOMETRIAL ABLATION  2012   Dr.Lomax   KNEE ARTHROSCOPY Right    MYOMECTOMY  2012   Dr.Lomax   POSTERIOR LUMBAR FUSION 2 WITH  HARDWARE REMOVAL Right 02/02/2024   Procedure: ARTHROSCOPY, SHOULDER WITH DEBRIDEMENT;  Surgeon: Jasmine Mesi, MD;  Location: [facility] OR;  Service: Orthopedics;  Laterality: Right;  PROCEDURE:  RIGHT SHOULDER ARTHROSCOPY, DEBRIDEMENT, MINI OPEN BICEPS TENODESIS & ROTATOR CUFF TEAR REPAIR -SUPRASPINATUS AND SUBSCAPULARIS   SHOULDER OPEN ROTATOR CUFF REPAIR Right 02/02/2024   Procedure: REPAIR, ROTATOR CUFF, OPEN;  Surgeon: Jasmine Mesi, MD;  Location: [facility] OR;  Service: Orthopedics;  Laterality: Right;   Patient Active Problem List   Diagnosis Date Noted   Nontraumatic tear of right rotator cuff 02/07/2024   Degenerative superior labral anterior-to-posterior (SLAP) tear of right shoulder 02/07/2024   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   Synovitis of right shoulder 02/07/2024   Biceps tendonitis on right 02/07/2024   Pain in right shoulder 01/08/2024   Inflamed seborrheic keratosis 11/17/2023   Nevus of right shoulder 11/17/2023   Onychoschizia 11/17/2023   Sun-damaged skin 11/17/2023   Encounter for annual physical exam 11/07/2022   Arthritis pain of hand 11/07/2022   High cholesterol 02/19/2022   Essential hypertension 11/21/2021   Seasonal allergies    History of chicken pox    H/O rhinoplasty    Fibroid    Eating disorder  Colon polyps    Blood in stool    Abnormal uterine bleeding    B12 deficiency 04/09/2020   Left thyroid nodule    Upper airway cough syndrome 04/07/2019   Malignant tumor of colon (HCC) 04/01/2018   Perimenopausal disorder 04/01/2018   Uterine leiomyoma 04/01/2018   Palpitations 04/01/2018   Irregular heart beat 04/01/2018   Ulcerative colitis (HCC) 01/25/2018   Vitamin D deficiency 01/25/2018   Fatigue 01/25/2018   Shortness of breath on exertion 06/08/2017   Knee pain, right 08/22/2015   Cervical radiculopathy 01/10/2015   Anxiety 09/12/2014   Osteopenia 09/12/2014   Neck pain 09/12/2014   Malignant neoplasm of large intestine (HCC)  09/12/2014   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   Cancer (HCC) 2005    PCP: Allwardt, Oren Bracket   REFERRING PROVIDER: Coral Ceo  REFERRING DIAG: Please see note for PT instructions  Post right shoulder arthroscopy, RCR, BT on 02/02/2024  THERAPY DIAG:  Acute pain of right shoulder  Stiffness of right shoulder, not elsewhere classified  Muscle weakness (generalized)  Rationale for Evaluation and Treatment: Rehabilitation  ONSET DATE: Surgery 02/02/24   SUBJECTIVE:                                                                                                                                                                                      SUBJECTIVE STATEMENT: She is back at work, trying to come out of sling a little bit per MD recommendations but can not stay out long.   Hand dominance: Right  PERTINENT HISTORY: See above   PAIN:  Pain: 5/10 Pain location: Right shoulder  PRECAUTIONS: Plan is continue with range of motion exercises.  Start physical therapy upstairs to focus on passive and active assisted range of motion of the right shoulder (okay to start active assisted range of motion on 03/01/24).  Okay to start full active range of motion and rotator cuff strengthening exercises on 03/15/2024.  Follow-up in 3 weeks for clinical recheck and determination of return to work potentially.  RED FLAGS: None   WEIGHT BEARING RESTRICTIONS: Yes NWB surgicial shoulder   FALLS:  Has patient fallen in last 6 months? 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  DIAGNOSTIC FINDINGS:   Narrative & Impression  CLINICAL DATA:  Acute right shoulder pain. Limited range of motion. Cracking and popping.   EXAM: MRI OF THE RIGHT SHOULDER WITH CONTRAST   TECHNIQUE: Multiplanar, multisequence MR imaging of the right shoulder was performed following the administration of intra-articular contrast.   CONTRAST:  See Injection Documentation.   COMPARISON:  right shoulder radiographs 01/08/2024   FINDINGS: Rotator cuff: There is abnormal extension of gadolinium contrast into the subacromial/subdeltoid bursa. This appears to be through a full-thickness tear of the anterior supraspinatus tendon footprint measuring up to approximately 9 mm in AP dimension (sagittal series 8, image 8 and with up to only 4 mm tendon retraction (coronal series 5 and 6 images 11 through 13). The infraspinatus is intact. There is high-grade partial thickness tearing of the subscapularis tendon diffusely, with likely a full-thickness component superiorly and only minimal anterior fibers inserting inferiorly (axial images 8 through 15). The teres minor is intact.   Muscles:  Mild to moderate supraspinatus muscle atrophy.   Biceps long head: The origin of the long head of the biceps tendon appears to be intact. The biceps tendon is markedly attenuated just proximal to the bicipital groove, and there appears to be an at least 90% partial-thickness and possible full-thickness tear of the tendon as it courses over the lesser tuberosity into the bicipital groove (axial images 11 through 13). A more normal caliber biceps tendon is seen within the more distal aspect of the bicipital groove (axial images 17-20).   Acromioclavicular Joint: There are mild degenerative changes of the acromioclavicular joint including joint space narrowing, subchondral marrow edema, and peripheral  osteophytosis. Type II acromion.   Glenohumeral Joint: Mild glenohumeral cartilage thinning.   Labrum: There is adequate distention of the glenohumeral joint following arthrogram injection. No glenoid labral tear is seen.   Bones:  No acute fracture.   Other: None.   IMPRESSION: 1. Full-thickness tear of the anterior supraspinatus tendon footprint measuring up to approximately 9 mm in AP dimension and with up to only 4 mm tendon retraction. Mild to moderate supraspinatus muscle atrophy. 2. High-grade partial thickness tearing of the subscapularis tendon diffusely, with likely a full-thickness component superiorly and only minimal intact anterior fibers inserting inferiorly. 3. The long head of the biceps tendon is markedly attenuated just proximal to the bicipital groove, and there appears to be an at least 90% partial-thickness and possible full-thickness tear of the tendon as it courses over the lesser tuberosity into the bicipital groove. 4. Mild degenerative changes of the acromioclavicular joint.    PATIENT SURVEYS:   Patient-Specific Activity Scoring Scheme  "0" represents "unable to perform." "10" represents "able to perform at prior level. 0 1 2 3 4 5 6 7 8 9  10 (Date and Score)   Activity Eval   1. Doing hair/make up  1   2. Cooking   3  3. Getting dressed  4  4. Pick up things  0  5. Brush teeth  4  6.  Write normally  2  Score 2.3   Total score = sum of the activity scores/number of activities Minimum detectable change (90%CI) for average score = 2 points Minimum detectable change (90%CI) for single activity score = 3 points        COGNITION: Overall cognitive status: Within functional limits for tasks assessed     SENSATION: WFL  POSTURE: Rounded shoulders, forward head  UPPER EXTREMITY ROM:   Passive ROM Right eval Left eval  Shoulder flexion Initially 40*, able to get to 90* passive with repeated reps   Shoulder extension     Shoulder scaption  Initially about 30*, able to get to about 75* passive with repeated reps    Shoulder adduction    Shoulder internal rotation Hand to belly at 0* ABD    Shoulder external rotation Initially -30* at 0* ABD, able to get to about -20* passive with repeated reps    Elbow flexion    Elbow extension    Wrist flexion    Wrist extension    Wrist ulnar deviation    Wrist radial deviation    Wrist pronation    Wrist supination    (Blank rows = not tested)  UPPER EXTREMITY MMT:  MMT Right eval Left eval  Shoulder flexion    Shoulder extension    Shoulder abduction    Shoulder adduction    Shoulder internal rotation    Shoulder external rotation    Middle trapezius    Lower trapezius    Elbow flexion    Elbow extension    Wrist flexion    Wrist extension    Wrist ulnar deviation    Wrist radial deviation    Wrist pronation    Wrist supination    Grip strength (lbs)    (Blank rows = not tested)  Did not check MMT at eval due to post-op precautions       PALPATION:  Scar initially restricted but got it moving well with a bit of scar massage, encouraged this at home                                                                                                                              TREATMENT DATE:  03/02/24 Pendulums X 15, circles each direction, x 15 A-P, lateral X 15 Table slide flexion stretch PROM (move body away from arm)5 sec X 10 Table slide abuction stretch PROM (move body away from arm)5 sec X 10 Table slide ER stretch PROM (move body away from arm)5 sec X 10 Supine PROM (hands clasped) flexion X 10  Manual: Right shoulder PROM gentle to tolerance flexion, abduction, ER planes  -Vasopnuematic device X 10 min, low compression, 34 deg to Rt shoulder  02/26/24  Eval, POC  PROM for flexion, scaption and ER   Education on scar massage, education on protection of surgery with progressive motion and precautions to allow for quality soft  tissue healing without complications so that we can push her strength and function without issue later on. Continue with MD given HEP (passive ROM, pendulums, elbow ROM, pulleys) but add grip strength, wrist AROM, finger-thumb opposition, scar massage)   PATIENT EDUCATION: Education details: as above  Person educated: Patient Education method: Programmer, multimedia, Facilities manager, and Handouts Education comprehension: verbalized understanding, returned demonstration, and needs further education  HOME EXERCISE PROGRAM: MD has given her  pulleys and pendulums, PT added the ones below Access Code: [email or web address] URL: [email or web address] Date: 03/02/2024 Prepared by: Ivery Quale  Exercises - Seated Shoulder Flexion Towel Slide at Table Top  - 2-3 x daily - 6 x weekly - 1 sets - 10 reps - 5 sec hold - Seated Shoulder Abduction Towel Slide at Table Top (Mirrored)  - 2-3 x daily - 6 x weekly - 1 sets - 10 reps - 5 sec hold - Seated Shoulder External Rotation PROM on Table (Mirrored)  - 2-3 x daily - 6 x weekly - 1 sets - 10 reps - 5 sec hold - Supine Shoulder Flexion AAROM with Hands Clasped  - 2 x daily - 6 x weekly - 1 sets - 10 reps - 5 sec hold ASSESSMENT:  CLINICAL IMPRESSION: Some pain and guarding upon arrival but this did ease up after session and she had much more PROM observed after manual therapy. Continue with current PT plan per protocol. We will begin more AAROM as pain allows.   OBJECTIVE IMPAIRMENTS: decreased ROM, decreased strength, increased edema, increased fascial restrictions, increased muscle spasms, impaired flexibility, impaired UE functional use, improper body mechanics, postural dysfunction, and pain.   ACTIVITY LIMITATIONS: carrying, lifting, bathing, toileting, dressing, self feeding, reach over head, hygiene/grooming, and caring for others  PARTICIPATION LIMITATIONS: meal prep, cleaning, laundry, driving, shopping, community activity, occupation, and yard  work  PERSONAL FACTORS: Time since onset of injury/illness/exacerbation are also affecting patient's functional outcome.   REHAB POTENTIAL: Excellent  CLINICAL DECISION MAKING: Stable/uncomplicated  EVALUATION COMPLEXITY: Low   GOALS: Goals reviewed with patient? No  SHORT TERM GOALS: Target date:  04/22/2024     Patient will be compliant with appropriate progressive HEP        GOAL STATUS: Initial  2. R shoulder flexion and ABD A/PROM to be at least 150*         GOAL STATUS: Initial    3. R shoulder IR and ER A/PROM to be at least 45* at 90* ABD           GOAL STATUS: Initial    4. Will be more aware of posture with all functional tasks with use of ergonomic aides PRN/as desired      GOAL STATUS: Initial      LONG TERM GOALS: Target date: 04/22/2024    MMT in surgical shoulder to be at least 4/5 all tested groups  Baseline:  Goal status: INITIAL  2.  Pain to be no more than 2/10 with all functional task performance  Baseline:  Goal status: INITIAL  3.  Will be able to perform all personal care and hygiene without increase in pain or difficulty with surgical shoulder  Baseline:  Goal status: INITIAL  4.  Will be able to perform all home and work based tasks and requirements without increase in pain surgical shoulder  Baseline:  Goal status: INITIAL  5.  PSFS to have improved by at least 4 points  Baseline:  Goal status: INITIAL    PLAN:  PT FREQUENCY:  3x/week for first 2 weeks, then drop to 2x/week after that   PT DURATION: 8 weeks  PLANNED INTERVENTIONS: 97750- Physical Performance Testing, 97110-Therapeutic exercises, 97530- Therapeutic activity, [id number]- Neuromuscular re-education, 97535- Self Care, [id number]- Manual therapy, [id number]- Aquatic Therapy, U0454- Electrical stimulation (unattended), 97016- Vasopneumatic device, [id number]- Ultrasound, 09811- Ionotophoresis 4mg /ml Dexamethasone, Patient/Family education, Taping, Dry Needling, Cryotherapy, and Moist  heat  PLAN FOR NEXT SESSION: per MD guidance/precautions. Can start AAROM 4/15,  will need HEP updates at that point. No AROM or strength until 03/15/24  Jamee Mazzoni, PT, DPT 03/02/24 2:27 PM

## 2024-03-07 ENCOUNTER — Encounter: Payer: Self-pay | Admitting: Physical Therapy

## 2024-03-07 ENCOUNTER — Ambulatory Visit: Payer: Self-pay | Admitting: Physical Therapy

## 2024-03-07 DIAGNOSIS — M25611 Stiffness of right shoulder, not elsewhere classified: Secondary | ICD-10-CM | POA: Diagnosis not present

## 2024-03-07 DIAGNOSIS — R293 Abnormal posture: Secondary | ICD-10-CM

## 2024-03-07 DIAGNOSIS — M6281 Muscle weakness (generalized): Secondary | ICD-10-CM | POA: Diagnosis not present

## 2024-03-07 DIAGNOSIS — M25511 Pain in right shoulder: Secondary | ICD-10-CM

## 2024-03-07 NOTE — Therapy (Signed)
 OUTPATIENT PHYSICAL THERAPY SHOULDER TREATMENT   Patient Name: Courtney Watson MRN: [id number] DOB:[birth year], 63 y.o., female Today's Date: 03/07/2024  END OF SESSION:  PT End of Session - 03/07/24 1143     Visit Number 3    Number of Visits 19    Date for PT Re-Evaluation 04/22/24    Authorization Type [employer]    Authorization Time Period 02/26/24 to 04/22/24    PT Start Time 1143    PT Stop Time 1240    PT Time Calculation (min) 57 min    Activity Tolerance Patient tolerated treatment well    Behavior During Therapy [facility] for tasks assessed/performed              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   Malignant tumor of colon (HCC) 04/01/2018   Neck pain 09/12/2014   Osteopenia 2018   hips and spine   Palpitations 04/01/2018   Perimenopausal disorder 04/01/2018   Seasonal allergies    Shortness of breath on exertion 06/08/2017   Ulcerative colitis (HCC)    Uterine leiomyoma 04/01/2018   Vitamin D  deficiency    Past Surgical History:  Procedure Laterality Date   BICEPT TENODESIS Right 02/02/2024   Procedure: TENODESIS, BICEPS;  Surgeon: Jasmine Mesi, MD;  Location: [facility] OR;  Service: Orthopedics;  Laterality: Right;   COLON SURGERY  2005   colon cancer--Dr.Rosenbower   ENDOMETRIAL ABLATION  2012   Dr.Lomax   KNEE ARTHROSCOPY Right    MYOMECTOMY  2012   Dr.Lomax   POSTERIOR LUMBAR FUSION 2 WITH  HARDWARE REMOVAL Right 02/02/2024   Procedure: ARTHROSCOPY, SHOULDER WITH DEBRIDEMENT;  Surgeon: Jasmine Mesi, MD;  Location: [facility] OR;  Service: Orthopedics;  Laterality: Right;  PROCEDURE:  RIGHT SHOULDER ARTHROSCOPY, DEBRIDEMENT, MINI OPEN BICEPS TENODESIS & ROTATOR CUFF TEAR REPAIR -SUPRASPINATUS AND SUBSCAPULARIS   SHOULDER OPEN ROTATOR CUFF REPAIR Right 02/02/2024   Procedure: REPAIR, ROTATOR CUFF, OPEN;  Surgeon: Jasmine Mesi, MD;  Location: [facility] OR;  Service: Orthopedics;  Laterality: Right;   Patient Active Problem List   Diagnosis Date Noted   Nontraumatic tear of right rotator cuff 02/07/2024   Degenerative superior labral anterior-to-posterior (SLAP) tear of right shoulder 02/07/2024   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   Synovitis of right shoulder 02/07/2024   Biceps tendonitis on right 02/07/2024   Pain in right shoulder 01/08/2024   Inflamed seborrheic keratosis 11/17/2023   Nevus of right shoulder 11/17/2023   Onychoschizia 11/17/2023   Sun-damaged skin 11/17/2023   Encounter for annual physical exam 11/07/2022   Arthritis pain of hand 11/07/2022   High cholesterol 02/19/2022   Essential hypertension 11/21/2021   Seasonal allergies    History of chicken pox    H/O rhinoplasty    Fibroid    Eating  disorder    Colon polyps    Blood in stool    Abnormal uterine bleeding    B12 deficiency 04/09/2020   Left thyroid  nodule    Upper airway cough syndrome 04/07/2019   Malignant tumor of colon (HCC) 04/01/2018   Perimenopausal disorder 04/01/2018   Uterine leiomyoma 04/01/2018   Palpitations 04/01/2018   Irregular heart beat 04/01/2018   Ulcerative colitis (HCC) 01/25/2018   Vitamin D  deficiency 01/25/2018   Fatigue 01/25/2018   Shortness of breath on exertion 06/08/2017   Knee pain, right 08/22/2015   Cervical radiculopathy 01/10/2015   Anxiety 09/12/2014   Osteopenia 09/12/2014   Neck pain 09/12/2014   Malignant neoplasm of large intestine (HCC)  09/12/2014   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   Cancer (HCC) 2005    PCP: Allwardt, Alyssa PA-C   REFERRING PROVIDER: Jean Michaelis  REFERRING DIAG: Please see note for PT instructions  Post right shoulder arthroscopy, RCR, BT on 02/02/2024  THERAPY DIAG:  Acute pain of right shoulder  Stiffness of right shoulder, not elsewhere classified  Muscle weakness (generalized)  Abnormal posture  Rationale for Evaluation and Treatment: Rehabilitation  ONSET DATE: Surgery 02/02/24   SUBJECTIVE:                                                                                                                                                                                      SUBJECTIVE STATEMENT: She has done desk work 3 days of return to work.  Moving the computer mouse to right is an issue.   Hand dominance: Right  PERTINENT HISTORY: Right shoulder arthroscopy RTC repair suprapspinatus & subscapularis, biceps tenodesis 02/02/24, Colon CA 2005 with sg, Brachial neuritis, cervical radiculopathy, HTN  PAIN:  Yes: NPRS scale:  at rest 1/10 up to 3/10  Pain location: right shoulder more anterior and biceps  Pain description: dull ache Aggravating factors:  guarding, moving arm certain ways Relieving factors: ice, support, tylenol  prn  PRECAUTIONS: Plan is continue with range of motion exercises.  Start physical therapy upstairs to focus on passive and active assisted range of motion of the right shoulder (okay to start active assisted range of motion on 03/01/24).  Okay to start full active range of motion and rotator cuff strengthening exercises on 03/15/2024.  Follow-up in 3 weeks for clinical recheck and determination of return to work potentially.  RED FLAGS: None   WEIGHT BEARING RESTRICTIONS: Yes NWB surgicial shoulder   FALLS:  Has patient fallen in last 6 months? 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  DIAGNOSTIC FINDINGS:   Narrative & Impression  CLINICAL DATA:  Acute right shoulder pain. Limited range of motion. Cracking and popping.   EXAM: MRI OF THE RIGHT SHOULDER WITH CONTRAST   TECHNIQUE: Multiplanar, multisequence MR imaging of the right shoulder was performed following the administration of intra-articular contrast.   CONTRAST:  See Injection Documentation.   COMPARISON:  right shoulder radiographs 01/08/2024   FINDINGS: Rotator cuff: There is abnormal extension of gadolinium contrast into the subacromial/subdeltoid bursa. This appears to be through a full-thickness tear of the anterior supraspinatus tendon footprint measuring up to approximately 9 mm in AP dimension (sagittal series 8, image 8 and with up to only 4 mm tendon retraction (coronal series 5 and 6 images 11 through 13). The infraspinatus is intact. There is high-grade partial thickness tearing of the subscapularis tendon diffusely, with likely a full-thickness component superiorly and only minimal anterior fibers inserting inferiorly (axial images 8 through 15). The teres minor is intact.   Muscles:  Mild to moderate supraspinatus muscle atrophy.   Biceps long head: The origin of the long head of the biceps tendon appears to be intact. The biceps tendon is markedly attenuated just proximal to the bicipital groove, and there appears to be an at least 90% partial-thickness and possible full-thickness tear of the tendon as it courses over the lesser tuberosity into  the bicipital groove (axial images 11 through 13). A more normal caliber biceps tendon is seen within the more distal aspect of the bicipital groove (axial images 17-20).   Acromioclavicular Joint: There are mild degenerative changes of the acromioclavicular joint including joint space narrowing, subchondral marrow edema, and peripheral osteophytosis. Type II acromion.   Glenohumeral Joint: Mild glenohumeral cartilage thinning.   Labrum: There is adequate distention of the glenohumeral joint following arthrogram injection. No glenoid labral tear is seen.   Bones:  No acute fracture.   Other: None.   IMPRESSION: 1. Full-thickness tear of the anterior supraspinatus tendon footprint measuring up to approximately 9 mm in AP dimension and with up to only 4 mm tendon retraction. Mild to moderate supraspinatus muscle atrophy. 2. High-grade partial thickness tearing of the subscapularis tendon diffusely, with likely a full-thickness component superiorly and only minimal intact anterior fibers inserting inferiorly. 3. The long head of the biceps tendon is markedly attenuated just proximal to the bicipital groove, and there appears to be an at least 90% partial-thickness and possible full-thickness tear of the tendon as it courses over the lesser tuberosity into the bicipital groove. 4. Mild degenerative changes of the acromioclavicular joint.    PATIENT SURVEYS:   Patient-Specific Activity Scoring Scheme  "0" represents "unable to perform." "10" represents "able to perform at prior level. 0 1 2 3 4 5 6 7 8 9  10 (Date and Score)   Activity Eval   1. Doing hair/make up  1   2. Cooking   3  3. Getting dressed  4  4. Pick up things  0  5. Brush teeth  4  6.  Write normally  2  Score 2.3   Total score = sum of the activity scores/number of  activities Minimum detectable change (90%CI) for average score = 2 points Minimum detectable change (90%CI) for single activity score = 3  points  COGNITION: Overall cognitive status: Within functional limits for tasks assessed     SENSATION: WFL  POSTURE: Rounded shoulders, forward head   UPPER EXTREMITY ROM:   Passive ROM Right eval Right 03/07/24  Shoulder flexion Initially 40*, able to get to 90* passive with repeated reps P: 98*  Shoulder extension    Shoulder scaption  Initially about 30*, able to get to about 75* passive with repeated reps  P: 81*  Shoulder adduction    Shoulder internal rotation Hand to belly at 0* ABD    Shoulder external rotation Initially -30* at 0* ABD, able to get to about -20* passive with repeated reps  P: 38* at 45* abduction  Elbow flexion    Elbow extension    Wrist flexion    Wrist extension    Wrist ulnar deviation    Wrist radial deviation    Wrist pronation    Wrist supination    (Blank rows = not tested)  UPPER EXTREMITY MMT:  MMT Right eval Left eval  Shoulder flexion    Shoulder extension    Shoulder abduction    Shoulder adduction    Shoulder internal rotation    Shoulder external rotation    Middle trapezius    Lower trapezius    Elbow flexion    Elbow extension    Wrist flexion    Wrist extension    Wrist ulnar deviation    Wrist radial deviation    Wrist pronation    Wrist supination    Grip strength (lbs)    (Blank rows = not tested)  Did not check MMT at eval due to post-op precautions    PALPATION:  Scar initially restricted but got it moving well with a bit of scar massage, encouraged this at home                                                                                                                              TREATMENT DATE:  03/07/2024 Therapeutic Exercise: Pulley AAROM flexion & scapation 2 min ea with PT demo & verbal cues on motion.   RUE Supine chest press AAROM with dowel 10 reps 2 sets RUE AAROM shoulder flexion with dowel 10 reps 2 sets RUE AAROM shoulder abduction with dowel 10 reps 2 sets RUE AAROM shoulder external  rotation with dowel 10 reps 2 sets PT demo & verbal cues on pendulum exercises. Pt verbalized better understanding.  Supine RUE elbow flexion & extension with 10 reps ea with forearm neutral, pronation & supination.   Self-Care: PT verbal & demo cues on positioning RUE when seated to support weight of arm. Pt verbalized understanding & reports felt better.  PT recommending to decrease use of sling unless in crowd. Pt verbalized understanding.   PT verbal & demo cues on positioning in  supine with pillow under head & neck not shoulder.   Manual Therapy: PROM right shoulder flexion, abduction and external rotation.   PROM biceps stretch with UE beside trunk & ~90* flexion.   -Vasopnuematic device X 10 min, medium compression, 34 deg to Rt shoulder  TREATMENT DATE:  03/02/24 Pendulums X 15, circles each direction, x 15 A-P, lateral X 15 Table slide flexion stretch PROM (move body away from arm)5 sec X 10 Table slide abuction stretch PROM (move body away from arm)5 sec X 10 Table slide ER stretch PROM (move body away from arm)5 sec X 10 Supine PROM (hands clasped) flexion X 10  Manual: Right shoulder PROM gentle to tolerance flexion, abduction, ER planes  -Vasopnuematic device X 10 min, low compression, 34 deg to Rt shoulder    02/26/24  Eval, POC  PROM for flexion, scaption and ER   Education on scar massage, education on protection of surgery with progressive motion and precautions to allow for quality soft tissue healing without complications so that we can push her strength and function without issue later on. Continue with MD given HEP (passive ROM, pendulums, elbow ROM, pulleys) but add grip strength, wrist AROM, finger-thumb opposition, scar massage)   PATIENT EDUCATION: Education details: as above  Person educated: Patient Education method: Programmer, multimedia, Facilities manager, and Handouts Education comprehension: verbalized understanding, returned demonstration, and needs further  education  HOME EXERCISE PROGRAM: MD has given her pulleys and pendulums, PT added the ones below Access Code: [email or web address] URL: [email or web address] Date: 03/07/2024 Prepared by: Lorie Rook  Exercises - Seated Shoulder Flexion Towel Slide at Table Top  - 2-3 x daily - 6 x weekly - 1 sets - 10 reps - 5 sec hold - Seated Shoulder Abduction Towel Slide at Table Top (Mirrored)  - 2-3 x daily - 6 x weekly - 1 sets - 10 reps - 5 sec hold - Seated Shoulder External Rotation PROM on Table (Mirrored)  - 2-3 x daily - 6 x weekly - 1 sets - 10 reps - 5 sec hold - Supine Shoulder Flexion AAROM with Hands Clasped  - 2 x daily - 6 x weekly - 1 sets - 10 reps - 5 sec hold - Seated Shoulder Flexion AAROM with Pulley Behind  - 1-2 x daily - 7 x weekly - 1 sets - 1 reps - 2 minutes hold - Seated Shoulder Scaption AAROM with Pulley at Side  - 1-2 x daily - 7 x weekly - 1 sets - 1 reps - 2 minutes hold - Supine Shoulder Press AAROM in Abduction with Dowel  - 1-2 x daily - 7 x weekly - 1 sets - 10 reps - 5 seconds hold - Supine Shoulder Flexion Extension AAROM with Dowel  - 1-2 x daily - 7 x weekly - 1 sets - 10 reps - 5 seconds hold - Supine Shoulder Abduction AAROM with Dowel  - 1-2 x daily - 7 x weekly - 1 sets - 10 reps - 5 seconds hold - Supine Shoulder External Rotation in 45 Degrees Abduction AAROM with Dowel  - 1-2 x daily - 7 x weekly - 1 sets - 10 reps - 5 seconds hold   ASSESSMENT:  CLINICAL IMPRESSION: Patient is fearful of moving her shoulder which has increased guarding and thereby pain.  Patient reports better understanding of use of pulleys and pendulum after PT instruction.  PT added dowel AAROM supine to HEP which patient appears to understand.  She appears to understand PT recommendations for  positioning upper extremity to decrease guarding pain.  Patient continues to benefit from skilled PT.  OBJECTIVE IMPAIRMENTS: decreased ROM, decreased strength, increased edema, increased  fascial restrictions, increased muscle spasms, impaired flexibility, impaired UE functional use, improper body mechanics, postural dysfunction, and pain.   ACTIVITY LIMITATIONS: carrying, lifting, bathing, toileting, dressing, self feeding, reach over head, hygiene/grooming, and caring for others  PARTICIPATION LIMITATIONS: meal prep, cleaning, laundry, driving, shopping, community activity, occupation, and yard work  PERSONAL FACTORS: Time since onset of injury/illness/exacerbation are also affecting patient's functional outcome.   REHAB POTENTIAL: Excellent  CLINICAL DECISION MAKING: Stable/uncomplicated  EVALUATION COMPLEXITY: Low   GOALS: Goals reviewed with patient? No  SHORT TERM GOALS: Target date:  03/25/2024     Patient will be compliant with appropriate progressive HEP        GOAL STATUS: Ongoing  03/07/2024  2. R shoulder flexion and ABD A/PROM to be at least 150*         GOAL STATUS: Ongoing  03/07/2024    3. R shoulder IR and ER A/PROM to be at least 45* at 90* ABD           GOAL STATUS: Ongoing  03/07/2024   4. Will be more aware of posture with all functional tasks with use of ergonomic aides PRN/as desired      GOAL STATUS: Ongoing  03/07/2024     LONG TERM GOALS: Target date: 04/22/2024    MMT in surgical shoulder to be at least 4/5 all tested groups  Baseline:  Goal status: Ongoing  03/07/2024  2.  Pain to be no more than 2/10 with all functional task performance  Baseline:  Goal status: Ongoing  03/07/2024  3.  Will be able to perform all personal care and hygiene without increase in pain or difficulty with surgical shoulder  Baseline:  Goal status: Ongoing  03/07/2024  4.  Will be able to perform all home and work based tasks and requirements without increase in pain surgical shoulder  Baseline:  Goal status: Ongoing  03/07/2024  5.  PSFS to have improved by at least 4 points  Baseline:  Goal status:  Ongoing  03/07/2024    PLAN:  PT FREQUENCY:   3x/week for first 2 weeks, then drop to 2x/week after that   PT DURATION: 8 weeks  PLANNED INTERVENTIONS: 97750- Physical Performance Testing, 97110-Therapeutic exercises, 97530- Therapeutic activity, [id number]- Neuromuscular re-education, 97535- Self Care, [id number]- Manual therapy, [id number]- Aquatic Therapy, [id number]- Electrical stimulation (unattended), [id number]- Vasopneumatic device, [id number]- Ultrasound, 82956- Ionotophoresis 4mg /ml Dexamethasone , Patient/Family education, Taping, Dry Needling, Cryotherapy, and Moist heat  PLAN FOR NEXT SESSION: send progress note prior to MD appt,  per MD guidance/precautions. AAROM started 4/21, will need HEP updates at that point. No AROM or strength until 03/15/24     Lorie Rook, PT, DPT 03/07/2024, 3:53 PM

## 2024-03-09 ENCOUNTER — Ambulatory Visit: Admitting: Physical Therapy

## 2024-03-09 ENCOUNTER — Encounter: Payer: Self-pay | Admitting: Physical Therapy

## 2024-03-09 DIAGNOSIS — R293 Abnormal posture: Secondary | ICD-10-CM

## 2024-03-09 DIAGNOSIS — M6281 Muscle weakness (generalized): Secondary | ICD-10-CM

## 2024-03-09 DIAGNOSIS — M25611 Stiffness of right shoulder, not elsewhere classified: Secondary | ICD-10-CM | POA: Diagnosis not present

## 2024-03-09 DIAGNOSIS — M25511 Pain in right shoulder: Secondary | ICD-10-CM

## 2024-03-09 NOTE — Therapy (Signed)
 OUTPATIENT PHYSICAL THERAPY SHOULDER TREATMENT   Patient Name: Courtney Watson MRN: [id number] DOB:Jun 24, 1961, 63 y.o., female Today's Date: 03/09/2024  END OF SESSION:  PT End of Session - 03/09/24 1344     Visit Number 4    Number of Visits 19    Date for PT Re-Evaluation 04/22/24    Authorization Type [employer]    Authorization Time Period 02/26/24 to 04/22/24    PT Start Time 1345    PT Stop Time 1440    PT Time Calculation (min) 55 min    Activity Tolerance Patient tolerated treatment well    Behavior During Therapy [facility] for tasks assessed/performed               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   Malignant tumor of colon (HCC) 04/01/2018   Neck pain 09/12/2014   Osteopenia 2018   hips and spine   Palpitations 04/01/2018   Perimenopausal disorder 04/01/2018   Seasonal allergies    Shortness of breath on exertion 06/08/2017   Ulcerative colitis (HCC)    Uterine leiomyoma 04/01/2018   Vitamin D  deficiency    Past Surgical History:  Procedure Laterality Date   BICEPT TENODESIS Right 02/02/2024   Procedure: TENODESIS, BICEPS;  Surgeon: Jasmine Mesi, MD;  Location: [facility] OR;  Service: Orthopedics;  Laterality: Right;   COLON SURGERY  2005   colon cancer--Dr.Rosenbower   ENDOMETRIAL ABLATION  2012   Dr.Lomax   KNEE ARTHROSCOPY Right    MYOMECTOMY  2012   Dr.Lomax   POSTERIOR LUMBAR FUSION 2 WITH  HARDWARE REMOVAL Right 02/02/2024   Procedure: ARTHROSCOPY, SHOULDER WITH DEBRIDEMENT;  Surgeon: Jasmine Mesi, MD;  Location: [facility] OR;  Service: Orthopedics;  Laterality: Right;  PROCEDURE:  RIGHT SHOULDER ARTHROSCOPY, DEBRIDEMENT, MINI OPEN BICEPS TENODESIS & ROTATOR CUFF TEAR REPAIR -SUPRASPINATUS AND SUBSCAPULARIS   SHOULDER OPEN ROTATOR CUFF REPAIR Right 02/02/2024   Procedure: REPAIR, ROTATOR CUFF, OPEN;  Surgeon: Jasmine Mesi, MD;  Location: [facility] OR;  Service: Orthopedics;  Laterality: Right;   Patient Active Problem List   Diagnosis Date Noted   Nontraumatic tear of right rotator cuff 02/07/2024   Degenerative superior labral anterior-to-posterior (SLAP) tear of right shoulder 02/07/2024   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   Synovitis of right shoulder 02/07/2024   Biceps tendonitis on right 02/07/2024   Pain in right shoulder 01/08/2024   Inflamed seborrheic keratosis 11/17/2023   Nevus of right shoulder 11/17/2023   Onychoschizia 11/17/2023   Sun-damaged skin 11/17/2023   Encounter for annual physical exam 11/07/2022   Arthritis pain of hand 11/07/2022   High cholesterol 02/19/2022   Essential hypertension 11/21/2021   Seasonal allergies    History of chicken pox    H/O rhinoplasty    Fibroid  Eating disorder    Colon polyps    Blood in stool    Abnormal uterine bleeding    B12 deficiency 04/09/2020   Left thyroid  nodule    Upper airway cough syndrome 04/07/2019   Malignant tumor of colon (HCC) 04/01/2018   Perimenopausal disorder 04/01/2018   Uterine leiomyoma 04/01/2018   Palpitations 04/01/2018   Irregular heart beat 04/01/2018   Ulcerative colitis (HCC) 01/25/2018   Vitamin D  deficiency 01/25/2018   Fatigue 01/25/2018   Shortness of breath on exertion 06/08/2017   Knee pain, right 08/22/2015   Cervical radiculopathy 01/10/2015   Anxiety 09/12/2014   Osteopenia 09/12/2014   Neck pain 09/12/2014   Malignant neoplasm of large intestine (HCC)  09/12/2014   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   Cancer (HCC) 2005    PCP: Allwardt, Alyssa PA-C   REFERRING PROVIDER: Jean Michaelis  REFERRING DIAG: Please see note for PT instructions  Post right shoulder arthroscopy, RCR, BT on 02/02/2024  THERAPY DIAG:  Acute pain of right shoulder  Stiffness of right shoulder, not elsewhere classified  Muscle weakness (generalized)  Abnormal posture  Rationale for Evaluation and Treatment: Rehabilitation  ONSET DATE: Surgery 02/02/24   SUBJECTIVE:                                                                                                                                                                                      SUBJECTIVE STATEMENT: She could tell she did more with last PT session but pain was not unbearable.    Hand dominance: Right  PERTINENT HISTORY: Right shoulder arthroscopy RTC repair suprapspinatus & subscapularis, biceps tenodesis 02/02/24, Colon CA 2005 with sg, Brachial neuritis, cervical radiculopathy, HTN  PAIN:  Yes: NPRS scale:  at rest 1/10 and with motion up to 3/10  Pain location: right shoulder more anterior and biceps  Pain description: dull ache Aggravating factors:  guarding, moving arm certain ways Relieving factors: ice, support, tylenol  prn  PRECAUTIONS: Plan is continue with range of motion exercises.  Start physical therapy upstairs to focus on passive and active assisted range of motion of the right shoulder (okay to start active assisted range of motion on 03/01/24).  Okay to start full active range of motion and rotator cuff strengthening exercises on 03/15/2024.  Follow-up in 3 weeks for clinical recheck and determination of return to work potentially.  RED FLAGS: None   WEIGHT BEARING RESTRICTIONS: Yes NWB surgicial shoulder   FALLS:  Has patient fallen in last 6 months? 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  DIAGNOSTIC FINDINGS:   Narrative & Impression  CLINICAL DATA:  Acute right shoulder pain. Limited range of motion. Cracking and popping.   EXAM: MRI OF THE RIGHT SHOULDER WITH CONTRAST   TECHNIQUE: Multiplanar, multisequence MR imaging of the right shoulder was performed following the administration of intra-articular contrast.   CONTRAST:  See Injection Documentation.   COMPARISON:  right shoulder radiographs 01/08/2024   FINDINGS: Rotator cuff: There is abnormal extension of gadolinium contrast into the subacromial/subdeltoid bursa. This appears to be through a full-thickness tear of the anterior supraspinatus tendon footprint measuring up to approximately 9 mm in AP dimension (sagittal series 8, image 8 and with up to only 4 mm tendon retraction (coronal series 5 and 6 images 11 through 13). The infraspinatus is intact. There is high-grade partial thickness tearing of the subscapularis tendon diffusely, with likely a full-thickness component superiorly and only minimal anterior fibers inserting inferiorly (axial images 8 through 15). The teres minor is intact.   Muscles:  Mild to moderate supraspinatus muscle atrophy.   Biceps long head: The origin of the long head of the biceps tendon appears to be intact. The biceps tendon is markedly attenuated just proximal to the bicipital groove, and there appears to be an at least 90% partial-thickness and possible full-thickness tear of the tendon as it courses over the lesser tuberosity into  the bicipital groove (axial images 11 through 13). A more normal caliber biceps tendon is seen within the more distal aspect of the bicipital groove (axial images 17-20).   Acromioclavicular Joint: There are mild degenerative changes of the acromioclavicular joint including joint space narrowing, subchondral marrow edema, and peripheral osteophytosis. Type II acromion.   Glenohumeral Joint: Mild glenohumeral cartilage thinning.   Labrum: There is adequate distention of the glenohumeral joint following arthrogram injection. No glenoid labral tear is seen.   Bones:  No acute fracture.   Other: None.   IMPRESSION: 1. Full-thickness tear of the anterior supraspinatus tendon footprint measuring up to approximately 9 mm in AP dimension and with up to only 4 mm tendon retraction. Mild to moderate supraspinatus muscle atrophy. 2. High-grade partial thickness tearing of the subscapularis tendon diffusely, with likely a full-thickness component superiorly and only minimal intact anterior fibers inserting inferiorly. 3. The long head of the biceps tendon is markedly attenuated just proximal to the bicipital groove, and there appears to be an at least 90% partial-thickness and possible full-thickness tear of the tendon as it courses over the lesser tuberosity into the bicipital groove. 4. Mild degenerative changes of the acromioclavicular joint.    PATIENT SURVEYS:   Patient-Specific Activity Scoring Scheme  "0" represents "unable to perform." "10" represents "able to perform at prior level. 0 1 2 3 4 5 6 7 8 9  10 (Date and Score)   Activity Eval   1. Doing hair/make up  1   2. Cooking   3  3. Getting dressed  4  4. Pick up things  0  5. Brush teeth  4  6.  Write normally  2  Score 2.3   Total score = sum of the activity scores/number of activities Minimum detectable  change (90%CI) for average score = 2 points Minimum detectable change (90%CI) for single activity score = 3  points  COGNITION: Overall cognitive status: Within functional limits for tasks assessed     SENSATION: WFL  POSTURE: Rounded shoulders, forward head   UPPER EXTREMITY ROM:   Passive ROM Right eval Right 03/07/24  Shoulder flexion Initially 40*, able to get to 90* passive with repeated reps P: 98*  Shoulder extension    Shoulder scaption  Initially about 30*, able to get to about 75* passive with repeated reps  P: 81*  Shoulder adduction    Shoulder internal rotation Hand to belly at 0* ABD    Shoulder external rotation Initially -30* at 0* ABD, able to get to about -20* passive with repeated reps  P: 38* at 45* abduction  Elbow flexion    Elbow extension    Wrist flexion    Wrist extension    Wrist ulnar deviation    Wrist radial deviation    Wrist pronation    Wrist supination    (Blank rows = not tested)  UPPER EXTREMITY MMT:  MMT Right eval Left eval  Shoulder flexion    Shoulder extension    Shoulder abduction    Shoulder adduction    Shoulder internal rotation    Shoulder external rotation    Middle trapezius    Lower trapezius    Elbow flexion    Elbow extension    Wrist flexion    Wrist extension    Wrist ulnar deviation    Wrist radial deviation    Wrist pronation    Wrist supination    Grip strength (lbs)    (Blank rows = not tested)  Did not check MMT at eval due to post-op precautions    PALPATION:  Scar initially restricted but got it moving well with a bit of scar massage, encouraged this at home                                                                                                                              TREATMENT DATE:  03/09/2024 Therapeutic Exercise: Pt warm-up with pendulum with improved motion.   Table top slides with arm inside pillow case: flexion & scaption 10 reps 5 sec hold Supine dowel chest press Dowel exercises 5 sec hold for gentle end range stretch supine flexion 10 reps; scaption 10 reps; external rotation  10 reps Standing dowel shoulder ext 5 sec hold 10 reps AA (PT manually assisting) standing shoulder flexion with UE ranger flexion 10 reps Pulley AAROM flexion & scapation 2 min ea with PT demo & verbal cues on motion.  Patient had improved range in both planes today.  Manual Therapy: PROM right shoulder flexion, abduction and external rotation.     -Vasopnuematic device X 10 min, medium compression, 34 deg to Rt shoulder  TREATMENT DATE:  03/07/2024 Therapeutic Exercise: Pulley AAROM flexion & scapation 2 min ea with PT  demo & verbal cues on motion.   RUE Supine chest press AAROM with dowel 10 reps 2 sets RUE AAROM shoulder flexion with dowel 10 reps 2 sets RUE AAROM shoulder abduction with dowel 10 reps 2 sets RUE AAROM shoulder external rotation with dowel 10 reps 2 sets PT demo & verbal cues on pendulum exercises. Pt verbalized better understanding.  Supine RUE elbow flexion & extension with 10 reps ea with forearm neutral, pronation & supination.   Self-Care: PT verbal & demo cues on positioning RUE when seated to support weight of arm. Pt verbalized understanding & reports felt better.  PT recommending to decrease use of sling unless in crowd. Pt verbalized understanding.   PT verbal & demo cues on positioning in supine with pillow under head & neck not shoulder.   Manual Therapy: PROM right shoulder flexion, abduction and external rotation.   PROM biceps stretch with UE beside trunk & ~90* flexion.   -Vasopnuematic device X 10 min, medium compression, 34 deg to Rt shoulder  TREATMENT DATE:  03/02/24 Pendulums X 15, circles each direction, x 15 A-P, lateral X 15 Table slide flexion stretch PROM (move body away from arm)5 sec X 10 Table slide abuction stretch PROM (move body away from arm)5 sec X 10 Table slide ER stretch PROM (move body away from arm)5 sec X 10 Supine PROM (hands clasped) flexion X 10  Manual: Right shoulder PROM gentle to tolerance flexion, abduction, ER  planes  -Vasopnuematic device X 10 min, low compression, 34 deg to Rt shoulder    02/26/24  Eval, POC  PROM for flexion, scaption and ER   Education on scar massage, education on protection of surgery with progressive motion and precautions to allow for quality soft tissue healing without complications so that we can push her strength and function without issue later on. Continue with MD given HEP (passive ROM, pendulums, elbow ROM, pulleys) but add grip strength, wrist AROM, finger-thumb opposition, scar massage)   PATIENT EDUCATION: Education details: as above  Person educated: Patient Education method: Programmer, multimedia, Facilities manager, and Handouts Education comprehension: verbalized understanding, returned demonstration, and needs further education  HOME EXERCISE PROGRAM: MD has given her pulleys and pendulums, PT added the ones below Access Code: [email or web address] URL: [email or web address] Date: 03/07/2024 Prepared by: Lorie Rook  Exercises - Seated Shoulder Flexion Towel Slide at Table Top  - 2-3 x daily - 6 x weekly - 1 sets - 10 reps - 5 sec hold - Seated Shoulder Abduction Towel Slide at Table Top (Mirrored)  - 2-3 x daily - 6 x weekly - 1 sets - 10 reps - 5 sec hold - Seated Shoulder External Rotation PROM on Table (Mirrored)  - 2-3 x daily - 6 x weekly - 1 sets - 10 reps - 5 sec hold - Supine Shoulder Flexion AAROM with Hands Clasped  - 2 x daily - 6 x weekly - 1 sets - 10 reps - 5 sec hold - Seated Shoulder Flexion AAROM with Pulley Behind  - 1-2 x daily - 7 x weekly - 1 sets - 1 reps - 2 minutes hold - Seated Shoulder Scaption AAROM with Pulley at Side  - 1-2 x daily - 7 x weekly - 1 sets - 1 reps - 2 minutes hold - Supine Shoulder Press AAROM in Abduction with Dowel  - 1-2 x daily - 7 x weekly - 1 sets - 10 reps - 5 seconds hold - Supine Shoulder Flexion Extension AAROM with Dowel  - 1-2  x daily - 7 x weekly - 1 sets - 10 reps - 5 seconds hold - Supine Shoulder  Abduction AAROM with Dowel  - 1-2 x daily - 7 x weekly - 1 sets - 10 reps - 5 seconds hold - Supine Shoulder External Rotation in 45 Degrees Abduction AAROM with Dowel  - 1-2 x daily - 7 x weekly - 1 sets - 10 reps - 5 seconds hold   ASSESSMENT:  CLINICAL IMPRESSION: Patient is reporting less pain with standing, sitting & walking.  She reports more comfortable moving without sling protection.  Pt had improved range with AA type exercises today.  patient continues to benefit from skilled PT.  OBJECTIVE IMPAIRMENTS: decreased ROM, decreased strength, increased edema, increased fascial restrictions, increased muscle spasms, impaired flexibility, impaired UE functional use, improper body mechanics, postural dysfunction, and pain.   ACTIVITY LIMITATIONS: carrying, lifting, bathing, toileting, dressing, self feeding, reach over head, hygiene/grooming, and caring for others  PARTICIPATION LIMITATIONS: meal prep, cleaning, laundry, driving, shopping, community activity, occupation, and yard work  PERSONAL FACTORS: Time since onset of injury/illness/exacerbation are also affecting patient's functional outcome.   REHAB POTENTIAL: Excellent  CLINICAL DECISION MAKING: Stable/uncomplicated  EVALUATION COMPLEXITY: Low   GOALS: Goals reviewed with patient? No  SHORT TERM GOALS: Target date:  03/25/2024     Patient will be compliant with appropriate progressive HEP        GOAL STATUS: Ongoing  03/07/2024  2. R shoulder flexion and ABD A/PROM to be at least 150*         GOAL STATUS: Ongoing  03/07/2024    3. R shoulder IR and ER A/PROM to be at least 45* at 90* ABD           GOAL STATUS: Ongoing  03/07/2024   4. Will be more aware of posture with all functional tasks with use of ergonomic aides PRN/as desired      GOAL STATUS: Ongoing  03/07/2024     LONG TERM GOALS: Target date: 04/22/2024    MMT in surgical shoulder to be at least 4/5 all tested groups  Baseline:  Goal status: Ongoing   03/07/2024  2.  Pain to be no more than 2/10 with all functional task performance  Baseline:  Goal status: Ongoing  03/07/2024  3.  Will be able to perform all personal care and hygiene without increase in pain or difficulty with surgical shoulder  Baseline:  Goal status: Ongoing  03/07/2024  4.  Will be able to perform all home and work based tasks and requirements without increase in pain surgical shoulder  Baseline:  Goal status: Ongoing  03/07/2024  5.  PSFS to have improved by at least 4 points  Baseline:  Goal status:  Ongoing  03/07/2024    PLAN:  PT FREQUENCY:  3x/week for first 2 weeks, then drop to 2x/week after that   PT DURATION: 8 weeks  PLANNED INTERVENTIONS: 97750- Physical Performance Testing, 97110-Therapeutic exercises, 97530- Therapeutic activity, [id number]- Neuromuscular re-education, 97535- Self Care, [id number]- Manual therapy, [id number]- Aquatic Therapy, [id number]- Electrical stimulation (unattended), [id number]- Vasopneumatic device, [id number]- Ultrasound, 82956- Ionotophoresis 4mg /ml Dexamethasone , Patient/Family education, Taping, Dry Needling, Cryotherapy, and Moist heat  PLAN FOR NEXT SESSION: check MD note, continue with AAROM exercises,  per MD guidance/precautions. No AROM or strength until 03/15/24     Lorie Rook, PT, DPT 03/09/2024, 4:18 PM

## 2024-03-16 ENCOUNTER — Ambulatory Visit (INDEPENDENT_AMBULATORY_CARE_PROVIDER_SITE_OTHER): Admitting: Surgical

## 2024-03-16 ENCOUNTER — Encounter: Payer: Self-pay | Admitting: Surgical

## 2024-03-16 DIAGNOSIS — Z9889 Other specified postprocedural states: Secondary | ICD-10-CM

## 2024-03-16 NOTE — Progress Notes (Signed)
 Post-Op Visit Note   Patient: Courtney Watson           Date of Birth: 03/15/1961           MRN: [id number] Visit Date: 03/16/2024 PCP: Alda Amas, PA-C   Assessment & Plan:  Chief Complaint:  Chief Complaint  Patient presents with   Right Shoulder - Routine Post Op, Follow-up    02/02/2024 right shoulder arthroscopy, BT, RCR   Visit Diagnoses:  1. S/P rotator cuff repair     Plan: Patient is a 63 year old female who presents s/p right shoulder arthroscopy with bicep tenodesis and rotator cuff tear repair on 02/02/2024.  Doing well overall.  Has had some increased achiness today but aside from today, she feels that she has been progressively improving as she has had more and more physical therapy sessions.  She has had 4 sessions so far.  Working with a pulley and doing some more active range of motion exercises yet.  Has not started strengthening yet.  She has been back to work for 3 weeks and feels this is going well.  Only takes occasional ibuprofen for pain control.  On exam, patient has 40 degrees external rotation, 70 degrees abduction, 115 degrees forward elevation passively with about 80 degrees of active forward elevation.  Incisions are well-healed.  Axillary nerve intact with deltoid firing.  She has 5 -/5 supra/infra strength with 5/5 subscap strength.  Palpable radial pulse of the operative extremity.  Plan is to continue with physical therapy.  Her strength is in a good spot for where she is at, considering she has not started any strengthening exercises yet.  She should be okay to start rotator cuff strengthening exercises now.  Continue with active motion.  Follow-up in 6 weeks for clinical recheck.  Follow-Up Instructions: No follow-ups on file.   Orders:  No orders of the defined types were placed in this encounter.  No orders of the defined types were placed in this encounter.   Imaging: No results found.  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   Synovitis of right shoulder 02/07/2024   Biceps tendonitis on right 02/07/2024   Pain in right shoulder 01/08/2024   Inflamed seborrheic keratosis 11/17/2023   Nevus of right shoulder 11/17/2023   Onychoschizia 11/17/2023   Sun-damaged skin 11/17/2023   Encounter for annual physical exam 11/07/2022   Arthritis pain of hand 11/07/2022   High cholesterol 02/19/2022   Essential hypertension 11/21/2021   Seasonal allergies    History of chicken pox    H/O rhinoplasty    Fibroid    Eating disorder    Colon polyps    Blood in stool    Abnormal uterine bleeding    B12 deficiency 04/09/2020   Left thyroid  nodule    Upper airway cough syndrome 04/07/2019   Malignant tumor of colon (HCC) 04/01/2018   Perimenopausal disorder 04/01/2018   Uterine leiomyoma 04/01/2018   Palpitations 04/01/2018   Irregular heart beat 04/01/2018   Ulcerative colitis (HCC) 01/25/2018   Vitamin D  deficiency 01/25/2018   Fatigue 01/25/2018   Shortness of breath on exertion 06/08/2017   Knee pain, right 08/22/2015   Cervical radiculopathy 01/10/2015   Anxiety 09/12/2014   Osteopenia 09/12/2014   Neck pain 09/12/2014   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   Cancer (HCC) 2005   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   Overview:  Clinically Left C4 Clinically Left C4   Cancer (HCC) 2005   colon    Cervical radiculopathy 01/10/2015   Colon polyps    Eating disorder    Fatigue 01/25/2018   Fibroid    H/O rhinoplasty    History of chicken pox    Hypertension    Irregular heart beat 04/01/2018    Knee pain, right 08/22/2015   Left thyroid  nodule    needs follow up ultrasound in 2021   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   Malignant tumor of colon (HCC) 04/01/2018   Neck pain 09/12/2014   Osteopenia 2018   hips and spine   Palpitations 04/01/2018   Perimenopausal disorder 04/01/2018   Seasonal allergies    Shortness of breath on exertion 06/08/2017   Ulcerative colitis (HCC)    Uterine leiomyoma 04/01/2018   Vitamin D  deficiency     Family History  Problem Relation Age of Onset   Osteoporosis Mother    Hypertension Mother    Arthritis Mother    Hearing loss Mother    Cancer Father        prostate   Parkinson's disease Father        Dec age 43 from parkinsons/Lewey Body Dementia?   Hypertension Father    Hyperlipidemia Father    Osteoarthritis Father    Rheum arthritis Father    Cancer Maternal Uncle 48       colon cancer   Depression Maternal Grandmother    Diabetes Maternal Grandfather    Heart disease Maternal Grandfather    Hypertension Maternal Grandfather    Breast cancer Paternal Grandmother    Cancer Paternal Grandfather     Past Surgical History:  Procedure Laterality Date   BICEPT TENODESIS Right 02/02/2024   Procedure: TENODESIS, BICEPS;  Surgeon: Jasmine Mesi, MD;  Location: [facility] OR;  Service: Orthopedics;  Laterality: Right;   COLON SURGERY  2005   colon cancer--Dr.Rosenbower   ENDOMETRIAL ABLATION  2012   Dr.Lomax   KNEE ARTHROSCOPY Right    MYOMECTOMY  2012   Dr.Lomax   POSTERIOR LUMBAR FUSION 2 WITH HARDWARE REMOVAL Right 02/02/2024   Procedure: ARTHROSCOPY, SHOULDER WITH DEBRIDEMENT;  Surgeon: Jasmine Mesi, MD;  Location: [facility];  Service: Orthopedics;  Laterality: Right;  PROCEDURE:  RIGHT SHOULDER ARTHROSCOPY, DEBRIDEMENT, MINI OPEN BICEPS TENODESIS & ROTATOR CUFF TEAR REPAIR -SUPRASPINATUS AND SUBSCAPULARIS   SHOULDER OPEN ROTATOR CUFF REPAIR Right 02/02/2024   Procedure: REPAIR, ROTATOR CUFF, OPEN;  Surgeon:  Jasmine Mesi, MD;  Location: [facility] OR;  Service: Orthopedics;  Laterality: Right;   Social History   Occupational History   Not on file  Tobacco Use   Smoking status: Former    Current packs/day: 0.00    Types: Cigarettes    Quit date: 11/18/2011    Years since quitting: 12.3    Passive exposure: Never   Smokeless tobacco: Never  Vaping Use   Vaping status: Every Day   Start date: 11/18/2011  Substance and Sexual Activity   Alcohol use: Yes    Alcohol/week: 4.0 standard drinks of alcohol    Types: 4 Cans of beer per week  Comment: on the weekends   Drug use: No   Sexual activity: Yes    Partners: Male    Birth control/protection: Post-menopausal    Comment: Ablation 2012

## 2024-03-17 ENCOUNTER — Encounter: Payer: Self-pay | Admitting: Rehabilitative and Restorative Service Providers"

## 2024-03-17 ENCOUNTER — Ambulatory Visit: Admitting: Rehabilitative and Restorative Service Providers"

## 2024-03-17 DIAGNOSIS — R293 Abnormal posture: Secondary | ICD-10-CM | POA: Diagnosis not present

## 2024-03-17 DIAGNOSIS — M25511 Pain in right shoulder: Secondary | ICD-10-CM | POA: Diagnosis not present

## 2024-03-17 DIAGNOSIS — M6281 Muscle weakness (generalized): Secondary | ICD-10-CM | POA: Diagnosis not present

## 2024-03-17 DIAGNOSIS — M25611 Stiffness of right shoulder, not elsewhere classified: Secondary | ICD-10-CM

## 2024-03-17 NOTE — Therapy (Signed)
 OUTPATIENT PHYSICAL THERAPY SHOULDER TREATMENT   Patient Name: Courtney Watson MRN: [id number] DOB:December 08, 1960, 63 y.o., female Today's Date: 03/17/2024  END OF SESSION:  PT End of Session - 03/17/24 1600     Visit Number 5    Number of Visits 19    Date for PT Re-Evaluation 04/22/24    Authorization Type [employer]    Authorization Time Period 02/26/24 to 04/22/24    PT Start Time 1559    PT Stop Time 1646    PT Time Calculation (min) 47 min    Activity Tolerance Patient tolerated treatment well;No increased pain;Patient limited by pain    Behavior During Therapy Courtney Watson for tasks assessed/performed             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   Malignant tumor of colon (HCC) 04/01/2018   Neck pain 09/12/2014   Osteopenia 2018   hips and spine   Palpitations 04/01/2018   Perimenopausal disorder 04/01/2018   Seasonal allergies    Shortness of breath on exertion 06/08/2017   Ulcerative colitis (HCC)    Uterine leiomyoma 04/01/2018   Vitamin D  deficiency    Past Surgical History:  Procedure Laterality Date   BICEPT TENODESIS Right 02/02/2024   Procedure: TENODESIS, BICEPS;  Surgeon: Courtney Mesi, MD;  Location: [facility] OR;  Service: Orthopedics;  Laterality: Right;   COLON SURGERY  2005   colon cancer--CourtneyRosenbower   ENDOMETRIAL ABLATION  2012   CourtneyLomax   KNEE ARTHROSCOPY Right    MYOMECTOMY  2012    CourtneyLomax   POSTERIOR LUMBAR FUSION 2 WITH HARDWARE REMOVAL Right 02/02/2024   Procedure: ARTHROSCOPY, SHOULDER WITH DEBRIDEMENT;  Surgeon: Courtney Mesi, MD;  Location: 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   Synovitis of right shoulder 02/07/2024   Biceps tendonitis on right 02/07/2024   Pain in right shoulder 01/08/2024   Inflamed seborrheic keratosis 11/17/2023   Nevus of right shoulder 11/17/2023   Onychoschizia 11/17/2023   Sun-damaged skin 11/17/2023   Encounter for annual physical exam 11/07/2022   Arthritis pain of hand 11/07/2022   High cholesterol 02/19/2022   Essential hypertension 11/21/2021   Seasonal allergies    History of chicken pox    H/O rhinoplasty    Fibroid  Eating disorder    Colon polyps    Blood in stool    Abnormal uterine bleeding    B12 deficiency 04/09/2020   Left thyroid  nodule    Upper airway cough syndrome 04/07/2019   Malignant tumor of colon (HCC) 04/01/2018   Perimenopausal disorder 04/01/2018   Uterine leiomyoma 04/01/2018   Palpitations 04/01/2018   Irregular heart beat 04/01/2018   Ulcerative colitis (HCC) 01/25/2018   Vitamin D  deficiency 01/25/2018   Fatigue 01/25/2018   Shortness of breath on exertion 06/08/2017   Knee pain, right 08/22/2015   Cervical radiculopathy 01/10/2015   Anxiety 09/12/2014   Osteopenia 09/12/2014   Neck pain 09/12/2014    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   Cancer (HCC) 2005    PCP: Courtney Watson   REFERRING PROVIDER: Jean [facility]  REFERRING DIAG: Please see note for PT instructions  Post right shoulder arthroscopy, RCR, BT on 02/02/2024  THERAPY DIAG:  Acute pain of right shoulder  Stiffness of right shoulder, not elsewhere classified  Muscle weakness (generalized)  Abnormal posture  Rationale for Evaluation and Treatment: Rehabilitation  ONSET DATE: Surgery 02/02/24   SUBJECTIVE:                                                                                                                                                                                      SUBJECTIVE STATEMENT: Courtney Watson saw Courtney Watson yesterday.  She is OK to progress AROM and be a bit more aggressive addressing capsular tightness.  Hand dominance: Right  PERTINENT HISTORY: Right shoulder arthroscopy RTC repair suprapspinatus & subscapularis, biceps tenodesis 02/02/24, Colon CA 2005 with sg, Brachial neuritis, cervical radiculopathy, HTN  PAIN:  Yes: NPRS scale:  1-5/10 this week Pain location: right shoulder more anterior and biceps  Pain description: dull ache Aggravating factors:  guarding, moving arm certain ways Relieving factors: ice, support, tylenol  prn  PRECAUTIONS: Plan is continue with range of motion exercises.  Start physical therapy upstairs to focus on passive and active assisted range of motion of the right shoulder (okay to start active assisted range of motion on 03/01/24).  Okay to start full active range of motion and rotator cuff strengthening exercises on 03/15/2024.  Follow-up in 3 weeks for clinical recheck and determination of return to work potentially.  RED FLAGS: None   WEIGHT BEARING RESTRICTIONS: Yes NWB surgicial shoulder   FALLS:  Has patient fallen in last 6 months? 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  DIAGNOSTIC FINDINGS:   Narrative & Impression  CLINICAL DATA:  Acute right shoulder pain. Limited range of motion. Cracking and popping.   EXAM: MRI OF THE RIGHT SHOULDER WITH CONTRAST   TECHNIQUE: Multiplanar, multisequence MR imaging of the right shoulder was performed following the administration of intra-articular contrast.   CONTRAST:  See Injection Documentation.   COMPARISON:  right shoulder radiographs 01/08/2024   FINDINGS: Rotator cuff: There is abnormal extension of gadolinium contrast into the subacromial/subdeltoid bursa. This appears to be through a full-thickness tear of the anterior supraspinatus tendon footprint measuring up to approximately 9 mm in AP dimension (sagittal series 8, image 8 and with up to only 4 mm tendon retraction (coronal series 5 and 6 images 11 through 13). The infraspinatus is intact. There is high-grade partial thickness tearing of the subscapularis tendon diffusely, with likely a full-thickness component superiorly and only minimal anterior fibers inserting inferiorly (axial images 8 through 15). The teres minor is intact.   Muscles:  Mild to moderate supraspinatus muscle atrophy.   Biceps long head: The origin of the long head of the biceps tendon appears to be intact. The biceps tendon is markedly attenuated just proximal to the bicipital groove, and there appears to be an at least 90% partial-thickness and possible full-thickness tear of  the tendon as it courses over the lesser tuberosity into the bicipital groove (axial images 11 through 13). A more normal caliber biceps tendon is seen within the more distal aspect of the bicipital groove (axial images 17-20).   Acromioclavicular Joint: There are mild degenerative changes of the acromioclavicular joint including joint space narrowing, subchondral marrow edema, and peripheral osteophytosis. Type II acromion.   Glenohumeral Joint: Mild glenohumeral cartilage thinning.   Labrum: There is adequate distention of the glenohumeral joint following arthrogram injection. No glenoid labral tear is seen.   Bones:  No acute fracture.   Other: None.   IMPRESSION: 1. Full-thickness tear of the anterior supraspinatus tendon footprint measuring up to approximately 9 mm in AP dimension and with up to only 4 mm tendon retraction. Mild to moderate supraspinatus muscle atrophy. 2. High-grade partial thickness tearing of the subscapularis tendon diffusely, with likely a full-thickness component superiorly and only minimal intact anterior fibers inserting inferiorly. 3. The long head of the biceps tendon is markedly attenuated just proximal to the bicipital groove, and there appears to be an at least 90% partial-thickness and possible full-thickness tear of the tendon as it courses over the lesser tuberosity into the bicipital groove. 4. Mild degenerative changes of the acromioclavicular joint.    PATIENT SURVEYS:   Patient-Specific Activity Scoring Scheme  "0" represents "unable to perform." "10" represents "able to perform at prior level. 0 1 2 3 4 5 6 7 8 9  10 (Date and Score)   Activity Eval   1. Doing hair/make up  1   2. Cooking   3  3. Getting dressed  4  4. Pick up things  0  5. Brush teeth  4  6.  Write normally  2  Score 2.3   Total score = sum of the activity scores/number of activities Minimum detectable change (90%CI)  for average score = 2 points Minimum  detectable change (90%CI) for single activity score = 3 points  COGNITION: Overall cognitive status: Within functional limits for tasks assessed     SENSATION: WFL  POSTURE: Rounded shoulders, forward head   UPPER EXTREMITY ROM:   Passive ROM Right eval Right 03/07/24 Right 03/17/2024  Shoulder flexion Initially 40*, able to get to 90* passive with repeated reps P: 98* 95*  Shoulder extension     Shoulder scaption  Initially about 30*, able to get to about 75* passive with repeated reps  P: 81*   Shoulder adduction     Shoulder internal rotation Hand to belly at 0* ABD   At 70 degrees abduction 45*  Shoulder external rotation Initially -30* at 0* ABD, able to get to about -20* passive with repeated reps  P: 38* at 45* abduction At 70 degrees abduction 35*  Elbow flexion     Elbow extension     Wrist flexion     Wrist extension     Wrist ulnar deviation     Wrist radial deviation     Wrist pronation     Wrist supination     (Blank rows = not tested)  UPPER EXTREMITY MMT:  MMT Right eval Left eval  Shoulder flexion    Shoulder extension    Shoulder abduction    Shoulder adduction    Shoulder internal rotation    Shoulder external rotation    Middle trapezius    Lower trapezius    Elbow flexion    Elbow extension    Wrist flexion    Wrist extension    Wrist ulnar deviation    Wrist radial deviation    Wrist pronation    Wrist supination    Grip strength (lbs)    (Blank rows = not tested)  Did not check MMT at eval due to post-op precautions    PALPATION:  Scar initially restricted but got it moving well with a bit of scar massage, encouraged this at home                                                                                                                              TREATMENT DATE:  03/17/2024 Scapular retraction/shoulder blade pinches 10 x 5 seconds Supine IR/ER AROM at 70 degrees abduction and elbow on multiple pillows to keep elbow slightly  higher than shoulder height 20 x 10 seconds in each direction with PT assistance for correction of technique  Functional Activities: Scapular protraction/supine arm raises 20 x 3 seconds (for reaching) Pulley flexion and scaption with decompression and palms in 10 x 10 seconds each (for reaching and overhead function) Reviewed and updated home exercise program with emphasis on AROM and capsular flexibility   03/09/2024 Therapeutic Exercise: Pt warm-up with pendulum with improved motion.   Table top slides with arm inside pillow case: flexion & scaption 10 reps 5 sec hold Supine dowel chest press  Dowel exercises 5 sec hold for gentle end range stretch supine flexion 10 reps; scaption 10 reps; external rotation 10 reps Standing dowel shoulder ext 5 sec hold 10 reps AA (PT manually assisting) standing shoulder flexion with UE ranger flexion 10 reps Pulley AAROM flexion & scapation 2 min ea with PT demo & verbal cues on motion.  Patient had improved range in both planes today.  Manual Therapy: PROM right shoulder flexion, abduction and external rotation.    -Vasopnuematic device X 10 min, medium compression, 34 deg to Rt shoulder   TREATMENT DATE:  03/07/2024 Therapeutic Exercise: Pulley AAROM flexion & scapation 2 min ea with PT demo & verbal cues on motion.   RUE Supine chest press AAROM with dowel 10 reps 2 sets RUE AAROM shoulder flexion with dowel 10 reps 2 sets RUE AAROM shoulder abduction with dowel 10 reps 2 sets RUE AAROM shoulder external rotation with dowel 10 reps 2 sets PT demo & verbal cues on pendulum exercises. Pt verbalized better understanding.  Supine RUE elbow flexion & extension with 10 reps ea with forearm neutral, pronation & supination.   Self-Care: PT verbal & demo cues on positioning RUE when seated to support weight of arm. Pt verbalized understanding & reports felt better.  PT recommending to decrease use of sling unless in crowd. Pt verbalized understanding.    PT verbal & demo cues on positioning in supine with pillow under head & neck not shoulder.   Manual Therapy: PROM right shoulder flexion, abduction and external rotation.   PROM biceps stretch with UE beside trunk & ~90* flexion.   -Vasopnuematic device X 10 min, medium compression, 34 deg to Rt shoulder  HOME EXERCISE PROGRAM: Access Code: [email or web address] URL: [email or web address] Date: 03/17/2024 Prepared by: Terral Ferrari  Exercises - Seated Shoulder Flexion Towel Slide at Table Top  - 2-3 x daily - 6 x weekly - 1 sets - 10 reps - 5 sec hold - Seated Shoulder Abduction Towel Slide at Table Top (Mirrored)  - 2-3 x daily - 6 x weekly - 1 sets - 10 reps - 5 sec hold - Seated Shoulder External Rotation PROM on Table (Mirrored)  - 2-3 x daily - 6 x weekly - 1 sets - 10 reps - 5 sec hold - Supine Shoulder Flexion AAROM with Hands Clasped  - 2 x daily - 6 x weekly - 1 sets - 10 reps - 5 sec hold - Seated Shoulder Flexion AAROM with Pulley Behind  - 2 x daily - 7 x weekly - 1 sets - 10-20 reps - 10 seconds hold - Seated Shoulder Scaption AAROM with Pulley at Side  - 2 x daily - 7 x weekly - 1 sets - 10-20 reps - 10 seconds hold - Supine Shoulder Press AAROM in Abduction with Dowel  - 1-2 x daily - 7 x weekly - 1 sets - 10 reps - 5 seconds hold - Supine Shoulder Flexion Extension AAROM with Dowel  - 1-2 x daily - 7 x weekly - 1 sets - 10 reps - 5 seconds hold - Supine Shoulder Abduction AAROM with Dowel  - 1-2 x daily - 7 x weekly - 1 sets - 10 reps - 5 seconds hold - Supine Shoulder External Rotation in 45 Degrees Abduction AAROM with Dowel  - 1-2 x daily - 7 x weekly - 1 sets - 10 reps - 5 seconds hold - Standing Scapular Retraction  - 5 x daily - 7 x weekly - 1 sets -  5 reps - 5 second hold - Supine Scapular Protraction in Flexion with Dumbbells  - 2 x daily - 7 x weekly - 1 sets - 20-30 reps - 3 seconds hold - Supine Shoulder External Rotation Stretch  - 2-3 x daily - 7 x weekly -  1 sets - 10-20 reps - 10 seconds hold - Supine Shoulder Internal Rotation Stretch  - 1 x daily - 7 x weekly - 1 sets - 10-20 reps - 10 seconds hold   ASSESSMENT:  CLINICAL IMPRESSION: Shasha had a good follow-up with [place] yesterday and she is okay to be a bit more aggressive with her active range of motion and capsular flexibility.  It is still a good idea to avoid being too aggressive in external rotation due to her healing subscapularis repair, although she can start working in that direction.  Courtney Watson and I reviewed her home exercise program today with more emphasis on scapular strengthening and capsular flexibility.  We discussed how she is a bit behind on her active range of motion, although not to the point that she should be concerned as consistent compliance with her current home exercises, particular the ones that we emphasized today should allow her to meet all long-term goals.  OBJECTIVE IMPAIRMENTS: decreased ROM, decreased strength, increased edema, increased fascial restrictions, increased muscle spasms, impaired flexibility, impaired UE functional use, improper body mechanics, postural dysfunction, and pain.   ACTIVITY LIMITATIONS: carrying, lifting, bathing, toileting, dressing, self feeding, reach over head, hygiene/grooming, and caring for others  PARTICIPATION LIMITATIONS: meal prep, cleaning, laundry, driving, shopping, community activity, occupation, and yard work  PERSONAL FACTORS: Time since onset of injury/illness/exacerbation are also affecting patient's functional outcome.   REHAB POTENTIAL: Excellent  CLINICAL DECISION MAKING: Stable/uncomplicated  EVALUATION COMPLEXITY: Low   GOALS: Goals reviewed with patient? No  SHORT TERM GOALS: Target date:  03/25/2024     Patient will be compliant with appropriate progressive HEP        GOAL STATUS: Met 03/17/2024  2. R shoulder flexion and ABD A/PROM to be at least 150*         GOAL STATUS: Ongoing  03/17/2024    3.  R shoulder IR and ER A/PROM to be at least 45* at 90* ABD           GOAL STATUS: Ongoing  03/17/2024   4. Will be more aware of posture with all functional tasks with use of ergonomic aides PRN/as desired      GOAL STATUS: Ongoing  03/17/2024     LONG TERM GOALS: Target date: 04/22/2024    MMT in surgical shoulder to be at least 4/5 all tested groups  Baseline:  Goal status: Ongoing  03/07/2024  2.  Pain to be no more than 2/10 with all functional task performance  Baseline:  Goal status: Ongoing  03/17/2024  3.  Will be able to perform all personal care and hygiene without increase in pain or difficulty with surgical shoulder  Baseline:  Goal status: Ongoing  03/17/2024  4.  Will be able to perform all home and work based tasks and requirements without increase in pain surgical shoulder  Baseline:  Goal status: Ongoing  03/17/2024  5.  PSFS to have improved by at least 4 points  Baseline:  Goal status:  Ongoing  03/07/2024    PLAN:  PT FREQUENCY:  3x/week for first 2 weeks, then drop to 2x/week after that   PT DURATION: 8 weeks  PLANNED INTERVENTIONS: 97750- Physical Performance  Testing, 97110-Therapeutic exercises, 97530- Therapeutic activity, [id number]- Neuromuscular re-education, [id number]- Self Care, 16109- Manual therapy, [id number]- Aquatic Therapy, [phone]- Electrical stimulation (unattended), 97016- Vasopneumatic device, [id number]- Ultrasound, [id number]- Ionotophoresis 4mg /ml Dexamethasone , Patient/Family education, Taping, Dry Needling, Cryotherapy, and Moist heat  PLAN FOR NEXT SESSION: AROM and capsular flexibility emphasis.       Joli Neas, PT, MPT 03/17/2024, 4:58 PM

## 2024-03-18 ENCOUNTER — Ambulatory Visit: Admitting: Physical Therapy

## 2024-03-18 ENCOUNTER — Encounter: Payer: Self-pay | Admitting: Physical Therapy

## 2024-03-18 DIAGNOSIS — M25611 Stiffness of right shoulder, not elsewhere classified: Secondary | ICD-10-CM

## 2024-03-18 DIAGNOSIS — M25511 Pain in right shoulder: Secondary | ICD-10-CM | POA: Diagnosis not present

## 2024-03-18 DIAGNOSIS — R293 Abnormal posture: Secondary | ICD-10-CM

## 2024-03-18 DIAGNOSIS — M6281 Muscle weakness (generalized): Secondary | ICD-10-CM | POA: Diagnosis not present

## 2024-03-18 NOTE — Therapy (Signed)
 OUTPATIENT PHYSICAL THERAPY SHOULDER TREATMENT   Patient Name: Courtney Watson MRN: [id number] DOB:[birth year], 63 y.o., female Today's Date: 03/18/2024  END OF SESSION:  PT End of Session - 03/18/24 1434     Visit Number 6    Number of Visits 19    Date for PT Re-Evaluation 04/22/24    Authorization Type [employer]    Authorization Time Period 02/26/24 to 04/22/24    PT Start Time 1433    PT Stop Time 1513    PT Time Calculation (min) 40 min    Activity Tolerance Patient tolerated treatment well;No increased pain    Behavior During Therapy [facility] for tasks assessed/performed              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   Malignant tumor of colon (HCC) 04/01/2018   Neck pain 09/12/2014   Osteopenia 2018   hips and spine   Palpitations 04/01/2018   Perimenopausal disorder 04/01/2018   Seasonal allergies    Shortness of breath on exertion 06/08/2017   Ulcerative colitis (HCC)    Uterine leiomyoma 04/01/2018   Vitamin D  deficiency    Past Surgical History:  Procedure Laterality Date   BICEPT TENODESIS Right 02/02/2024   Procedure: TENODESIS, BICEPS;  Surgeon: Jasmine Mesi, MD;  Location: [facility] OR;  Service: Orthopedics;  Laterality: Right;   COLON SURGERY  2005   colon cancer--Dr.Rosenbower   ENDOMETRIAL ABLATION  2012   Dr.Lomax   KNEE ARTHROSCOPY Right    MYOMECTOMY  2012   Dr.Lomax   POSTERIOR LUMBAR  FUSION 2 WITH HARDWARE REMOVAL Right 02/02/2024   Procedure: ARTHROSCOPY, SHOULDER WITH DEBRIDEMENT;  Surgeon: Jasmine Mesi, MD;  Location: [facility] OR;  Service: Orthopedics;  Laterality: Right;  PROCEDURE:  RIGHT SHOULDER ARTHROSCOPY, DEBRIDEMENT, MINI OPEN BICEPS TENODESIS & ROTATOR CUFF TEAR REPAIR -SUPRASPINATUS AND SUBSCAPULARIS   SHOULDER OPEN ROTATOR CUFF REPAIR Right 02/02/2024   Procedure: REPAIR, ROTATOR CUFF, OPEN;  Surgeon: Jasmine Mesi, MD;  Location: [facility] OR;  Service: Orthopedics;  Laterality: Right;   Patient Active Problem List   Diagnosis Date Noted   Nontraumatic tear of right rotator cuff 02/07/2024   Degenerative superior labral anterior-to-posterior (SLAP) tear of right shoulder 02/07/2024   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   Synovitis of right shoulder 02/07/2024   Biceps tendonitis on right 02/07/2024   Pain in right shoulder 01/08/2024   Inflamed seborrheic keratosis 11/17/2023   Nevus of right shoulder 11/17/2023   Onychoschizia 11/17/2023   Sun-damaged skin 11/17/2023   Encounter for annual physical exam 11/07/2022   Arthritis pain of hand 11/07/2022   High cholesterol 02/19/2022   Essential hypertension 11/21/2021   Seasonal allergies    History of chicken pox    H/O rhinoplasty    Fibroid  Eating disorder    Colon polyps    Blood in stool    Abnormal uterine bleeding    B12 deficiency 04/09/2020   Left thyroid  nodule    Upper airway cough syndrome 04/07/2019   Malignant tumor of colon (HCC) 04/01/2018   Perimenopausal disorder 04/01/2018   Uterine leiomyoma 04/01/2018   Palpitations 04/01/2018   Irregular heart beat 04/01/2018   Ulcerative colitis (HCC) 01/25/2018   Vitamin D  deficiency 01/25/2018   Fatigue 01/25/2018   Shortness of breath on exertion 06/08/2017   Knee pain, right 08/22/2015   Cervical radiculopathy 01/10/2015   Anxiety 09/12/2014   Osteopenia 09/12/2014   Neck pain 09/12/2014   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   Cancer (HCC) 2005    PCP: Allwardt, Alyssa PA-C   REFERRING PROVIDER: Jean Michaelis  REFERRING DIAG: Please see note for PT instructions  Post right shoulder arthroscopy, RCR, BT on 02/02/2024  THERAPY DIAG:  Acute pain of right shoulder  Stiffness of right shoulder, not elsewhere classified  Muscle weakness (generalized)  Abnormal posture  Rationale for Evaluation and Treatment: Rehabilitation  ONSET DATE: Surgery 02/02/24   SUBJECTIVE:                                                                                                                                                                                      SUBJECTIVE STATEMENT:  Nothing really new going on, I was here yesterday with Rob. Its getting there, its just still tight.   Hand dominance: Right  PERTINENT HISTORY: Right shoulder arthroscopy RTC repair suprapspinatus & subscapularis, biceps tenodesis 02/02/24, Colon CA 2005 with sg, Brachial neuritis, cervical radiculopathy, HTN  PAIN:  Yes: NPRS scale:  2/10 now  Pain location: right shoulder more anterior and biceps  Pain description: dull ache Aggravating factors:  lifting arm, holding it up to type can make pain radiate down into biceps  Relieving factors: movements, pendulums   PRECAUTIONS: Plan is continue with range of motion exercises.  Start physical therapy upstairs to focus on passive and active assisted range of motion of the right shoulder (okay to start active assisted range of motion on 03/01/24).  Okay to start full active range of motion and rotator cuff strengthening exercises on 03/15/2024.  Follow-up in 3 weeks for clinical recheck and determination of return to work potentially.  RED FLAGS: None   WEIGHT BEARING RESTRICTIONS: Yes NWB surgicial shoulder   FALLS:  Has patient fallen in last 6 months? 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  DIAGNOSTIC FINDINGS:   Narrative & Impression  CLINICAL DATA:  Acute right shoulder pain. Limited range of motion. Cracking and popping.   EXAM: MRI OF THE RIGHT SHOULDER WITH CONTRAST   TECHNIQUE: Multiplanar, multisequence MR imaging of the right shoulder was performed following the administration of intra-articular contrast.   CONTRAST:  See Injection Documentation.   COMPARISON:  right shoulder radiographs 01/08/2024   FINDINGS: Rotator cuff: There is abnormal extension of gadolinium contrast into the subacromial/subdeltoid bursa. This appears to be through a full-thickness tear of the anterior supraspinatus tendon footprint measuring up to approximately 9 mm in AP dimension (sagittal series 8, image 8 and with up to only 4 mm tendon retraction (coronal series 5 and 6 images 11 through 13). The infraspinatus is intact. There is high-grade partial thickness tearing of the subscapularis tendon diffusely, with likely a full-thickness component superiorly and only minimal anterior fibers inserting inferiorly (axial images 8 through 15). The teres minor is intact.   Muscles:  Mild to moderate supraspinatus muscle atrophy.   Biceps long head: The origin of the long head of the biceps tendon appears to be intact. The biceps tendon is markedly attenuated just proximal to the bicipital groove, and there appears to be an at least 90% partial-thickness and possible full-thickness tear of the tendon as  it courses over the lesser tuberosity into the bicipital groove (axial images 11 through 13). A more normal caliber biceps tendon is seen within the more distal aspect of the bicipital groove (axial images 17-20).   Acromioclavicular Joint: There are mild degenerative changes of the acromioclavicular joint including joint space narrowing, subchondral marrow edema, and peripheral osteophytosis. Type II acromion.   Glenohumeral Joint: Mild glenohumeral cartilage thinning.   Labrum: There is adequate distention of the glenohumeral joint following arthrogram injection. No glenoid labral tear is seen.   Bones:  No acute fracture.   Other: None.   IMPRESSION: 1. Full-thickness tear of the anterior supraspinatus tendon footprint measuring up to approximately 9 mm in AP dimension and with up to only 4 mm tendon retraction. Mild to moderate supraspinatus muscle atrophy. 2. High-grade partial thickness tearing of the subscapularis tendon diffusely, with likely a full-thickness component superiorly and only minimal intact anterior fibers inserting inferiorly. 3. The long head of the biceps tendon is markedly attenuated just proximal to the bicipital groove, and there appears to be an at least 90% partial-thickness and possible full-thickness tear of the tendon as it courses over the lesser tuberosity into the bicipital groove. 4. Mild degenerative changes of the acromioclavicular joint.    PATIENT SURVEYS:   Patient-Specific Activity Scoring Scheme  "0" represents "unable to perform." "10" represents "able to perform at prior level. 0 1 2 3 4 5 6 7 8 9  10 (Date and Score)   Activity Eval   1. Doing hair/make up  1   2. Cooking   3  3. Getting dressed  4  4. Pick up things  0  5. Brush teeth  4  6.  Write normally  2  Score 2.3   Total score = sum of  the activity scores/number of activities Minimum detectable change (90%CI) for average score = 2 points Minimum detectable  change (90%CI) for single activity score = 3 points  COGNITION: Overall cognitive status: Within functional limits for tasks assessed     SENSATION: WFL  POSTURE: Rounded shoulders, forward head   UPPER EXTREMITY ROM:   Passive ROM Right eval Right 03/07/24 Right 03/17/2024  Shoulder flexion Initially 40*, able to get to 90* passive with repeated reps P: 98* 95*  Shoulder extension     Shoulder scaption  Initially about 30*, able to get to about 75* passive with repeated reps  P: 81*   Shoulder adduction     Shoulder internal rotation Hand to belly at 0* ABD   At 70 degrees abduction 45*  Shoulder external rotation Initially -30* at 0* ABD, able to get to about -20* passive with repeated reps  P: 38* at 45* abduction At 70 degrees abduction 35*  Elbow flexion     Elbow extension     Wrist flexion     Wrist extension     Wrist ulnar deviation     Wrist radial deviation     Wrist pronation     Wrist supination     (Blank rows = not tested)  UPPER EXTREMITY MMT:  MMT Right eval Left eval  Shoulder flexion    Shoulder extension    Shoulder abduction    Shoulder adduction    Shoulder internal rotation    Shoulder external rotation    Middle trapezius    Lower trapezius    Elbow flexion    Elbow extension    Wrist flexion    Wrist extension    Wrist ulnar deviation    Wrist radial deviation    Wrist pronation    Wrist supination    Grip strength (lbs)    (Blank rows = not tested)  Did not check MMT at eval due to post-op precautions    PALPATION:  Scar initially restricted but got it moving well with a bit of scar massage, encouraged this at home                                                                                                                              TREATMENT DATE:   03/18/24   Supine shoulder flexion AAROM 1# rod 15x5 seconds Attempted supine ABD AAROM- stopped due to radicular pain going down R UE  Seated ER AAROM with 1# dowel x15  towel tucked to ribs  Attempted yellow band ER standing, unable Sidelying shoulder ER 1# 2x10  Supine flexion AROM 1# x10  Supine serratus punches 0# x15  Wall ladder flexion 12x3 second holds to notch 26     03/17/2024 Scapular retraction/shoulder blade pinches 10 x 5 seconds Supine IR/ER AROM at 70 degrees abduction and elbow on multiple pillows to keep elbow slightly higher than shoulder height 20 x 10 seconds in each direction with PT  assistance for correction of technique  Functional Activities: Scapular protraction/supine arm raises 20 x 3 seconds (for reaching) Pulley flexion and scaption with decompression and palms in 10 x 10 seconds each (for reaching and overhead function) Reviewed and updated home exercise program with emphasis on AROM and capsular flexibility   03/09/2024 Therapeutic Exercise: Pt warm-up with pendulum with improved motion.   Table top slides with arm inside pillow case: flexion & scaption 10 reps 5 sec hold Supine dowel chest press Dowel exercises 5 sec hold for gentle end range stretch supine flexion 10 reps; scaption 10 reps; external rotation 10 reps Standing dowel shoulder ext 5 sec hold 10 reps AA (PT manually assisting) standing shoulder flexion with UE ranger flexion 10 reps Pulley AAROM flexion & scapation 2 min ea with PT demo & verbal cues on motion.  Patient had improved range in both planes today.  Manual Therapy: PROM right shoulder flexion, abduction and external rotation.    -Vasopnuematic device X 10 min, medium compression, 34 deg to Rt shoulder   TREATMENT DATE:  03/07/2024 Therapeutic Exercise: Pulley AAROM flexion & scapation 2 min ea with PT demo & verbal cues on motion.   RUE Supine chest press AAROM with dowel 10 reps 2 sets RUE AAROM shoulder flexion with dowel 10 reps 2 sets RUE AAROM shoulder abduction with dowel 10 reps 2 sets RUE AAROM shoulder external rotation with dowel 10 reps 2 sets PT demo & verbal cues on  pendulum exercises. Pt verbalized better understanding.  Supine RUE elbow flexion & extension with 10 reps ea with forearm neutral, pronation & supination.   Self-Care: PT verbal & demo cues on positioning RUE when seated to support weight of arm. Pt verbalized understanding & reports felt better.  PT recommending to decrease use of sling unless in crowd. Pt verbalized understanding.   PT verbal & demo cues on positioning in supine with pillow under head & neck not shoulder.   Manual Therapy: PROM right shoulder flexion, abduction and external rotation.   PROM biceps stretch with UE beside trunk & ~90* flexion.   -Vasopnuematic device X 10 min, medium compression, 34 deg to Rt shoulder  HOME EXERCISE PROGRAM: Access Code: [email or web address] URL: [email or web address] Date: 03/17/2024 Prepared by: Terral Ferrari  Exercises - Seated Shoulder Flexion Towel Slide at Table Top  - 2-3 x daily - 6 x weekly - 1 sets - 10 reps - 5 sec hold - Seated Shoulder Abduction Towel Slide at Table Top (Mirrored)  - 2-3 x daily - 6 x weekly - 1 sets - 10 reps - 5 sec hold - Seated Shoulder External Rotation PROM on Table (Mirrored)  - 2-3 x daily - 6 x weekly - 1 sets - 10 reps - 5 sec hold - Supine Shoulder Flexion AAROM with Hands Clasped  - 2 x daily - 6 x weekly - 1 sets - 10 reps - 5 sec hold - Seated Shoulder Flexion AAROM with Pulley Behind  - 2 x daily - 7 x weekly - 1 sets - 10-20 reps - 10 seconds hold - Seated Shoulder Scaption AAROM with Pulley at Side  - 2 x daily - 7 x weekly - 1 sets - 10-20 reps - 10 seconds hold - Supine Shoulder Press AAROM in Abduction with Dowel  - 1-2 x daily - 7 x weekly - 1 sets - 10 reps - 5 seconds hold - Supine Shoulder Flexion Extension AAROM with Dowel  - 1-2 x daily - 7 x  weekly - 1 sets - 10 reps - 5 seconds hold - Supine Shoulder Abduction AAROM with Dowel  - 1-2 x daily - 7 x weekly - 1 sets - 10 reps - 5 seconds hold - Supine Shoulder External Rotation in  45 Degrees Abduction AAROM with Dowel  - 1-2 x daily - 7 x weekly - 1 sets - 10 reps - 5 seconds hold - Standing Scapular Retraction  - 5 x daily - 7 x weekly - 1 sets - 5 reps - 5 second hold - Supine Scapular Protraction in Flexion with Dumbbells  - 2 x daily - 7 x weekly - 1 sets - 20-30 reps - 3 seconds hold - Supine Shoulder External Rotation Stretch  - 2-3 x daily - 7 x weekly - 1 sets - 10-20 reps - 10 seconds hold - Supine Shoulder Internal Rotation Stretch  - 1 x daily - 7 x weekly - 1 sets - 10-20 reps - 10 seconds hold   ASSESSMENT:  CLINICAL IMPRESSION:  Pt arrives today doing OK, we continued working on ROM and started some gentle strengthening as per most recent MD note.  Rotator cuff extremely weak especially ER groups. Doing OK, still a little behind in AROM but we will continue to challenge her as appropriate.   OBJECTIVE IMPAIRMENTS: decreased ROM, decreased strength, increased edema, increased fascial restrictions, increased muscle spasms, impaired flexibility, impaired UE functional use, improper body mechanics, postural dysfunction, and pain.   ACTIVITY LIMITATIONS: carrying, lifting, bathing, toileting, dressing, self feeding, reach over head, hygiene/grooming, and caring for others  PARTICIPATION LIMITATIONS: meal prep, cleaning, laundry, driving, shopping, community activity, occupation, and yard work  PERSONAL FACTORS: Time since onset of injury/illness/exacerbation are also affecting patient's functional outcome.   REHAB POTENTIAL: Excellent  CLINICAL DECISION MAKING: Stable/uncomplicated  EVALUATION COMPLEXITY: Low   GOALS: Goals reviewed with patient? No  SHORT TERM GOALS: Target date:  03/25/2024     Patient will be compliant with appropriate progressive HEP        GOAL STATUS: Met 03/17/2024  2. R shoulder flexion and ABD A/PROM to be at least 150*         GOAL STATUS: Ongoing  03/17/2024    3. R shoulder IR and ER A/PROM to be at least 45* at 90* ABD            GOAL STATUS: Ongoing  03/17/2024   4. Will be more aware of posture with all functional tasks with use of ergonomic aides PRN/as desired      GOAL STATUS: Ongoing  03/17/2024     LONG TERM GOALS: Target date: 04/22/2024    MMT in surgical shoulder to be at least 4/5 all tested groups  Baseline:  Goal status: Ongoing  03/07/2024  2.  Pain to be no more than 2/10 with all functional task performance  Baseline:  Goal status: Ongoing  03/17/2024  3.  Will be able to perform all personal care and hygiene without increase in pain or difficulty with surgical shoulder  Baseline:  Goal status: Ongoing  03/17/2024  4.  Will be able to perform all home and work based tasks and requirements without increase in pain surgical shoulder  Baseline:  Goal status: Ongoing  03/17/2024  5.  PSFS to have improved by at least 4 points  Baseline:  Goal status:  Ongoing  03/07/2024    PLAN:  PT FREQUENCY:  3x/week for first 2 weeks, then drop to 2x/week after that  PT DURATION: 8 weeks  PLANNED INTERVENTIONS: 97750- Physical Performance Testing, 97110-Therapeutic exercises, 97530- Therapeutic activity, [id number]- Neuromuscular re-education, 97535- Self Care, [id number]- Manual therapy, [id number]- Aquatic Therapy, W1191- Electrical stimulation (unattended), 97016- Vasopneumatic device, [id number]- Ultrasound, [id number]- Ionotophoresis 4mg /ml Dexamethasone , Patient/Family education, Taping, Dry Needling, Cryotherapy, and Moist heat  PLAN FOR NEXT SESSION: AROM and capsular flexibility emphasis.    Per MD note 03/16/24: Her strength is in a good spot for where she is at, considering she has not started any strengthening exercises yet. She should be okay to start rotator cuff strengthening exercises now. Continue with active motion.     Terrel Ferries, PT, DPT 03/18/24 3:14 PM

## 2024-03-22 ENCOUNTER — Ambulatory Visit: Admitting: Physical Therapy

## 2024-03-22 DIAGNOSIS — M6281 Muscle weakness (generalized): Secondary | ICD-10-CM

## 2024-03-22 DIAGNOSIS — M25511 Pain in right shoulder: Secondary | ICD-10-CM

## 2024-03-22 DIAGNOSIS — R293 Abnormal posture: Secondary | ICD-10-CM | POA: Diagnosis not present

## 2024-03-22 DIAGNOSIS — M25611 Stiffness of right shoulder, not elsewhere classified: Secondary | ICD-10-CM

## 2024-03-22 NOTE — Therapy (Signed)
 OUTPATIENT PHYSICAL THERAPY SHOULDER TREATMENT   Patient Name: Courtney Watson MRN: [id number] DOB:08-May-1961, 63 y.o., female Today's Date: 03/22/2024  END OF SESSION:  PT End of Session - 03/22/24 1518     Visit Number 7    Number of Visits 19    Date for PT Re-Evaluation 04/22/24    Authorization Type [employer]    Authorization Time Period 02/26/24 to 04/22/24    PT Start Time 1518    PT Stop Time 1600    PT Time Calculation (min) 42 min    Activity Tolerance Patient tolerated treatment well;No increased pain    Behavior During Therapy [facility] for tasks assessed/performed               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   Malignant tumor of colon (HCC) 04/01/2018   Neck pain 09/12/2014   Osteopenia 2018   hips and spine   Palpitations 04/01/2018   Perimenopausal disorder 04/01/2018   Seasonal allergies    Shortness of breath on exertion 06/08/2017   Ulcerative colitis (HCC)    Uterine leiomyoma 04/01/2018   Vitamin D  deficiency    Past Surgical History:  Procedure Laterality Date   BICEPT TENODESIS Right 02/02/2024   Procedure: TENODESIS, BICEPS;  Surgeon: Jasmine Mesi, MD;  Location[facility] OR;  Service: Orthopedics;  Laterality: Right;   COLON SURGERY  2005   colon cancer--Dr.Rosenbower   ENDOMETRIAL ABLATION  2012   Dr.Lomax   KNEE ARTHROSCOPY Right    MYOMECTOMY  2012   Dr.Lomax   POSTERIOR  LUMBAR FUSION 2 WITH HARDWARE REMOVAL Right 02/02/2024   Procedure: ARTHROSCOPY, SHOULDER WITH DEBRIDEMENT;  Surgeon: Jasmine Mesi, MD;  Location: [facility] OR;  Service: Orthopedics;  Laterality: Right;  PROCEDURE:  RIGHT SHOULDER ARTHROSCOPY, DEBRIDEMENT, MINI OPEN BICEPS TENODESIS & ROTATOR CUFF TEAR REPAIR -SUPRASPINATUS AND SUBSCAPULARIS   SHOULDER OPEN ROTATOR CUFF REPAIR Right 02/02/2024   Procedure: REPAIR, ROTATOR CUFF, OPEN;  Surgeon: Jasmine Mesi, MD;  Location: [facility] OR;  Service: Orthopedics;  Laterality: Right;   Patient Active Problem List   Diagnosis Date Noted   Nontraumatic tear of right rotator cuff 02/07/2024   Degenerative superior labral anterior-to-posterior (SLAP) tear of right shoulder 02/07/2024   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   Synovitis of right shoulder 02/07/2024   Biceps tendonitis on right 02/07/2024   Pain in right shoulder 01/08/2024   Inflamed seborrheic keratosis 11/17/2023   Nevus of right shoulder 11/17/2023   Onychoschizia 11/17/2023   Sun-damaged skin 11/17/2023   Encounter for annual physical exam 11/07/2022   Arthritis pain of hand 11/07/2022   High cholesterol 02/19/2022   Essential hypertension 11/21/2021   Seasonal allergies    History of chicken pox    H/O rhinoplasty    Fibroid  Eating disorder    Colon polyps    Blood in stool    Abnormal uterine bleeding    B12 deficiency 04/09/2020   Left thyroid  nodule    Upper airway cough syndrome 04/07/2019   Malignant tumor of colon (HCC) 04/01/2018   Perimenopausal disorder 04/01/2018   Uterine leiomyoma 04/01/2018   Palpitations 04/01/2018   Irregular heart beat 04/01/2018   Ulcerative colitis (HCC) 01/25/2018   Vitamin D  deficiency 01/25/2018   Fatigue 01/25/2018   Shortness of breath on exertion 06/08/2017   Knee pain, right 08/22/2015   Cervical radiculopathy 01/10/2015   Anxiety 09/12/2014   Osteopenia 09/12/2014   Neck pain 09/12/2014   Malignant neoplasm of  large intestine (HCC) 09/12/2014   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   Cancer (HCC) 2005    PCP: Allwardt, Alyssa PA-C   REFERRING PROVIDER: Jean Michaelis  REFERRING DIAG: Please see note for PT instructions  Post right shoulder arthroscopy, RCR, BT on 02/02/2024  THERAPY DIAG:  Acute pain of right shoulder  Stiffness of right shoulder, not elsewhere classified  Muscle weakness (generalized)  Abnormal posture  Rationale for Evaluation and Treatment: Rehabilitation  ONSET DATE: Surgery 02/02/24   SUBJECTIVE:                                                                                                                                                                                      SUBJECTIVE STATEMENT: "It's coming along." Pain and stiffness is improving but strength remains the issue.   Hand dominance: Right  PERTINENT HISTORY: Right shoulder arthroscopy RTC repair suprapspinatus & subscapularis, biceps tenodesis 02/02/24, Colon CA 2005 with sg, Brachial neuritis, cervical radiculopathy, HTN  PAIN:  Yes: NPRS scale:  2/10 now  Pain location: right shoulder more anterior and biceps  Pain description: dull ache Aggravating factors:  lifting arm, holding it up to type can make pain radiate down into biceps  Relieving factors: movements, pendulums   PRECAUTIONS: Plan is continue with range of motion exercises.  Start physical therapy upstairs to focus on passive and active assisted range of motion of the right shoulder (okay to start active assisted range of motion on 03/01/24).  Okay to start full active range of motion and rotator cuff strengthening exercises on 03/15/2024.  Follow-up in 3 weeks for clinical recheck and determination of return to work potentially.  RED FLAGS: None   WEIGHT BEARING RESTRICTIONS: Yes NWB surgicial shoulder   FALLS:  Has patient fallen in last 6 months? 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  DIAGNOSTIC FINDINGS:   Narrative & Impression  CLINICAL DATA:  Acute right shoulder pain. Limited range of motion. Cracking and popping.   EXAM: MRI OF THE RIGHT SHOULDER WITH CONTRAST   TECHNIQUE: Multiplanar, multisequence MR imaging of the right shoulder was performed following the administration of intra-articular contrast.   CONTRAST:  See Injection Documentation.   COMPARISON:  right shoulder radiographs 01/08/2024   FINDINGS: Rotator cuff: There is abnormal extension of gadolinium contrast into the subacromial/subdeltoid bursa. This appears to be through a full-thickness tear of the anterior supraspinatus tendon footprint measuring up to approximately 9 mm in AP dimension (sagittal series 8, image 8 and with up to only 4 mm tendon retraction (coronal series 5 and 6 images 11 through 13). The infraspinatus is intact. There is high-grade partial thickness tearing of the subscapularis tendon diffusely, with likely a full-thickness component superiorly and only minimal anterior fibers inserting inferiorly (axial images 8 through 15). The teres minor is intact.   Muscles:  Mild to moderate supraspinatus muscle atrophy.   Biceps long head: The origin of the long head of the biceps tendon appears to be intact. The biceps tendon is markedly attenuated just proximal to the bicipital groove, and there appears to be an at least 90% partial-thickness and possible full-thickness tear of the tendon as  it courses over the lesser tuberosity into the bicipital groove (axial images 11 through 13). A more normal caliber biceps tendon is seen within the more distal aspect of the bicipital groove (axial images 17-20).   Acromioclavicular Joint: There are mild degenerative changes of the acromioclavicular joint including joint space narrowing, subchondral marrow edema, and peripheral osteophytosis. Type II acromion.   Glenohumeral Joint: Mild glenohumeral cartilage thinning.   Labrum: There is adequate distention of the glenohumeral joint following arthrogram injection. No glenoid labral tear is seen.   Bones:  No acute fracture.   Other: None.   IMPRESSION: 1. Full-thickness tear of the anterior supraspinatus tendon footprint measuring up to approximately 9 mm in AP dimension and with up to only 4 mm tendon retraction. Mild to moderate supraspinatus muscle atrophy. 2. High-grade partial thickness tearing of the subscapularis tendon diffusely, with likely a full-thickness component superiorly and only minimal intact anterior fibers inserting inferiorly. 3. The long head of the biceps tendon is markedly attenuated just proximal to the bicipital groove, and there appears to be an at least 90% partial-thickness and possible full-thickness tear of the tendon as it courses over the lesser tuberosity into the bicipital groove. 4. Mild degenerative changes of the acromioclavicular joint.    PATIENT SURVEYS:   Patient-Specific Activity Scoring Scheme  "0" represents "unable to perform." "10" represents "able to perform at prior level. 0 1 2 3 4 5 6 7 8 9  10 (Date and Score)   Activity Eval   1. Doing hair/make up  1   2. Cooking   3  3. Getting dressed  4  4. Pick up things  0  5. Brush teeth  4  6.  Write normally  2  Score 2.3   Total score = sum of the activity scores/number of activities Minimum detectable  change (90%CI) for average score = 2 points Minimum detectable  change (90%CI) for single activity score = 3 points  COGNITION: Overall cognitive status: Within functional limits for tasks assessed     SENSATION: WFL  POSTURE: Rounded shoulders, forward head   UPPER EXTREMITY ROM:   Passive ROM Right eval Right 03/07/24 Right 03/17/2024 Right 03/22/24  Shoulder flexion Initially 40*, able to get to 90* passive with repeated reps P: 98* 95* AAROM: 130  Shoulder extension      Shoulder scaption  Initially about 30*, able to get to about 75* passive with repeated reps  P: 81*    Shoulder adduction      Shoulder internal rotation Hand to belly at 0* ABD   At 70 degrees abduction 45*   Shoulder external rotation Initially -30* at 0* ABD, able to get to about -20* passive with repeated reps  P: 38* at 45* abduction At 70 degrees abduction 35* 65 deg at 0 deg abd  Elbow flexion      Elbow extension      Wrist flexion      Wrist extension      Wrist ulnar deviation      Wrist radial deviation      Wrist pronation      Wrist supination      (Blank rows = not tested)  UPPER EXTREMITY MMT:  MMT Right eval Left eval  Shoulder flexion    Shoulder extension    Shoulder abduction    Shoulder adduction    Shoulder internal rotation    Shoulder external rotation    Middle trapezius    Lower trapezius    Elbow flexion    Elbow extension    Wrist flexion    Wrist extension    Wrist ulnar deviation    Wrist radial deviation    Wrist pronation    Wrist supination    Grip strength (lbs)    (Blank rows = not tested)  Did not check MMT at eval due to post-op precautions    PALPATION:  Scar initially restricted but got it moving well with a bit of scar massage, encouraged this at home                                                                                                                              TREATMENT DATE:  03/22/24 Supine shoulder flexion AAROM 1# rod 15x5 seconds Supine shoulder ER at 0 deg abd AAROM 1# rod 15x5  seconds Supine shoulder ER at 60 deg abd AAROM with scap squeeze 15x5 sec (cues to keep bicep from compensating) Supine shoulder abd AAROM with 1# rod 15x5 sec Sidelying ER 1# 2x10 Sidelying shoulder scaption 1# x10 Sidelying shoulder flexion 1# x10 (to fatigue) Standing row red TB 2x10 Standing shoulder ext red TB 2x10 Standing "W" against wall 2x10 Finger wall crawl flexion x10 focusing on decreasing UT compensation   03/18/24 Supine shoulder flexion AAROM  1# rod 15x5 seconds Attempted supine ABD AAROM- stopped due to radicular pain going down R UE  Seated ER AAROM with 1# dowel x15 towel tucked to ribs  Attempted yellow band ER standing, unable Sidelying shoulder ER 1# 2x10  Supine flexion AROM 1# x10  Supine serratus punches 0# x15  Wall ladder flexion 12x3 second holds to notch 26   03/17/2024 Scapular retraction/shoulder blade pinches 10 x 5 seconds Supine IR/ER AROM at 70 degrees abduction and elbow on multiple pillows to keep elbow slightly higher than shoulder height 20 x 10 seconds in each direction with PT assistance for correction of technique  Functional Activities: Scapular protraction/supine arm raises 20 x 3 seconds (for reaching) Pulley flexion and scaption with decompression and palms in 10 x 10 seconds each (for reaching and overhead function) Reviewed and updated home exercise program with emphasis on AROM and capsular flexibility   03/09/2024 Therapeutic Exercise: Pt warm-up with pendulum with improved motion.   Table top slides with arm inside pillow case: flexion & scaption 10 reps 5 sec hold Supine dowel chest press Dowel exercises 5 sec hold for gentle end range stretch supine flexion 10 reps; scaption 10 reps; external rotation 10 reps Standing dowel shoulder ext 5 sec hold 10 reps AA (PT manually assisting) standing shoulder flexion with UE ranger flexion 10 reps Pulley AAROM flexion & scapation 2 min ea with PT demo & verbal cues on motion.  Patient  had improved range in both planes today.  Manual Therapy: PROM right shoulder flexion, abduction and external rotation.    -Vasopnuematic device X 10 min, medium compression, 34 deg to Rt shoulder   03/07/2024 Therapeutic Exercise: Pulley AAROM flexion & scapation 2 min ea with PT demo & verbal cues on motion.   RUE Supine chest press AAROM with dowel 10 reps 2 sets RUE AAROM shoulder flexion with dowel 10 reps 2 sets RUE AAROM shoulder abduction with dowel 10 reps 2 sets RUE AAROM shoulder external rotation with dowel 10 reps 2 sets PT demo & verbal cues on pendulum exercises. Pt verbalized better understanding.  Supine RUE elbow flexion & extension with 10 reps ea with forearm neutral, pronation & supination.   Self-Care: PT verbal & demo cues on positioning RUE when seated to support weight of arm. Pt verbalized understanding & reports felt better.  PT recommending to decrease use of sling unless in crowd. Pt verbalized understanding.   PT verbal & demo cues on positioning in supine with pillow under head & neck not shoulder.   Manual Therapy: PROM right shoulder flexion, abduction and external rotation.   PROM biceps stretch with UE beside trunk & ~90* flexion.   -Vasopnuematic device X 10 min, medium compression, 34 deg to Rt shoulder  HOME EXERCISE PROGRAM: Access Code: [email or web address] URL: [email or web address] Date: 03/17/2024 Prepared by: Terral Ferrari  Exercises - Seated Shoulder Flexion Towel Slide at Table Top  - 2-3 x daily - 6 x weekly - 1 sets - 10 reps - 5 sec hold - Seated Shoulder Abduction Towel Slide at Table Top (Mirrored)  - 2-3 x daily - 6 x weekly - 1 sets - 10 reps - 5 sec hold - Seated Shoulder External Rotation PROM on Table (Mirrored)  - 2-3 x daily - 6 x weekly - 1 sets - 10 reps - 5 sec hold - Supine Shoulder Flexion AAROM with Hands Clasped  - 2 x daily - 6 x weekly - 1 sets - 10 reps - 5 sec  hold - Seated Shoulder Flexion AAROM with Pulley  Behind  - 2 x daily - 7 x weekly - 1 sets - 10-20 reps - 10 seconds hold - Seated Shoulder Scaption AAROM with Pulley at Side  - 2 x daily - 7 x weekly - 1 sets - 10-20 reps - 10 seconds hold - Supine Shoulder Press AAROM in Abduction with Dowel  - 1-2 x daily - 7 x weekly - 1 sets - 10 reps - 5 seconds hold - Supine Shoulder Flexion Extension AAROM with Dowel  - 1-2 x daily - 7 x weekly - 1 sets - 10 reps - 5 seconds hold - Supine Shoulder Abduction AAROM with Dowel  - 1-2 x daily - 7 x weekly - 1 sets - 10 reps - 5 seconds hold - Supine Shoulder External Rotation in 45 Degrees Abduction AAROM with Dowel  - 1-2 x daily - 7 x weekly - 1 sets - 10 reps - 5 seconds hold - Standing Scapular Retraction  - 5 x daily - 7 x weekly - 1 sets - 5 reps - 5 second hold - Supine Scapular Protraction in Flexion with Dumbbells  - 2 x daily - 7 x weekly - 1 sets - 20-30 reps - 3 seconds hold - Supine Shoulder External Rotation Stretch  - 2-3 x daily - 7 x weekly - 1 sets - 10-20 reps - 10 seconds hold - Supine Shoulder Internal Rotation Stretch  - 1 x daily - 7 x weekly - 1 sets - 10-20 reps - 10 seconds hold   ASSESSMENT:  CLINICAL IMPRESSION: Pt with continued improvements in shoulder ROM. Working on increasing strength and AROM without UT and pec compensation.   OBJECTIVE IMPAIRMENTS: decreased ROM, decreased strength, increased edema, increased fascial restrictions, increased muscle spasms, impaired flexibility, impaired UE functional use, improper body mechanics, postural dysfunction, and pain.   ACTIVITY LIMITATIONS: carrying, lifting, bathing, toileting, dressing, self feeding, reach over head, hygiene/grooming, and caring for others  PARTICIPATION LIMITATIONS: meal prep, cleaning, laundry, driving, shopping, community activity, occupation, and yard work  PERSONAL FACTORS: Time since onset of injury/illness/exacerbation are also affecting patient's functional outcome.   REHAB POTENTIAL:  Excellent  CLINICAL DECISION MAKING: Stable/uncomplicated  EVALUATION COMPLEXITY: Low   GOALS: Goals reviewed with patient? No  SHORT TERM GOALS: Target date:  03/25/2024     Patient will be compliant with appropriate progressive HEP        GOAL STATUS: Met 03/17/2024  2. R shoulder flexion and ABD A/PROM to be at least 150*         GOAL STATUS: Ongoing  03/17/2024    3. R shoulder IR and ER A/PROM to be at least 45* at 90* ABD           GOAL STATUS: Ongoing  03/17/2024   4. Will be more aware of posture with all functional tasks with use of ergonomic aides PRN/as desired      GOAL STATUS: Ongoing  03/17/2024     LONG TERM GOALS: Target date: 04/22/2024    MMT in surgical shoulder to be at least 4/5 all tested groups  Baseline:  Goal status: Ongoing  03/07/2024  2.  Pain to be no more than 2/10 with all functional task performance  Baseline:  Goal status: Ongoing  03/17/2024  3.  Will be able to perform all personal care and hygiene without increase in pain or difficulty with surgical shoulder  Baseline:  Goal status: Ongoing  03/17/2024  4.  Will be able to perform all home and work based tasks and requirements without increase in pain surgical shoulder  Baseline:  Goal status: Ongoing  03/17/2024  5.  PSFS to have improved by at least 4 points  Baseline:  Goal status:  Ongoing  03/07/2024    PLAN:  PT FREQUENCY:  3x/week for first 2 weeks, then drop to 2x/week after that   PT DURATION: 8 weeks  PLANNED INTERVENTIONS: 97750- Physical Performance Testing, 97110-Therapeutic exercises, 97530- Therapeutic activity, [id number]- Neuromuscular re-education, 97535- Self Care, 16109- Manual therapy, [id number]- Aquatic Therapy, U0454- Electrical stimulation (unattended), 97016- Vasopneumatic device, [id number]- Ultrasound, 09811- Ionotophoresis 4mg /ml Dexamethasone , Patient/Family education, Taping, Dry Needling, Cryotherapy, and Moist heat  PLAN FOR NEXT SESSION: AROM and capsular flexibility  emphasis.    Per MD note 03/16/24: Her strength is in a good spot for where she is at, considering she has not started any strengthening exercises yet. She should be okay to start rotator cuff strengthening exercises now. Continue with active motion.     Novali Vollman April Ma L Azzan Butler, PT, DPT 03/22/24 3:18 PM

## 2024-03-25 ENCOUNTER — Encounter: Payer: Self-pay | Admitting: Physical Therapy

## 2024-03-25 ENCOUNTER — Ambulatory Visit: Admitting: Physical Therapy

## 2024-03-25 DIAGNOSIS — M25511 Pain in right shoulder: Secondary | ICD-10-CM | POA: Diagnosis not present

## 2024-03-25 DIAGNOSIS — M6281 Muscle weakness (generalized): Secondary | ICD-10-CM | POA: Diagnosis not present

## 2024-03-25 DIAGNOSIS — M25611 Stiffness of right shoulder, not elsewhere classified: Secondary | ICD-10-CM

## 2024-03-25 DIAGNOSIS — R293 Abnormal posture: Secondary | ICD-10-CM

## 2024-03-25 NOTE — Therapy (Signed)
 OUTPATIENT PHYSICAL THERAPY SHOULDER TREATMENT   Patient Name: Courtney Watson MRN: [id number] DOB:26-Dec-1960, 63 y.o., female Today's Date: 03/25/2024  END OF SESSION:  PT End of Session - 03/25/24 1550     Visit Number 8    Number of Visits 19    Date for PT Re-Evaluation 04/22/24    Authorization Type [employer]    Authorization Time Period 02/26/24 to 04/22/24    PT Start Time 1530   mhp not included in billing but necessary due to high pain levels/poor exercise tolerance today   PT Stop Time 1558    PT Time Calculation (min) 28 min    Activity Tolerance Patient limited by pain    Behavior During Therapy [facility] for tasks assessed/performed                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   Malignant tumor of colon (HCC) 04/01/2018   Neck pain 09/12/2014   Osteopenia 2018   hips and spine   Palpitations 04/01/2018   Perimenopausal disorder 04/01/2018   Seasonal allergies    Shortness of breath on exertion 06/08/2017   Ulcerative colitis (HCC)    Uterine leiomyoma 04/01/2018   Vitamin D  deficiency    Past Surgical History:  Procedure Laterality Date   BICEPT TENODESIS Right 02/02/2024   Procedure: TENODESIS, BICEPS;  Surgeon: Jasmine Mesi, MD;  Location: [facility] OR;  Service: Orthopedics;  Laterality: Right;   COLON SURGERY  2005   colon cancer--Dr.Rosenbower   ENDOMETRIAL ABLATION  2012   Dr.Lomax    KNEE ARTHROSCOPY Right    MYOMECTOMY  2012   Dr.Lomax   POSTERIOR LUMBAR FUSION 2 WITH HARDWARE REMOVAL Right 02/02/2024   Procedure: ARTHROSCOPY, SHOULDER WITH DEBRIDEMENT;  Surgeon: Jasmine Mesi, MD;  Location: [facility] OR;  Service: Orthopedics;  Laterality: Right;  PROCEDURE:  RIGHT SHOULDER ARTHROSCOPY, DEBRIDEMENT, MINI OPEN BICEPS TENODESIS & ROTATOR CUFF TEAR REPAIR -SUPRASPINATUS AND SUBSCAPULARIS   SHOULDER OPEN ROTATOR CUFF REPAIR Right 02/02/2024   Procedure: REPAIR, ROTATOR CUFF, OPEN;  Surgeon: Jasmine Mesi, MD;  Location: [facility] OR;  Service: Orthopedics;  Laterality: Right;   Patient Active Problem List   Diagnosis Date Noted   Nontraumatic tear of right rotator cuff 02/07/2024   Degenerative superior labral anterior-to-posterior (SLAP) tear of right shoulder 02/07/2024   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   Synovitis of right shoulder 02/07/2024   Biceps tendonitis on right 02/07/2024   Pain in right shoulder 01/08/2024   Inflamed seborrheic keratosis 11/17/2023   Nevus of right shoulder 11/17/2023   Onychoschizia 11/17/2023   Sun-damaged skin 11/17/2023   Encounter for annual physical exam 11/07/2022   Arthritis pain of hand 11/07/2022   High cholesterol 02/19/2022   Essential hypertension 11/21/2021   Seasonal allergies  History of chicken pox    H/O rhinoplasty    Fibroid    Eating disorder    Colon polyps    Blood in stool    Abnormal uterine bleeding    B12 deficiency 04/09/2020   Left thyroid  nodule    Upper airway cough syndrome 04/07/2019   Malignant tumor of colon (HCC) 04/01/2018   Perimenopausal disorder 04/01/2018   Uterine leiomyoma 04/01/2018   Palpitations 04/01/2018   Irregular heart beat 04/01/2018   Ulcerative colitis (HCC) 01/25/2018   Vitamin D  deficiency 01/25/2018   Fatigue 01/25/2018   Shortness of breath on exertion 06/08/2017   Knee pain, right 08/22/2015   Cervical radiculopathy 01/10/2015   Anxiety 09/12/2014    Osteopenia 09/12/2014   Neck pain 09/12/2014   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   Cancer (HCC) 2005    PCP: Allwardt, Alyssa PA-C   REFERRING PROVIDER: Jean Michaelis  REFERRING DIAG: Please see note for PT instructions  Post right shoulder arthroscopy, RCR, BT on 02/02/2024  THERAPY DIAG:  Acute pain of right shoulder  Stiffness of right shoulder, not elsewhere classified  Muscle weakness (generalized)  Abnormal posture  Rationale for Evaluation and Treatment: Rehabilitation  ONSET DATE: Surgery 02/02/24   SUBJECTIVE:                                                                                                                                                                                      SUBJECTIVE STATEMENT:  Arm has really being hurting me since last visit. She was great but my arm is just really hurting me now, really feeling it. It wraps around the shoulder and I'm aching and aching and aching.   Hand dominance: Right  PERTINENT HISTORY: Right shoulder arthroscopy RTC repair suprapspinatus & subscapularis, biceps tenodesis 02/02/24, Colon CA 2005 with sg, Brachial neuritis, cervical radiculopathy, HTN  PAIN:  Yes: NPRS scale:  4-5/10 Pain location: right shoulder wrapping around whole shoulder  Pain description: dull ache but can be sharp  Aggravating factors:  everything at the moment  Relieving factors: ice or heat   PRECAUTIONS: Plan is continue with range of motion exercises.  Start physical therapy upstairs to focus on passive and active assisted range of motion of the right shoulder (okay to start active assisted range of motion on 03/01/24).  Okay to start full active range of motion and rotator cuff strengthening exercises on 03/15/2024.  Follow-up in 3 weeks for clinical recheck and determination of return to work potentially.  RED FLAGS: None   WEIGHT BEARING RESTRICTIONS:  Yes NWB surgicial shoulder   FALLS:  Has patient fallen in last 6 months? 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  DIAGNOSTIC FINDINGS:   Narrative & Impression  CLINICAL DATA:  Acute right shoulder pain. Limited range of motion. Cracking and popping.   EXAM: MRI OF THE RIGHT SHOULDER WITH CONTRAST   TECHNIQUE: Multiplanar, multisequence MR imaging of the right shoulder was performed following the administration of intra-articular contrast.   CONTRAST:  See Injection Documentation.   COMPARISON:  right shoulder radiographs 01/08/2024   FINDINGS: Rotator cuff: There is abnormal extension of gadolinium contrast into the subacromial/subdeltoid bursa. This appears to be through a full-thickness tear of the anterior supraspinatus tendon footprint measuring up to approximately 9 mm in AP dimension (sagittal series 8, image 8 and with up to only 4 mm tendon retraction (coronal series 5 and 6 images 11 through 13). The infraspinatus is intact. There is high-grade partial thickness tearing of the subscapularis tendon diffusely, with likely a full-thickness component superiorly and only minimal anterior fibers inserting inferiorly (axial images 8 through 15). The teres minor is intact.   Muscles:  Mild to moderate supraspinatus muscle atrophy.   Biceps long head: The origin of the long head of the biceps tendon appears to be intact. The biceps tendon is markedly attenuated just proximal to  the bicipital groove, and there appears to be an at least 90% partial-thickness and possible full-thickness tear of the tendon as it courses over the lesser tuberosity into the bicipital groove (axial images 11 through 13). A more normal caliber biceps tendon is seen within the more distal aspect of the bicipital groove (axial images 17-20).   Acromioclavicular Joint: There are mild degenerative changes of the acromioclavicular joint including joint space narrowing, subchondral marrow edema, and peripheral osteophytosis. Type II acromion.   Glenohumeral Joint: Mild glenohumeral cartilage thinning.   Labrum: There is adequate distention of the glenohumeral joint following arthrogram injection. No glenoid labral tear is seen.   Bones:  No acute fracture.   Other: None.   IMPRESSION: 1. Full-thickness tear of the anterior supraspinatus tendon footprint measuring up to approximately 9 mm in AP dimension and with up to only 4 mm tendon retraction. Mild to moderate supraspinatus muscle atrophy. 2. High-grade partial thickness tearing of the subscapularis tendon diffusely, with likely a full-thickness component superiorly and only minimal intact anterior fibers inserting inferiorly. 3. The long head of the biceps tendon is markedly attenuated just proximal to the bicipital groove, and there appears to be an at least 90% partial-thickness and possible full-thickness tear of the tendon as it courses over the lesser tuberosity into the bicipital groove. 4. Mild degenerative changes of the acromioclavicular joint.    PATIENT SURVEYS:   Patient-Specific Activity Scoring Scheme  "0" represents "unable to perform." "10" represents "able to perform at prior level. 0 1 2 3 4 5 6 7 8 9  10 (Date and Score)   Activity Eval   1. Doing hair/make up  1   2. Cooking   3  3. Getting dressed  4  4.  Pick up things  0  5. Brush teeth  4  6.  Write normally  2  Score 2.3   Total score = sum  of the activity scores/number of activities Minimum detectable change (90%CI) for average score = 2 points Minimum detectable change (90%CI) for single activity score = 3 points  COGNITION: Overall cognitive status: Within functional limits for tasks assessed     SENSATION: WFL  POSTURE: Rounded shoulders, forward head   UPPER EXTREMITY ROM:   Passive ROM Right eval Right 03/07/24 Right 03/17/2024 Right 03/22/24  Shoulder flexion Initially 40*, able to get to 90* passive with repeated reps P: 98* 95* AAROM: 130  Shoulder extension      Shoulder scaption  Initially about 30*, able to get to about 75* passive with repeated reps  P: 81*    Shoulder adduction      Shoulder internal rotation Hand to belly at 0* ABD   At 70 degrees abduction 45*   Shoulder external rotation Initially -30* at 0* ABD, able to get to about -20* passive with repeated reps  P: 38* at 45* abduction At 70 degrees abduction 35* 65 deg at 0 deg abd  Elbow flexion      Elbow extension      Wrist flexion      Wrist extension      Wrist ulnar deviation      Wrist radial deviation      Wrist pronation      Wrist supination      (Blank rows = not tested)  UPPER EXTREMITY MMT:  MMT Right eval Left eval  Shoulder flexion    Shoulder extension    Shoulder abduction    Shoulder adduction    Shoulder internal rotation    Shoulder external rotation    Middle trapezius    Lower trapezius    Elbow flexion    Elbow extension    Wrist flexion    Wrist extension    Wrist ulnar deviation    Wrist radial deviation    Wrist pronation    Wrist supination    Grip strength (lbs)    (Blank rows = not tested)  Did not check MMT at eval due to post-op precautions    PALPATION:  Scar initially restricted but got it moving well with a bit of scar massage, encouraged this at home                                                                                                                              TREATMENT  DATE:   03/25/24  MHP R shoulder x10 min (not included in billing)  PROM/stretching R shoulder for gentle mobility following heat all directions Supine flexion AAROM 1# rod x20 slow and easy  Sidelying flexion AROM 0# x15  Sidelying ER AROM 0# x15       03/22/24 Supine shoulder flexion AAROM 1# rod 15x5 seconds Supine shoulder ER at  0 deg abd AAROM 1# rod 15x5 seconds Supine shoulder ER at 60 deg abd AAROM with scap squeeze 15x5 sec (cues to keep bicep from compensating) Supine shoulder abd AAROM with 1# rod 15x5 sec Sidelying ER 1# 2x10 Sidelying shoulder scaption 1# x10 Sidelying shoulder flexion 1# x10 (to fatigue) Standing row red TB 2x10 Standing shoulder ext red TB 2x10 Standing "W" against wall 2x10 Finger wall crawl flexion x10 focusing on decreasing UT compensation   03/18/24 Supine shoulder flexion AAROM 1# rod 15x5 seconds Attempted supine ABD AAROM- stopped due to radicular pain going down R UE  Seated ER AAROM with 1# dowel x15 towel tucked to ribs  Attempted yellow band ER standing, unable Sidelying shoulder ER 1# 2x10  Supine flexion AROM 1# x10  Supine serratus punches 0# x15  Wall ladder flexion 12x3 second holds to notch 26   03/17/2024 Scapular retraction/shoulder blade pinches 10 x 5 seconds Supine IR/ER AROM at 70 degrees abduction and elbow on multiple pillows to keep elbow slightly higher than shoulder height 20 x 10 seconds in each direction with PT assistance for correction of technique  Functional Activities: Scapular protraction/supine arm raises 20 x 3 seconds (for reaching) Pulley flexion and scaption with decompression and palms in 10 x 10 seconds each (for reaching and overhead function) Reviewed and updated home exercise program with emphasis on AROM and capsular flexibility   03/09/2024 Therapeutic Exercise: Pt warm-up with pendulum with improved motion.   Table top slides with arm inside pillow case: flexion & scaption 10 reps 5 sec  hold Supine dowel chest press Dowel exercises 5 sec hold for gentle end range stretch supine flexion 10 reps; scaption 10 reps; external rotation 10 reps Standing dowel shoulder ext 5 sec hold 10 reps AA (PT manually assisting) standing shoulder flexion with UE ranger flexion 10 reps Pulley AAROM flexion & scapation 2 min ea with PT demo & verbal cues on motion.  Patient had improved range in both planes today.  Manual Therapy: PROM right shoulder flexion, abduction and external rotation.    -Vasopnuematic device X 10 min, medium compression, 34 deg to Rt shoulder   03/07/2024 Therapeutic Exercise: Pulley AAROM flexion & scapation 2 min ea with PT demo & verbal cues on motion.   RUE Supine chest press AAROM with dowel 10 reps 2 sets RUE AAROM shoulder flexion with dowel 10 reps 2 sets RUE AAROM shoulder abduction with dowel 10 reps 2 sets RUE AAROM shoulder external rotation with dowel 10 reps 2 sets PT demo & verbal cues on pendulum exercises. Pt verbalized better understanding.  Supine RUE elbow flexion & extension with 10 reps ea with forearm neutral, pronation & supination.   Self-Care: PT verbal & demo cues on positioning RUE when seated to support weight of arm. Pt verbalized understanding & reports felt better.  PT recommending to decrease use of sling unless in crowd. Pt verbalized understanding.   PT verbal & demo cues on positioning in supine with pillow under head & neck not shoulder.   Manual Therapy: PROM right shoulder flexion, abduction and external rotation.   PROM biceps stretch with UE beside trunk & ~90* flexion.   -Vasopnuematic device X 10 min, medium compression, 34 deg to Rt shoulder  HOME EXERCISE PROGRAM: Access Code: [email or web address] URL: [email or web address] Date: 03/17/2024 Prepared by: Terral Ferrari  Exercises - Seated Shoulder Flexion Towel Slide at Table Top  - 2-3 x daily - 6 x weekly - 1 sets - 10 reps - 5  sec hold - Seated Shoulder  Abduction Towel Slide at Table Top (Mirrored)  - 2-3 x daily - 6 x weekly - 1 sets - 10 reps - 5 sec hold - Seated Shoulder External Rotation PROM on Table (Mirrored)  - 2-3 x daily - 6 x weekly - 1 sets - 10 reps - 5 sec hold - Supine Shoulder Flexion AAROM with Hands Clasped  - 2 x daily - 6 x weekly - 1 sets - 10 reps - 5 sec hold - Seated Shoulder Flexion AAROM with Pulley Behind  - 2 x daily - 7 x weekly - 1 sets - 10-20 reps - 10 seconds hold - Seated Shoulder Scaption AAROM with Pulley at Side  - 2 x daily - 7 x weekly - 1 sets - 10-20 reps - 10 seconds hold - Supine Shoulder Press AAROM in Abduction with Dowel  - 1-2 x daily - 7 x weekly - 1 sets - 10 reps - 5 seconds hold - Supine Shoulder Flexion Extension AAROM with Dowel  - 1-2 x daily - 7 x weekly - 1 sets - 10 reps - 5 seconds hold - Supine Shoulder Abduction AAROM with Dowel  - 1-2 x daily - 7 x weekly - 1 sets - 10 reps - 5 seconds hold - Supine Shoulder External Rotation in 45 Degrees Abduction AAROM with Dowel  - 1-2 x daily - 7 x weekly - 1 sets - 10 reps - 5 seconds hold - Standing Scapular Retraction  - 5 x daily - 7 x weekly - 1 sets - 5 reps - 5 second hold - Supine Scapular Protraction in Flexion with Dumbbells  - 2 x daily - 7 x weekly - 1 sets - 20-30 reps - 3 seconds hold - Supine Shoulder External Rotation Stretch  - 2-3 x daily - 7 x weekly - 1 sets - 10-20 reps - 10 seconds hold - Supine Shoulder Internal Rotation Stretch  - 1 x daily - 7 x weekly - 1 sets - 10-20 reps - 10 seconds hold   ASSESSMENT:  CLINICAL IMPRESSION:  She arrived today having a lot of pain after last PT session- sounds like some delayed onset mm soreness and extra guarding from increase in progressive loading. We focused on pain control and active recovery today, used MHP to address mm spasms then worked on gentle PROM/AAROM and finally some AROM as tolerated. Advised her to continue with HEP as tolerated until next visit, use of heat and/or ice  as she finds helpful. Hopeful that she will be feeling better next visit and we can return to progressive resistance as able.   OBJECTIVE IMPAIRMENTS: decreased ROM, decreased strength, increased edema, increased fascial restrictions, increased muscle spasms, impaired flexibility, impaired UE functional use, improper body mechanics, postural dysfunction, and pain.   ACTIVITY LIMITATIONS: carrying, lifting, bathing, toileting, dressing, self feeding, reach over head, hygiene/grooming, and caring for others  PARTICIPATION LIMITATIONS: meal prep, cleaning, laundry, driving, shopping, community activity, occupation, and yard work  PERSONAL FACTORS: Time since onset of injury/illness/exacerbation are also affecting patient's functional outcome.   REHAB POTENTIAL: Excellent  CLINICAL DECISION MAKING: Stable/uncomplicated  EVALUATION COMPLEXITY: Low   GOALS: Goals reviewed with patient? No  SHORT TERM GOALS: Target date:  03/25/2024     Patient will be compliant with appropriate progressive HEP        GOAL STATUS: Met 03/17/2024  2. R shoulder flexion and ABD A/PROM to be at least 150*  GOAL STATUS: Ongoing  03/17/2024    3. R shoulder IR and ER A/PROM to be at least 45* at 90* ABD           GOAL STATUS: Ongoing  03/17/2024   4. Will be more aware of posture with all functional tasks with use of ergonomic aides PRN/as desired      GOAL STATUS: Ongoing  03/17/2024     LONG TERM GOALS: Target date: 04/22/2024    MMT in surgical shoulder to be at least 4/5 all tested groups  Baseline:  Goal status: Ongoing  03/07/2024  2.  Pain to be no more than 2/10 with all functional task performance  Baseline:  Goal status: Ongoing  03/17/2024  3.  Will be able to perform all personal care and hygiene without increase in pain or difficulty with surgical shoulder  Baseline:  Goal status: Ongoing  03/17/2024  4.  Will be able to perform all home and work based tasks and requirements without  increase in pain surgical shoulder  Baseline:  Goal status: Ongoing  03/17/2024  5.  PSFS to have improved by at least 4 points  Baseline:  Goal status:  Ongoing  03/07/2024    PLAN:  PT FREQUENCY: 3x/week for first 2 weeks, then drop to 2x/week after that   PT DURATION: 8 weeks  PLANNED INTERVENTIONS: 97750- Physical Performance Testing, 97110-Therapeutic exercises, 97530- Therapeutic activity, [id number]- Neuromuscular re-education, 97535- Self Care, 16109- Manual therapy, [id number]- Aquatic Therapy, [id number]- Electrical stimulation (unattended), 97016- Vasopneumatic device, [id number]- Ultrasound, [id number]- Ionotophoresis 4mg /ml Dexamethasone , Patient/Family education, Taping, Dry Needling, Cryotherapy, and Moist heat  PLAN FOR NEXT SESSION: AROM and capsular flexibility emphasis.  Is she feeling better? May need to progress resistance a little more slowly, she had a lot of pain after last visit   Per MD note 03/16/24: Her strength is in a good spot for where she is at, considering she has not started any strengthening exercises yet. She should be okay to start rotator cuff strengthening exercises now. Continue with active motion.    Terrel Ferries, PT, DPT 03/25/24 4:01 PM

## 2024-03-29 ENCOUNTER — Ambulatory Visit: Admitting: Physical Therapy

## 2024-03-29 DIAGNOSIS — M25511 Pain in right shoulder: Secondary | ICD-10-CM

## 2024-03-29 DIAGNOSIS — R293 Abnormal posture: Secondary | ICD-10-CM | POA: Diagnosis not present

## 2024-03-29 DIAGNOSIS — M6281 Muscle weakness (generalized): Secondary | ICD-10-CM | POA: Diagnosis not present

## 2024-03-29 DIAGNOSIS — M25611 Stiffness of right shoulder, not elsewhere classified: Secondary | ICD-10-CM

## 2024-03-29 NOTE — Therapy (Signed)
 OUTPATIENT PHYSICAL THERAPY SHOULDER TREATMENT   Patient Name: Courtney Watson MRN: [id number] DOB:[birth year], 63 y.o., female Today's Date: 03/29/2024  END OF SESSION:  PT End of Session - 03/29/24 1516     Visit Number 9    Number of Visits 19    Date for PT Re-Evaluation 04/22/24    Authorization Type [employer]    Authorization Time Period 02/26/24 to 04/22/24    PT Start Time 1516    PT Stop Time 1555    PT Time Calculation (min) 39 min    Activity Tolerance Patient limited by pain    Behavior During Therapy [facility] for tasks assessed/performed                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   Malignant tumor of colon (HCC) 04/01/2018   Neck pain 09/12/2014   Osteopenia 2018   hips and spine   Palpitations 04/01/2018   Perimenopausal disorder 04/01/2018   Seasonal allergies    Shortness of breath on exertion 06/08/2017   Ulcerative colitis (HCC)    Uterine leiomyoma 04/01/2018   Vitamin D  deficiency    Past Surgical History:  Procedure Laterality Date   BICEPT TENODESIS Right 02/02/2024   Procedure: TENODESIS, BICEPS;  Surgeon: Jasmine Mesi, MD;  Location: [facility] OR;  Service: Orthopedics;  Laterality: Right;   COLON SURGERY  2005   colon cancer--Dr.Rosenbower   ENDOMETRIAL ABLATION  2012   Dr.Lomax   KNEE ARTHROSCOPY Right    MYOMECTOMY  2012   Dr.Lomax   POSTERIOR LUMBAR FUSION 2 WITH HARDWARE  REMOVAL Right 02/02/2024   Procedure: ARTHROSCOPY, SHOULDER WITH DEBRIDEMENT;  Surgeon: Jasmine Mesi, MD;  Location: [facility] OR;  Service: Orthopedics;  Laterality: Right;  PROCEDURE:  RIGHT SHOULDER ARTHROSCOPY, DEBRIDEMENT, MINI OPEN BICEPS TENODESIS & ROTATOR CUFF TEAR REPAIR -SUPRASPINATUS AND SUBSCAPULARIS   SHOULDER OPEN ROTATOR CUFF REPAIR Right 02/02/2024   Procedure: REPAIR, ROTATOR CUFF, OPEN;  Surgeon: Jasmine Mesi, MD;  Location: [facility] OR;  Service: Orthopedics;  Laterality: Right;   Patient Active Problem List   Diagnosis Date Noted   Nontraumatic tear of right rotator cuff 02/07/2024   Degenerative superior labral anterior-to-posterior (SLAP) tear of right shoulder 02/07/2024   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   Synovitis of right shoulder 02/07/2024   Biceps tendonitis on right 02/07/2024   Pain in right shoulder 01/08/2024   Inflamed seborrheic keratosis 11/17/2023   Nevus of right shoulder 11/17/2023   Onychoschizia 11/17/2023   Sun-damaged skin 11/17/2023   Encounter for annual physical exam 11/07/2022   Arthritis pain of hand 11/07/2022   High cholesterol 02/19/2022   Essential hypertension 11/21/2021   Seasonal allergies    History of chicken pox    H/O rhinoplasty    Fibroid  Eating disorder    Colon polyps    Blood in stool    Abnormal uterine bleeding    B12 deficiency 04/09/2020   Left thyroid  nodule    Upper airway cough syndrome 04/07/2019   Malignant tumor of colon (HCC) 04/01/2018   Perimenopausal disorder 04/01/2018   Uterine leiomyoma 04/01/2018   Palpitations 04/01/2018   Irregular heart beat 04/01/2018   Ulcerative colitis (HCC) 01/25/2018   Vitamin D  deficiency 01/25/2018   Fatigue 01/25/2018   Shortness of breath on exertion 06/08/2017   Knee pain, right 08/22/2015   Cervical radiculopathy 01/10/2015   Anxiety 09/12/2014   Osteopenia 09/12/2014   Neck pain 09/12/2014   Malignant neoplasm of large intestine (HCC)  09/12/2014   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  Rationale for Evaluation and Treatment: Rehabilitation  ONSET DATE: Surgery 02/02/24   SUBJECTIVE:                                                                                                                                                                                      SUBJECTIVE STATEMENT: Pt states her shoulder is less achy this afternoon.   Hand dominance: Right  PERTINENT HISTORY: Right shoulder arthroscopy RTC repair suprapspinatus & subscapularis, biceps tenodesis 02/02/24, Colon CA 2005 with sg, Brachial neuritis, cervical radiculopathy, HTN  PAIN:  Yes: NPRS scale:  2/10 Pain location: right shoulder wrapping around whole shoulder  Pain description: dull ache but can be sharp  Aggravating factors:  everything at the moment  Relieving factors: ice or heat   PRECAUTIONS: Plan is continue with range of motion exercises.  Start physical therapy upstairs to focus on passive and active assisted range of motion of the right shoulder (okay to start active assisted range of motion on 03/01/24).  Okay to start full active range of motion and rotator cuff strengthening exercises on 03/15/2024.  Follow-up in 3 weeks for clinical recheck and determination of return to work potentially.  RED FLAGS: None   WEIGHT BEARING RESTRICTIONS: Yes NWB surgicial shoulder   FALLS:  Has patient fallen in last 6 months? 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  DIAGNOSTIC FINDINGS:   Narrative & Impression  CLINICAL DATA:  Acute right shoulder pain. Limited range of motion. Cracking and popping.   EXAM: MRI OF THE RIGHT SHOULDER WITH CONTRAST   TECHNIQUE: Multiplanar, multisequence MR imaging of the right shoulder was performed following the administration of intra-articular contrast.   CONTRAST:  See Injection Documentation.   COMPARISON:  right shoulder radiographs 01/08/2024   FINDINGS: Rotator cuff: There is abnormal extension of gadolinium contrast into the subacromial/subdeltoid bursa. This appears to be through a full-thickness tear of the anterior supraspinatus tendon footprint measuring up to approximately 9 mm in AP dimension (sagittal series 8, image 8 and with up to only 4 mm tendon retraction (coronal series 5 and 6 images 11 through 13). The infraspinatus is intact. There is high-grade partial thickness tearing of the subscapularis tendon diffusely, with likely a full-thickness component superiorly and only minimal anterior fibers inserting inferiorly (axial images 8 through 15). The teres minor is intact.   Muscles:  Mild to moderate supraspinatus muscle atrophy.   Biceps long head: The origin of the long head of the biceps tendon appears to be intact. The biceps tendon is markedly attenuated just proximal to the bicipital groove, and there appears to be an at least 90% partial-thickness and possible full-thickness tear of the tendon as it courses over the lesser tuberosity into the bicipital groove (axial images 11 through 13). A more normal caliber biceps tendon is seen within the more distal aspect of the bicipital groove (axial images 17-20).    Acromioclavicular Joint: There are mild degenerative changes of the acromioclavicular joint including joint space narrowing, subchondral marrow edema, and peripheral osteophytosis. Type II acromion.   Glenohumeral Joint: Mild glenohumeral cartilage thinning.   Labrum: There is adequate distention of the glenohumeral joint following arthrogram injection. No glenoid labral tear is seen.   Bones:  No acute fracture.   Other: None.   IMPRESSION: 1. Full-thickness tear of the anterior supraspinatus tendon footprint measuring up to approximately 9 mm in AP dimension and with up to only 4 mm tendon retraction. Mild to moderate supraspinatus muscle atrophy. 2. High-grade partial thickness tearing of the subscapularis tendon diffusely, with likely a full-thickness component superiorly and only minimal intact anterior fibers inserting inferiorly. 3. The long head of the biceps tendon is markedly attenuated just proximal to the bicipital groove, and there appears to be an at least 90% partial-thickness and possible full-thickness tear of the tendon as it courses over the lesser tuberosity into the bicipital groove. 4. Mild degenerative changes of the acromioclavicular joint.    PATIENT SURVEYS:   Patient-Specific Activity Scoring Scheme  "0" represents "unable to perform." "10" represents "able to perform at prior level. 0 1 2 3 4 5 6 7 8 9  10 (Date and Score)   Activity Eval   1. Doing hair/make up  1   2. Cooking   3  3. Getting dressed  4  4. Pick up things  0  5. Brush teeth  4  6.  Write normally  2  Score 2.3   Total score = sum of the activity scores/number of activities Minimum detectable change (90%CI) for average score = 2 points Minimum detectable change (90%CI) for single activity score = 3 points  COGNITION: Overall cognitive status: Within functional  limits for tasks assessed     SENSATION: WFL  POSTURE: Rounded shoulders, forward head   UPPER EXTREMITY  ROM:   Passive ROM Right eval Right 03/07/24 Right 03/17/2024 Right 03/22/24  Shoulder flexion Initially 40*, able to get to 90* passive with repeated reps P: 98* 95* AAROM: 130  Shoulder extension      Shoulder scaption  Initially about 30*, able to get to about 75* passive with repeated reps  P: 81*    Shoulder adduction      Shoulder internal rotation Hand to belly at 0* ABD   At 70 degrees abduction 45*   Shoulder external rotation Initially -30* at 0* ABD, able to get to about -20* passive with repeated reps  P: 38* at 45* abduction At 70 degrees abduction 35* 65 deg at 0 deg abd  Elbow flexion      Elbow extension      Wrist flexion      Wrist extension      Wrist ulnar deviation      Wrist radial deviation      Wrist pronation      Wrist supination      (Blank rows = not tested)  UPPER EXTREMITY MMT:  MMT Right eval Left eval  Shoulder flexion    Shoulder extension    Shoulder abduction    Shoulder adduction    Shoulder internal rotation    Shoulder external rotation    Middle trapezius    Lower trapezius    Elbow flexion    Elbow extension    Wrist flexion    Wrist extension    Wrist ulnar deviation    Wrist radial deviation    Wrist pronation    Wrist supination    Grip strength (lbs)    (Blank rows = not tested)  Did not check MMT at eval due to post-op precautions    PALPATION:  Scar initially restricted but got it moving well with a bit of scar massage, encouraged this at home                                                                                                                              TREATMENT DATE:  03/29/24 Pulleys flexion x1 min, scaption x 1 min, ER/IR x 1 min, horizontal abd 2x10 Supine flexion AAROM 2# rod x10 Supine flexion at end range 5x5" Supine shoulder ER AAROM 0 deg abd x10 Supiner shoulder ER AROM 0 deg abd x10, yellow TB x10 Supine "W" 2x10 Sidelying scaption x10 Sidelying horizontal shoulder abd x10 Finger wall crawl  flexion x10 focusing on decreasing UT compensation Finger wall crawl scaption x5 focusing on decreasing UT compensation Vasopnuematic device X 10 min, medium compression, 34 deg to Rt shoulder  03/25/24 MHP R shoulder x10 min (not included in billing)  PROM/stretching R shoulder for gentle mobility following heat all directions Supine flexion AAROM 1# rod x20 slow and easy  Sidelying flexion  AROM 0# x15  Sidelying ER AROM 0# x15    03/22/24 Supine shoulder flexion AAROM 1# rod 15x5 seconds Supine shoulder ER at 0 deg abd AAROM 1# rod 15x5 seconds Supine shoulder ER at 60 deg abd AAROM with scap squeeze 15x5 sec (cues to keep bicep from compensating) Supine shoulder abd AAROM with 1# rod 15x5 sec Sidelying ER 1# 2x10 Sidelying shoulder scaption 1# x10 Sidelying shoulder flexion 1# x10 (to fatigue) Standing row red TB 2x10 Standing shoulder ext red TB 2x10 Standing "W" against wall 2x10 Finger wall crawl flexion x10 focusing on decreasing UT compensation   03/18/24 Supine shoulder flexion AAROM 1# rod 15x5 seconds Attempted supine ABD AAROM- stopped due to radicular pain going down R UE  Seated ER AAROM with 1# dowel x15 towel tucked to ribs  Attempted yellow band ER standing, unable Sidelying shoulder ER 1# 2x10  Supine flexion AROM 1# x10  Supine serratus punches 0# x15  Wall ladder flexion 12x3 second holds to notch 26   03/17/2024 Scapular retraction/shoulder blade pinches 10 x 5 seconds Supine IR/ER AROM at 70 degrees abduction and elbow on multiple pillows to keep elbow slightly higher than shoulder height 20 x 10 seconds in each direction with PT assistance for correction of technique  Functional Activities: Scapular protraction/supine arm raises 20 x 3 seconds (for reaching) Pulley flexion and scaption with decompression and palms in 10 x 10 seconds each (for reaching and overhead function) Reviewed and updated home exercise program with emphasis on AROM and capsular  flexibility   03/09/2024 Therapeutic Exercise: Pt warm-up with pendulum with improved motion.   Table top slides with arm inside pillow case: flexion & scaption 10 reps 5 sec hold Supine dowel chest press Dowel exercises 5 sec hold for gentle end range stretch supine flexion 10 reps; scaption 10 reps; external rotation 10 reps Standing dowel shoulder ext 5 sec hold 10 reps AA (PT manually assisting) standing shoulder flexion with UE ranger flexion 10 reps Pulley AAROM flexion & scapation 2 min ea with PT demo & verbal cues on motion.  Patient had improved range in both planes today.  Manual Therapy: PROM right shoulder flexion, abduction and external rotation.    -Vasopnuematic device X 10 min, medium compression, 34 deg to Rt shoulder   03/07/2024 Therapeutic Exercise: Pulley AAROM flexion & scapation 2 min ea with PT demo & verbal cues on motion.   RUE Supine chest press AAROM with dowel 10 reps 2 sets RUE AAROM shoulder flexion with dowel 10 reps 2 sets RUE AAROM shoulder abduction with dowel 10 reps 2 sets RUE AAROM shoulder external rotation with dowel 10 reps 2 sets PT demo & verbal cues on pendulum exercises. Pt verbalized better understanding.  Supine RUE elbow flexion & extension with 10 reps ea with forearm neutral, pronation & supination.   Self-Care: PT verbal & demo cues on positioning RUE when seated to support weight of arm. Pt verbalized understanding & reports felt better.  PT recommending to decrease use of sling unless in crowd. Pt verbalized understanding.   PT verbal & demo cues on positioning in supine with pillow under head & neck not shoulder.   Manual Therapy: PROM right shoulder flexion, abduction and external rotation.   PROM biceps stretch with UE beside trunk & ~90* flexion.   -Vasopnuematic device X 10 min, medium compression, 34 deg to Rt shoulder  HOME EXERCISE PROGRAM: Access Code: [email or web address] URL: [email or web address] Date:  03/29/2024 Prepared by: Keithan Dileonardo April Marie Marice Guidone  Exercises - Supine Shoulder Flexion Extension AAROM with Dowel  - 1-2 x daily - 7 x weekly - 1 sets - 10 reps - 5 seconds hold - Supine Shoulder External Rotation in 45 Degrees Abduction AAROM with Dowel  - 1-2 x daily - 7 x weekly - 1 sets - 10 reps - 5 seconds hold - Standing Scapular Retraction  - 5 x daily - 7 x weekly - 1 sets - 5 reps - 5 second hold - Supine Scapular Protraction in Flexion with Dumbbells  - 2 x daily - 7 x weekly - 1 sets - 20-30 reps - 3 seconds hold - Supine Shoulder External Rotation with Resistance  - 1 x daily - 7 x weekly - 2 sets - 10 reps - Shoulder External Rotation in 45 Degrees Abduction  - 1 x daily - 7 x weekly - 2 sets - 10 reps - Supine Shoulder Flexion Extension Full Range AROM  - 1 x daily - 7 x weekly - 2 sets - 10 reps - Sidelying Shoulder Scaption  - 1 x daily - 7 x weekly - 2 sets - 10 reps   ASSESSMENT:  CLINICAL IMPRESSION: Decreased exercise intensity to decrease DOMS. Less weight provided today. Continued scapular strengthening and rotator cuff strengthen within pt tolerance. Updated pt's HEP accordingly.  OBJECTIVE IMPAIRMENTS: decreased ROM, decreased strength, increased edema, increased fascial restrictions, increased muscle spasms, impaired flexibility, impaired UE functional use, improper body mechanics, postural dysfunction, and pain.   ACTIVITY LIMITATIONS: carrying, lifting, bathing, toileting, dressing, self feeding, reach over head, hygiene/grooming, and caring for others  PARTICIPATION LIMITATIONS: meal prep, cleaning, laundry, driving, shopping, community activity, occupation, and yard work  PERSONAL FACTORS: Time since onset of injury/illness/exacerbation are also affecting patient's functional outcome.   REHAB POTENTIAL: Excellent  CLINICAL DECISION MAKING: Stable/uncomplicated  EVALUATION COMPLEXITY: Low   GOALS: Goals reviewed with patient? No  SHORT TERM GOALS:  Target date:  03/25/2024     Patient will be compliant with appropriate progressive HEP        GOAL STATUS: Met 03/17/2024  2. R shoulder flexion and ABD A/PROM to be at least 150*         GOAL STATUS: Ongoing  03/17/2024    3. R shoulder IR and ER A/PROM to be at least 45* at 90* ABD           GOAL STATUS: Ongoing  03/17/2024   4. Will be more aware of posture with all functional tasks with use of ergonomic aides PRN/as desired      GOAL STATUS: Ongoing  03/17/2024     LONG TERM GOALS: Target date: 04/22/2024    MMT in surgical shoulder to be at least 4/5 all tested groups  Baseline:  Goal status: Ongoing  03/07/2024  2.  Pain to be no more than 2/10 with all functional task performance  Baseline:  Goal status: Ongoing  03/17/2024  3.  Will be able to perform all personal care and hygiene without increase in pain or difficulty with surgical shoulder  Baseline:  Goal status: Ongoing  03/17/2024  4.  Will be able to perform all home and work based tasks and requirements without increase in pain surgical shoulder  Baseline:  Goal status: Ongoing  03/17/2024  5.  PSFS to have improved by at least 4 points  Baseline:  Goal status:  Ongoing  03/07/2024    PLAN:  PT FREQUENCY: 3x/week for first 2 weeks, then drop to 2x/week after that  PT DURATION: 8 weeks  PLANNED INTERVENTIONS: 97750- Physical Performance Testing, 97110-Therapeutic exercises, 97530- Therapeutic activity, [id number]- Neuromuscular re-education, 97535- Self Care, 21308- Manual therapy, [id number]- Aquatic Therapy, M5784- Electrical stimulation (unattended), 97016- Vasopneumatic device, [id number]- Ultrasound, 69629- Ionotophoresis 4mg /ml Dexamethasone , Patient/Family education, Taping, Dry Needling, Cryotherapy, and Moist heat  PLAN FOR NEXT SESSION: AROM and capsular flexibility emphasis.  Is she feeling better? May need to progress resistance a little more slowly, she had a lot of pain after last visit   Per MD note 03/16/24: Her  strength is in a good spot for where she is at, considering she has not started any strengthening exercises yet. She should be okay to start rotator cuff strengthening exercises now. Continue with active motion.    Rhiann Boucher April Ma L Vivan Vanderveer, PT, DPT 03/29/24 3:16 PM

## 2024-04-01 ENCOUNTER — Ambulatory Visit: Admitting: Physical Therapy

## 2024-04-01 ENCOUNTER — Encounter: Payer: Self-pay | Admitting: Physical Therapy

## 2024-04-01 DIAGNOSIS — M25611 Stiffness of right shoulder, not elsewhere classified: Secondary | ICD-10-CM

## 2024-04-01 DIAGNOSIS — M6281 Muscle weakness (generalized): Secondary | ICD-10-CM

## 2024-04-01 DIAGNOSIS — M25511 Pain in right shoulder: Secondary | ICD-10-CM

## 2024-04-01 DIAGNOSIS — R293 Abnormal posture: Secondary | ICD-10-CM

## 2024-04-01 NOTE — Therapy (Signed)
 OUTPATIENT PHYSICAL THERAPY SHOULDER TREATMENT   Patient Name: Courtney Watson MRN: [id number] DOB:[birth year], 63 y.o., female Today's Date: 04/01/2024  END OF SESSION:  PT End of Session - 04/01/24 1519     Visit Number 10    Number of Visits 19    Date for PT Re-Evaluation 04/22/24    Authorization Type [employer]    Authorization Time Period 02/26/24 to 04/22/24    PT Start Time 1516    PT Stop Time 1557    PT Time Calculation (min) 41 min    Activity Tolerance Patient tolerated treatment well    Behavior During Therapy [facility] for tasks assessed/performed                 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   Malignant tumor of colon (HCC) 04/01/2018   Neck pain 09/12/2014   Osteopenia 2018   hips and spine   Palpitations 04/01/2018   Perimenopausal disorder 04/01/2018   Seasonal allergies    Shortness of breath on exertion 06/08/2017   Ulcerative colitis (HCC)    Uterine leiomyoma 04/01/2018   Vitamin D  deficiency    Past Surgical History:  Procedure Laterality Date   BICEPT TENODESIS Right 02/02/2024   Procedure: TENODESIS, BICEPS;  Surgeon: Jasmine Mesi, MD;  Location: [facility] OR;  Service: Orthopedics;  Laterality: Right;   COLON SURGERY  2005   colon cancer--Dr.Rosenbower   ENDOMETRIAL ABLATION  2012   Dr.Lomax   KNEE ARTHROSCOPY Right    MYOMECTOMY  2012   Dr.Lomax   POSTERIOR LUMBAR FUSION 2  WITH HARDWARE REMOVAL Right 02/02/2024   Procedure: ARTHROSCOPY, SHOULDER WITH DEBRIDEMENT;  Surgeon: Jasmine Mesi, MD;  Location: [facility] OR;  Service: Orthopedics;  Laterality: Right;  PROCEDURE:  RIGHT SHOULDER ARTHROSCOPY, DEBRIDEMENT, MINI OPEN BICEPS TENODESIS & ROTATOR CUFF TEAR REPAIR -SUPRASPINATUS AND SUBSCAPULARIS   SHOULDER OPEN ROTATOR CUFF REPAIR Right 02/02/2024   Procedure: REPAIR, ROTATOR CUFF, OPEN;  Surgeon: Jasmine Mesi, MD;  Location: [facility] OR;  Service: Orthopedics;  Laterality: Right;   Patient Active Problem List   Diagnosis Date Noted   Nontraumatic tear of right rotator cuff 02/07/2024   Degenerative superior labral anterior-to-posterior (SLAP) tear of right shoulder 02/07/2024   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   Synovitis of right shoulder 02/07/2024   Biceps tendonitis on right 02/07/2024   Pain in right shoulder 01/08/2024   Inflamed seborrheic keratosis 11/17/2023   Nevus of right shoulder 11/17/2023   Onychoschizia 11/17/2023   Sun-damaged skin 11/17/2023   Encounter for annual physical exam 11/07/2022   Arthritis pain of hand 11/07/2022   High cholesterol 02/19/2022   Essential hypertension 11/21/2021   Seasonal allergies    History of chicken pox    H/O rhinoplasty    Fibroid  Eating disorder    Colon polyps    Blood in stool    Abnormal uterine bleeding    B12 deficiency 04/09/2020   Left thyroid  nodule    Upper airway cough syndrome 04/07/2019   Malignant tumor of colon (HCC) 04/01/2018   Perimenopausal disorder 04/01/2018   Uterine leiomyoma 04/01/2018   Palpitations 04/01/2018   Irregular heart beat 04/01/2018   Ulcerative colitis (HCC) 01/25/2018   Vitamin D  deficiency 01/25/2018   Fatigue 01/25/2018   Shortness of breath on exertion 06/08/2017   Knee pain, right 08/22/2015   Cervical radiculopathy 01/10/2015   Anxiety 09/12/2014   Osteopenia 09/12/2014   Neck pain 09/12/2014   Malignant neoplasm of large intestine  (HCC) 09/12/2014   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   Cancer (HCC) 2005    PCP: Allwardt, Alyssa PA-C   REFERRING PROVIDER: Jean Michaelis  REFERRING DIAG: Please see note for PT instructions  Post right shoulder arthroscopy, RCR, BT on 02/02/2024  THERAPY DIAG:  Acute pain of right shoulder  Stiffness of right shoulder, not elsewhere classified  Muscle weakness (generalized)  Abnormal posture  Rationale for Evaluation and Treatment: Rehabilitation  ONSET DATE: Surgery 02/02/24   SUBJECTIVE:                                                                                                                                                                                      SUBJECTIVE STATEMENT:  Felt a lot better after last session, having some random aches and throbs but its OK   Hand dominance: Right  PERTINENT HISTORY: Right shoulder arthroscopy RTC repair suprapspinatus & subscapularis, biceps tenodesis 02/02/24, Colon CA 2005 with sg, Brachial neuritis, cervical radiculopathy, HTN  PAIN:  Yes: NPRS scale:  1/10 Pain location: right shoulder wrapping around whole shoulder  Pain description: random aches  Aggravating factors:  nothing  Relieving factors: ice or heat, stretching   PRECAUTIONS: Plan is continue with range of motion exercises.  Start physical therapy upstairs to focus on passive and active assisted range of motion of the right shoulder (okay to start active assisted range of motion on 03/01/24).  Okay to start full active range of motion and rotator cuff strengthening exercises on 03/15/2024.  Follow-up in 3 weeks for clinical recheck and determination of return to work potentially.  RED FLAGS: None   WEIGHT BEARING RESTRICTIONS: Yes NWB surgicial shoulder   FALLS:  Has patient fallen in last 6 months? 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  DIAGNOSTIC FINDINGS:   Narrative & Impression  CLINICAL DATA:  Acute right shoulder pain. Limited range of motion. Cracking and popping.   EXAM: MRI OF THE RIGHT SHOULDER WITH CONTRAST   TECHNIQUE: Multiplanar, multisequence MR imaging of the right shoulder was performed following the administration of intra-articular contrast.   CONTRAST:  See Injection Documentation.   COMPARISON:  right shoulder radiographs 01/08/2024   FINDINGS: Rotator cuff: There is abnormal extension of gadolinium contrast into the subacromial/subdeltoid bursa. This appears to be through a full-thickness tear of the anterior supraspinatus tendon footprint measuring up to approximately 9 mm in AP dimension (sagittal series 8, image 8 and with up to only 4 mm tendon retraction (coronal series 5 and 6 images 11 through 13). The infraspinatus is intact. There is high-grade partial thickness tearing of the subscapularis tendon diffusely, with likely a full-thickness component superiorly and only minimal anterior fibers inserting inferiorly (axial images 8 through 15). The teres minor is intact.   Muscles:  Mild to moderate supraspinatus muscle atrophy.   Biceps long head: The origin of the long head of the biceps tendon appears to be intact. The biceps tendon is markedly attenuated just proximal to the bicipital groove, and there appears to be an at least 90% partial-thickness and possible full-thickness tear of the tendon as it courses over the lesser tuberosity into the bicipital groove (axial images 11  through 13). A more normal caliber biceps tendon is seen within the more distal aspect of the bicipital groove (axial images 17-20).   Acromioclavicular Joint: There are mild degenerative changes of the acromioclavicular joint including joint space narrowing, subchondral marrow edema, and peripheral osteophytosis. Type II acromion.   Glenohumeral Joint: Mild glenohumeral cartilage thinning.   Labrum: There is adequate distention of the glenohumeral joint following arthrogram injection. No glenoid labral tear is seen.   Bones:  No acute fracture.   Other: None.   IMPRESSION: 1. Full-thickness tear of the anterior supraspinatus tendon footprint measuring up to approximately 9 mm in AP dimension and with up to only 4 mm tendon retraction. Mild to moderate supraspinatus muscle atrophy. 2. High-grade partial thickness tearing of the subscapularis tendon diffusely, with likely a full-thickness component superiorly and only minimal intact anterior fibers inserting inferiorly. 3. The long head of the biceps tendon is markedly attenuated just proximal to the bicipital groove, and there appears to be an at least 90% partial-thickness and possible full-thickness tear of the tendon as it courses over the lesser tuberosity into the bicipital groove. 4. Mild degenerative changes of the acromioclavicular joint.    PATIENT SURVEYS:   Patient-Specific Activity Scoring Scheme  "0" represents "unable to perform." "10" represents "able to perform at prior level. 0 1 2 3 4 5 6 7 8 9  10 (Date and Score)   Activity Eval   1. Doing hair/make up  1   2. Cooking   3  3. Getting dressed  4  4. Pick up things  0  5. Brush teeth  4  6.  Write normally  2  Score 2.3   Total score = sum of the activity scores/number of activities Minimum detectable change (90%CI) for average score = 2  points Minimum detectable change (90%CI) for single activity score = 3 points  COGNITION: Overall cognitive  status: Within functional limits for tasks assessed     SENSATION: WFL  POSTURE: Rounded shoulders, forward head   UPPER EXTREMITY ROM:   Passive ROM Right eval Right 03/07/24 Right 03/17/2024 Right 03/22/24  Shoulder flexion Initially 40*, able to get to 90* passive with repeated reps P: 98* 95* AAROM: 130  Shoulder extension      Shoulder scaption  Initially about 30*, able to get to about 75* passive with repeated reps  P: 81*    Shoulder adduction      Shoulder internal rotation Hand to belly at 0* ABD   At 70 degrees abduction 45*   Shoulder external rotation Initially -30* at 0* ABD, able to get to about -20* passive with repeated reps  P: 38* at 45* abduction At 70 degrees abduction 35* 65 deg at 0 deg abd  Elbow flexion      Elbow extension      Wrist flexion      Wrist extension      Wrist ulnar deviation      Wrist radial deviation      Wrist pronation      Wrist supination      (Blank rows = not tested)  UPPER EXTREMITY MMT:  MMT Right eval Left eval  Shoulder flexion    Shoulder extension    Shoulder abduction    Shoulder adduction    Shoulder internal rotation    Shoulder external rotation    Middle trapezius    Lower trapezius    Elbow flexion    Elbow extension    Wrist flexion    Wrist extension    Wrist ulnar deviation    Wrist radial deviation    Wrist pronation    Wrist supination    Grip strength (lbs)    (Blank rows = not tested)  Did not check MMT at eval due to post-op precautions    PALPATION:  Scar initially restricted but got it moving well with a bit of scar massage, encouraged this at home                                                                                                                              TREATMENT DATE:   04/01/24  STM R anterior/middle/posterior delt, distal pec   Supine AAROM flexion x15 2# rod  Supine chest presses 2# 15  Supine flexion AROM 0# x12 Supine serratus punch 1# x10  Sidelying ABD AROM  to 90*only x15  Sidelying ER 1# x12   Wall ladder flexion stretches 12x5 seconds to work on functional reach [employer] ladder scaption stretches 10x5 seconds to work on functional reach Scap retractions yellow TB cues to avoid UT activation 10x2 second holds      03/29/24 Pulleys flexion x1 min, scaption x 1 min, ER/IR x 1 min, horizontal abd 2x10 Supine flexion  AAROM 2# rod x10 Supine flexion at end range 5x5" Supine shoulder ER AAROM 0 deg abd x10 Supiner shoulder ER AROM 0 deg abd x10, yellow TB x10 Supine "W" 2x10 Sidelying scaption x10 Sidelying horizontal shoulder abd x10 Finger wall crawl flexion x10 focusing on decreasing UT compensation Finger wall crawl scaption x5 focusing on decreasing UT compensation Vasopnuematic device X 10 min, medium compression, 34 deg to Rt shoulder  03/25/24 MHP R shoulder x10 min (not included in billing)  PROM/stretching R shoulder for gentle mobility following heat all directions Supine flexion AAROM 1# rod x20 slow and easy  Sidelying flexion AROM 0# x15  Sidelying ER AROM 0# x15    03/22/24 Supine shoulder flexion AAROM 1# rod 15x5 seconds Supine shoulder ER at 0 deg abd AAROM 1# rod 15x5 seconds Supine shoulder ER at 60 deg abd AAROM with scap squeeze 15x5 sec (cues to keep bicep from compensating) Supine shoulder abd AAROM with 1# rod 15x5 sec Sidelying ER 1# 2x10 Sidelying shoulder scaption 1# x10 Sidelying shoulder flexion 1# x10 (to fatigue) Standing row red TB 2x10 Standing shoulder ext red TB 2x10 Standing "W" against wall 2x10 Finger wall crawl flexion x10 focusing on decreasing UT compensation   03/18/24 Supine shoulder flexion AAROM 1# rod 15x5 seconds Attempted supine ABD AAROM- stopped due to radicular pain going down R UE  Seated ER AAROM with 1# dowel x15 towel tucked to ribs  Attempted yellow band ER standing, unable Sidelying shoulder ER 1# 2x10  Supine flexion AROM 1# x10  Supine serratus punches 0# x15  Wall  ladder flexion 12x3 second holds to notch 26   03/17/2024 Scapular retraction/shoulder blade pinches 10 x 5 seconds Supine IR/ER AROM at 70 degrees abduction and elbow on multiple pillows to keep elbow slightly higher than shoulder height 20 x 10 seconds in each direction with PT assistance for correction of technique  Functional Activities: Scapular protraction/supine arm raises 20 x 3 seconds (for reaching) Pulley flexion and scaption with decompression and palms in 10 x 10 seconds each (for reaching and overhead function) Reviewed and updated home exercise program with emphasis on AROM and capsular flexibility   03/09/2024 Therapeutic Exercise: Pt warm-up with pendulum with improved motion.   Table top slides with arm inside pillow case: flexion & scaption 10 reps 5 sec hold Supine dowel chest press Dowel exercises 5 sec hold for gentle end range stretch supine flexion 10 reps; scaption 10 reps; external rotation 10 reps Standing dowel shoulder ext 5 sec hold 10 reps AA (PT manually assisting) standing shoulder flexion with UE ranger flexion 10 reps Pulley AAROM flexion & scapation 2 min ea with PT demo & verbal cues on motion.  Patient had improved range in both planes today.  Manual Therapy: PROM right shoulder flexion, abduction and external rotation.    -Vasopnuematic device X 10 min, medium compression, 34 deg to Rt shoulder   03/07/2024 Therapeutic Exercise: Pulley AAROM flexion & scapation 2 min ea with PT demo & verbal cues on motion.   RUE Supine chest press AAROM with dowel 10 reps 2 sets RUE AAROM shoulder flexion with dowel 10 reps 2 sets RUE AAROM shoulder abduction with dowel 10 reps 2 sets RUE AAROM shoulder external rotation with dowel 10 reps 2 sets PT demo & verbal cues on pendulum exercises. Pt verbalized better understanding.  Supine RUE elbow flexion & extension with 10 reps ea with forearm neutral, pronation & supination.   Self-Care: PT verbal & demo cues  on positioning RUE when seated to support weight of arm. Pt verbalized understanding & reports felt better.  PT recommending to decrease use of sling unless in crowd. Pt verbalized understanding.   PT verbal & demo cues on positioning in supine with pillow under head & neck not shoulder.   Manual Therapy: PROM right shoulder flexion, abduction and external rotation.   PROM biceps stretch with UE beside trunk & ~90* flexion.   -Vasopnuematic device X 10 min, medium compression, 34 deg to Rt shoulder  HOME EXERCISE PROGRAM: Access Code: [email or web address] URL: [email or web address] Date: 03/29/2024 Prepared by: Gellen April Erman [place]ward  Exercises - Supine Shoulder Flexion Extension AAROM with Dowel  - 1-2 x daily - 7 x weekly - 1 sets - 10 reps - 5 seconds hold - Supine Shoulder External Rotation in 45 Degrees Abduction AAROM with Dowel  - 1-2 x daily - 7 x weekly - 1 sets - 10 reps - 5 seconds hold - Standing Scapular Retraction  - 5 x daily - 7 x weekly - 1 sets - 5 reps - 5 second hold - Supine Scapular Protraction in Flexion with Dumbbells  - 2 x daily - 7 x weekly - 1 sets - 20-30 reps - 3 seconds hold - Supine Shoulder External Rotation with Resistance  - 1 x daily - 7 x weekly - 2 sets - 10 reps - Shoulder External Rotation in 45 Degrees Abduction  - 1 x daily - 7 x weekly - 2 sets - 10 reps - Supine Shoulder Flexion Extension Full Range AROM  - 1 x daily - 7 x weekly - 2 sets - 10 reps - Sidelying Shoulder Scaption  - 1 x daily - 7 x weekly - 2 sets - 10 reps   ASSESSMENT:  CLINICAL IMPRESSION:  Noted a lot of mm spasms in deltoid and tried to address with STM- tissue had a good response and spasms seemed to improve by about 75%. We continued working on AROM and some light strengthening but was cautious with speed of progression in order to avoid triggering DOMS again.   OBJECTIVE IMPAIRMENTS: decreased ROM, decreased strength, increased edema, increased fascial  restrictions, increased muscle spasms, impaired flexibility, impaired UE functional use, improper body mechanics, postural dysfunction, and pain.   ACTIVITY LIMITATIONS: carrying, lifting, bathing, toileting, dressing, self feeding, reach over head, hygiene/grooming, and caring for others  PARTICIPATION LIMITATIONS: meal prep, cleaning, laundry, driving, shopping, community activity, occupation, and yard work  PERSONAL FACTORS: Time since onset of injury/illness/exacerbation are also affecting patient's functional outcome.   REHAB POTENTIAL: Excellent  CLINICAL DECISION MAKING: Stable/uncomplicated  EVALUATION COMPLEXITY: Low   GOALS: Goals reviewed with patient? No  SHORT TERM GOALS: Target date:  03/25/2024     Patient will be compliant with appropriate progressive HEP        GOAL STATUS: Met 03/17/2024  2. R shoulder flexion and ABD A/PROM to be at least 150*         GOAL STATUS: Ongoing  03/17/2024    3. R shoulder IR and ER A/PROM to be at least 45* at 90* ABD           GOAL STATUS: Ongoing  03/17/2024   4. Will be more aware of posture with all functional tasks with use of ergonomic aides PRN/as desired      GOAL STATUS: Ongoing  03/17/2024     LONG TERM GOALS: Target date: 04/22/2024    MMT in surgical shoulder to be at least  4/5 all tested groups  Baseline:  Goal status: Ongoing  03/07/2024  2.  Pain to be no more than 2/10 with all functional task performance  Baseline:  Goal status: Ongoing  03/17/2024  3.  Will be able to perform all personal care and hygiene without increase in pain or difficulty with surgical shoulder  Baseline:  Goal status: Ongoing  03/17/2024  4.  Will be able to perform all home and work based tasks and requirements without increase in pain surgical shoulder  Baseline:  Goal status: Ongoing  03/17/2024  5.  PSFS to have improved by at least 4 points  Baseline:  Goal status:  Ongoing  03/07/2024    PLAN:  PT FREQUENCY: 3x/week for first 2  weeks, then drop to 2x/week after that   PT DURATION: 8 weeks  PLANNED INTERVENTIONS: 97750- Physical Performance Testing, 97110-Therapeutic exercises, 97530- Therapeutic activity, [id number]- Neuromuscular re-education, 97535- Self Care, 16109- Manual therapy, [id number]- Aquatic Therapy, U0454- Electrical stimulation (unattended), 97016- Vasopneumatic device, [id number]- Ultrasound, 09811- Ionotophoresis 4mg /ml Dexamethasone , Patient/Family education, Taping, Dry Needling, Cryotherapy, and Moist heat  PLAN FOR NEXT SESSION: AROM and capsular flexibility emphasis.  May need to progress resistance slowly pending pain response   Per MD note 03/16/24: Her strength is in a good spot for where she is at, considering she has not started any strengthening exercises yet. She should be okay to start rotator cuff strengthening exercises now. Continue with active motion.   Terrel Ferries, PT, DPT 04/01/24 3:58 PM

## 2024-04-05 ENCOUNTER — Ambulatory Visit: Admitting: Physical Therapy

## 2024-04-05 DIAGNOSIS — M6281 Muscle weakness (generalized): Secondary | ICD-10-CM

## 2024-04-05 DIAGNOSIS — R293 Abnormal posture: Secondary | ICD-10-CM | POA: Diagnosis not present

## 2024-04-05 DIAGNOSIS — M25511 Pain in right shoulder: Secondary | ICD-10-CM

## 2024-04-05 DIAGNOSIS — M25611 Stiffness of right shoulder, not elsewhere classified: Secondary | ICD-10-CM

## 2024-04-05 NOTE — Therapy (Signed)
 OUTPATIENT PHYSICAL THERAPY SHOULDER TREATMENT   Patient Name: Courtney Watson MRN: [id number] DOB:03/29/1961, 63 y.o., female Today's Date: 04/05/2024  END OF SESSION:  PT End of Session - 04/05/24 1524     Visit Number 11    Number of Visits 19    Date for PT Re-Evaluation 04/22/24    Authorization Type [employer]    Authorization Time Period 02/26/24 to 04/22/24    PT Start Time 1520    PT Stop Time 1600    PT Time Calculation (min) 40 min    Activity Tolerance Patient tolerated treatment well    Behavior During Therapy [facility] for tasks assessed/performed             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   Malignant tumor of colon (HCC) 04/01/2018   Neck pain 09/12/2014   Osteopenia 2018   hips and spine   Palpitations 04/01/2018   Perimenopausal disorder 04/01/2018   Seasonal allergies    Shortness of breath on exertion 06/08/2017   Ulcerative colitis (HCC)    Uterine leiomyoma 04/01/2018   Vitamin D  deficiency    Past Surgical History:  Procedure Laterality Date   BICEPT TENODESIS Right 02/02/2024   Procedure: TENODESIS, BICEPS;  Surgeon: Jasmine Mesi, MD;  Location: [facility] OR;  Service: Orthopedics;  Laterality: Right;   COLON SURGERY  2005   colon cancer--Dr.Rosenbower   ENDOMETRIAL ABLATION  2012   Dr.Lomax   KNEE ARTHROSCOPY Right    MYOMECTOMY  2012   Dr.Lomax   POSTERIOR LUMBAR FUSION 2 WITH  HARDWARE REMOVAL Right 02/02/2024   Procedure: ARTHROSCOPY, SHOULDER WITH DEBRIDEMENT;  Surgeon: Jasmine Mesi, MD;  Location: [facility] OR;  Service: Orthopedics;  Laterality: Right;  PROCEDURE:  RIGHT SHOULDER ARTHROSCOPY, DEBRIDEMENT, MINI OPEN BICEPS TENODESIS & ROTATOR CUFF TEAR REPAIR -SUPRASPINATUS AND SUBSCAPULARIS   SHOULDER OPEN ROTATOR CUFF REPAIR Right 02/02/2024   Procedure: REPAIR, ROTATOR CUFF, OPEN;  Surgeon: Jasmine Mesi, MD;  Location: [facility] OR;  Service: Orthopedics;  Laterality: Right;   Patient Active Problem List   Diagnosis Date Noted   Nontraumatic tear of right rotator cuff 02/07/2024   Degenerative superior labral anterior-to-posterior (SLAP) tear of right shoulder 02/07/2024   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   Synovitis of right shoulder 02/07/2024   Biceps tendonitis on right 02/07/2024   Pain in right shoulder 01/08/2024   Inflamed seborrheic keratosis 11/17/2023   Nevus of right shoulder 11/17/2023   Onychoschizia 11/17/2023   Sun-damaged skin 11/17/2023   Encounter for annual physical exam 11/07/2022   Arthritis pain of hand 11/07/2022   High cholesterol 02/19/2022   Essential hypertension 11/21/2021   Seasonal allergies    History of chicken pox    H/O rhinoplasty    Fibroid    Eating disorder  Colon polyps    Blood in stool    Abnormal uterine bleeding    B12 deficiency 04/09/2020   Left thyroid  nodule    Upper airway cough syndrome 04/07/2019   Malignant tumor of colon (HCC) 04/01/2018   Perimenopausal disorder 04/01/2018   Uterine leiomyoma 04/01/2018   Palpitations 04/01/2018   Irregular heart beat 04/01/2018   Ulcerative colitis (HCC) 01/25/2018   Vitamin D  deficiency 01/25/2018   Fatigue 01/25/2018   Shortness of breath on exertion 06/08/2017   Knee pain, right 08/22/2015   Cervical radiculopathy 01/10/2015   Anxiety 09/12/2014   Osteopenia 09/12/2014   Neck pain 09/12/2014   Malignant neoplasm of large intestine (HCC)  09/12/2014   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  Rationale for Evaluation and Treatment: Rehabilitation  ONSET DATE: Surgery 02/02/24   SUBJECTIVE:                                                                                                                                                                                      SUBJECTIVE STATEMENT: Pt states her R bicep is feeling very sore today -- thinks it may be from her desk work.   Hand dominance: Right  PERTINENT HISTORY: Right shoulder arthroscopy RTC repair suprapspinatus & subscapularis, biceps tenodesis 02/02/24, Colon CA 2005 with sg, Brachial neuritis, cervical radiculopathy, HTN  PAIN:  Yes: NPRS scale:  1/10 Pain location: right shoulder wrapping around whole shoulder  Pain description: random aches  Aggravating factors:  nothing  Relieving factors: ice or heat, stretching   PRECAUTIONS: Plan is continue with range of motion exercises.  Start physical therapy upstairs to focus on passive and active assisted range of motion of the right shoulder (okay to start active assisted range of motion on 03/01/24).  Okay to start full active range of motion and rotator cuff strengthening exercises on 03/15/2024.  Follow-up in 3 weeks for clinical recheck and determination of return to work potentially.  RED FLAGS: None   WEIGHT BEARING RESTRICTIONS: Yes NWB surgicial shoulder   FALLS:  Has patient fallen in last 6 months? 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  DIAGNOSTIC FINDINGS:   Narrative & Impression  CLINICAL DATA:  Acute right shoulder pain. Limited range of motion. Cracking and popping.   EXAM: MRI OF THE RIGHT SHOULDER WITH CONTRAST   TECHNIQUE: Multiplanar, multisequence MR imaging of the right shoulder was performed following the administration of intra-articular contrast.   CONTRAST:  See Injection Documentation.   COMPARISON:  right shoulder radiographs 01/08/2024   FINDINGS: Rotator cuff: There is abnormal extension of gadolinium contrast into the subacromial/subdeltoid bursa. This appears to be through a full-thickness tear of the anterior supraspinatus tendon footprint measuring up to approximately 9 mm in AP dimension (sagittal series 8, image 8 and with up to only 4 mm tendon retraction (coronal series 5 and 6 images 11 through 13). The infraspinatus is intact. There is high-grade partial thickness tearing of the subscapularis tendon diffusely, with likely a full-thickness component superiorly and only minimal anterior fibers inserting inferiorly (axial images 8 through 15). The teres minor is intact.   Muscles:  Mild to moderate supraspinatus muscle atrophy.   Biceps long head: The origin of the long head of the biceps tendon appears to be intact. The biceps tendon is markedly attenuated just proximal to the bicipital groove, and there appears to be an at least 90% partial-thickness and possible full-thickness tear of the tendon as it courses over the lesser tuberosity into the bicipital groove (axial images 11 through 13). A more normal caliber biceps tendon is seen within the more distal aspect of the bicipital groove (axial  images 17-20).   Acromioclavicular Joint: There are mild degenerative changes of the acromioclavicular joint including joint space narrowing, subchondral marrow edema, and peripheral osteophytosis. Type II acromion.   Glenohumeral Joint: Mild glenohumeral cartilage thinning.   Labrum: There is adequate distention of the glenohumeral joint following arthrogram injection. No glenoid labral tear is seen.   Bones:  No acute fracture.   Other: None.   IMPRESSION: 1. Full-thickness tear of the anterior supraspinatus tendon footprint measuring up to approximately 9 mm in AP dimension and with up to only 4 mm tendon retraction. Mild to moderate supraspinatus muscle atrophy. 2. High-grade partial thickness tearing of the subscapularis tendon diffusely, with likely a full-thickness component superiorly and only minimal intact anterior fibers inserting inferiorly. 3. The long head of the biceps tendon is markedly attenuated just proximal to the bicipital groove, and there appears to be an at least 90% partial-thickness and possible full-thickness tear of the tendon as it courses over the lesser tuberosity into the bicipital groove. 4. Mild degenerative changes of the acromioclavicular joint.    PATIENT SURVEYS:   Patient-Specific Activity Scoring Scheme  "0" represents "unable to perform." "10" represents "able to perform at prior level. 0 1 2 3 4 5 6 7 8 9  10 (Date and Score)   Activity Eval   1. Doing hair/make up  1   2. Cooking   3  3. Getting dressed  4  4. Pick up things  0  5. Brush teeth  4  6.  Write normally  2  Score 2.3   Total score = sum of the activity scores/number of activities Minimum detectable change (90%CI) for average score = 2 points Minimum detectable change (90%CI) for single activity score = 3 points  COGNITION: Overall cognitive status: Within functional limits for  tasks assessed     SENSATION: WFL  POSTURE: Rounded shoulders, forward head    UPPER EXTREMITY ROM:   Passive ROM Right eval Right 03/07/24 Right 03/17/2024 Right 03/22/24  Shoulder flexion Initially 40*, able to get to 90* passive with repeated reps P: 98* 95* AAROM: 130  Shoulder extension      Shoulder scaption  Initially about 30*, able to get to about 75* passive with repeated reps  P: 81*    Shoulder adduction      Shoulder internal rotation Hand to belly at 0* ABD   At 70 degrees abduction 45*   Shoulder external rotation Initially -30* at 0* ABD, able to get to about -20* passive with repeated reps  P: 38* at 45* abduction At 70 degrees abduction 35* 65 deg at 0 deg abd  Elbow flexion      Elbow extension      Wrist flexion      Wrist extension      Wrist ulnar deviation      Wrist radial deviation      Wrist pronation      Wrist supination      (Blank rows = not tested)  UPPER EXTREMITY MMT:  MMT Right eval Left eval  Shoulder flexion    Shoulder extension    Shoulder abduction    Shoulder adduction    Shoulder internal rotation    Shoulder external rotation    Middle trapezius    Lower trapezius    Elbow flexion    Elbow extension    Wrist flexion    Wrist extension    Wrist ulnar deviation    Wrist radial deviation    Wrist pronation    Wrist supination    Grip strength (lbs)    (Blank rows = not tested)  Did not check MMT at eval due to post-op precautions    PALPATION:  Scar initially restricted but got it moving well with a bit of scar massage, encouraged this at home                                                                                                                              TREATMENT DATE:  04/05/24 Pulleys flexion x1 min, scaption x 1 min, horizontal abd x1 min Bicep stretch 2x30" Doorway pec stretch low and mid x30" each Standing row green TB 2x10 Standing shoulder ext green TB 2x10 Standing shoulder ER red TB 2x8 Standing shoulder IR red TB 2x8 Standing wall flexion slide x10 Standing wall scaption  slide x10 Standing wall wash horizontal abd/add x10 STM & TPR pec and bicep Manual shoulder stretch to end range scaption and ER/IR   04/01/24 STM R anterior/middle/posterior delt, distal pec   Supine AAROM flexion x15 2# rod  Supine chest presses 2# 15  Supine flexion AROM 0# x12 Supine serratus punch 1# x10  Sidelying ABD AROM to 90*only x15  Sidelying ER 1# x12  Wall ladder flexion stretches 12x5 seconds to work on functional reach [employer] ladder scaption stretches 10x5 seconds to work on functional reach Scap retractions yellow TB cues to avoid UT activation 10x2 second holds      03/29/24 Pulleys flexion x1 min, scaption x 1 min, ER/IR x 1 min, horizontal abd 2x10 Supine flexion AAROM 2# rod x10 Supine flexion at end range 5x5" Supine shoulder ER AAROM 0 deg abd x10 Supiner shoulder ER AROM 0 deg abd x10, yellow TB x10 Supine "W" 2x10 Sidelying scaption x10 Sidelying horizontal shoulder abd x10 Finger wall crawl flexion x10 focusing on decreasing UT compensation Finger wall crawl scaption x5 focusing on decreasing UT compensation Vasopnuematic device X 10 min, medium compression, 34 deg to Rt shoulder  03/25/24 MHP R shoulder x10 min (not included in billing)  PROM/stretching R shoulder for gentle mobility following heat all directions Supine flexion AAROM 1# rod x20 slow and easy  Sidelying flexion AROM 0# x15  Sidelying ER AROM 0# x15    03/22/24 Supine shoulder flexion AAROM 1# rod 15x5 seconds Supine shoulder ER at 0 deg abd AAROM 1# rod 15x5 seconds Supine shoulder ER at 60 deg abd AAROM with scap squeeze 15x5 sec (cues to keep bicep from compensating) Supine shoulder abd AAROM with 1# rod 15x5 sec Sidelying ER 1# 2x10 Sidelying shoulder scaption 1# x10 Sidelying shoulder flexion 1# x10 (to fatigue) Standing row red TB 2x10 Standing shoulder ext red TB 2x10 Standing "W" against wall 2x10 Finger wall crawl flexion x10 focusing on decreasing UT  compensation   03/18/24 Supine shoulder flexion AAROM 1# rod 15x5 seconds Attempted supine ABD AAROM- stopped due to radicular pain going down R UE  Seated ER AAROM with 1# dowel x15 towel tucked to ribs  Attempted yellow band ER standing, unable Sidelying shoulder ER 1# 2x10  Supine flexion AROM 1# x10  Supine serratus punches 0# x15  Wall ladder flexion 12x3 second holds to notch 26   03/17/2024 Scapular retraction/shoulder blade pinches 10 x 5 seconds Supine IR/ER AROM at 70 degrees abduction and elbow on multiple pillows to keep elbow slightly higher than shoulder height 20 x 10 seconds in each direction with PT assistance for correction of technique  Functional Activities: Scapular protraction/supine arm raises 20 x 3 seconds (for reaching) Pulley flexion and scaption with decompression and palms in 10 x 10 seconds each (for reaching and overhead function) Reviewed and updated home exercise program with emphasis on AROM and capsular flexibility   03/09/2024 Therapeutic Exercise: Pt warm-up with pendulum with improved motion.   Table top slides with arm inside pillow case: flexion & scaption 10 reps 5 sec hold Supine dowel chest press Dowel exercises 5 sec hold for gentle end range stretch supine flexion 10 reps; scaption 10 reps; external rotation 10 reps Standing dowel shoulder ext 5 sec hold 10 reps AA (PT manually assisting) standing shoulder flexion with UE ranger flexion 10 reps Pulley AAROM flexion & scapation 2 min ea with PT demo & verbal cues on motion.  Patient had improved range in both planes today.  Manual Therapy: PROM right shoulder flexion, abduction and external rotation.    -Vasopnuematic device X 10 min, medium compression, 34 deg to Rt shoulder   03/07/2024 Therapeutic Exercise: Pulley AAROM flexion & scapation 2 min ea with PT demo & verbal cues on motion.   RUE Supine chest press AAROM with dowel 10 reps 2 sets RUE AAROM shoulder flexion with dowel  10 reps 2 sets  RUE AAROM shoulder abduction with dowel 10 reps 2 sets RUE AAROM shoulder external rotation with dowel 10 reps 2 sets PT demo & verbal cues on pendulum exercises. Pt verbalized better understanding.  Supine RUE elbow flexion & extension with 10 reps ea with forearm neutral, pronation & supination.   Self-Care: PT verbal & demo cues on positioning RUE when seated to support weight of arm. Pt verbalized understanding & reports felt better.  PT recommending to decrease use of sling unless in crowd. Pt verbalized understanding.   PT verbal & demo cues on positioning in supine with pillow under head & neck not shoulder.   Manual Therapy: PROM right shoulder flexion, abduction and external rotation.   PROM biceps stretch with UE beside trunk & ~90* flexion.   -Vasopnuematic device X 10 min, medium compression, 34 deg to Rt shoulder  HOME EXERCISE PROGRAM: Access Code: [email or web address] URL: [email or web address] Date: 03/29/2024 Prepared by: Terrius Gentile April Erman [place]  Exercises - Supine Shoulder Flexion Extension AAROM with Dowel  - 1-2 x daily - 7 x weekly - 1 sets - 10 reps - 5 seconds hold - Supine Shoulder External Rotation in 45 Degrees Abduction AAROM with Dowel  - 1-2 x daily - 7 x weekly - 1 sets - 10 reps - 5 seconds hold - Standing Scapular Retraction  - 5 x daily - 7 x weekly - 1 sets - 5 reps - 5 second hold - Supine Scapular Protraction in Flexion with Dumbbells  - 2 x daily - 7 x weekly - 1 sets - 20-30 reps - 3 seconds hold - Supine Shoulder External Rotation with Resistance  - 1 x daily - 7 x weekly - 2 sets - 10 reps - Shoulder External Rotation in 45 Degrees Abduction  - 1 x daily - 7 x weekly - 2 sets - 10 reps - Supine Shoulder Flexion Extension Full Range AROM  - 1 x daily - 7 x weekly - 2 sets - 10 reps - Sidelying Shoulder Scaption  - 1 x daily - 7 x weekly - 2 sets - 10 reps   ASSESSMENT:  CLINICAL IMPRESSION: Increased bicep soreness today.  Demonstrated bicep and pec stretches. Able to progress scapular strengthening to green TB this session with good pt tolerance. Continued AAROM into standing today with wall wash/slide exercises -- pt fatigued quickly. Shoulder elevation appears to be progressing very well; most tight with shoulder ROM in external and internal rotation.   OBJECTIVE IMPAIRMENTS: decreased ROM, decreased strength, increased edema, increased fascial restrictions, increased muscle spasms, impaired flexibility, impaired UE functional use, improper body mechanics, postural dysfunction, and pain.   ACTIVITY LIMITATIONS: carrying, lifting, bathing, toileting, dressing, self feeding, reach over head, hygiene/grooming, and caring for others  PARTICIPATION LIMITATIONS: meal prep, cleaning, laundry, driving, shopping, community activity, occupation, and yard work  PERSONAL FACTORS: Time since onset of injury/illness/exacerbation are also affecting patient's functional outcome.   REHAB POTENTIAL: Excellent  CLINICAL DECISION MAKING: Stable/uncomplicated  EVALUATION COMPLEXITY: Low   GOALS: Goals reviewed with patient? No  SHORT TERM GOALS: Target date:  03/25/2024     Patient will be compliant with appropriate progressive HEP        GOAL STATUS: Met 03/17/2024  2. R shoulder flexion and ABD A/PROM to be at least 150*         GOAL STATUS: Ongoing  03/17/2024    3. R shoulder IR and ER A/PROM to be at least 45* at 90* ABD  GOAL STATUS: Ongoing  03/17/2024   4. Will be more aware of posture with all functional tasks with use of ergonomic aides PRN/as desired      GOAL STATUS: Ongoing  03/17/2024     LONG TERM GOALS: Target date: 04/22/2024    MMT in surgical shoulder to be at least 4/5 all tested groups  Baseline:  Goal status: Ongoing  03/07/2024  2.  Pain to be no more than 2/10 with all functional task performance  Baseline:  Goal status: Ongoing  03/17/2024  3.  Will be able to perform all personal  care and hygiene without increase in pain or difficulty with surgical shoulder  Baseline:  Goal status: Ongoing  03/17/2024  4.  Will be able to perform all home and work based tasks and requirements without increase in pain surgical shoulder  Baseline:  Goal status: Ongoing  03/17/2024  5.  PSFS to have improved by at least 4 points  Baseline:  Goal status:  Ongoing  03/07/2024    PLAN:  PT FREQUENCY: 3x/week for first 2 weeks, then drop to 2x/week after that   PT DURATION: 8 weeks  PLANNED INTERVENTIONS: 97750- Physical Performance Testing, 97110-Therapeutic exercises, 97530- Therapeutic activity, [id number]- Neuromuscular re-education, 97535- Self Care, 86578- Manual therapy, [id number]- Aquatic Therapy, I6962- Electrical stimulation (unattended), 97016- Vasopneumatic device, [id number]- Ultrasound, 95284- Ionotophoresis 4mg /ml Dexamethasone , Patient/Family education, Taping, Dry Needling, Cryotherapy, and Moist heat  PLAN FOR NEXT SESSION: Update HEP. AROM and capsular flexibility emphasis.  May need to progress resistance slowly pending pain response   Per MD note 03/16/24: Her strength is in a good spot for where she is at, considering she has not started any strengthening exercises yet. She should be okay to start rotator cuff strengthening exercises now. Continue with active motion.   Rossanna Spitzley April Ma L Jaisean Monteforte, PT, DPT 04/05/24 3:24 PM

## 2024-04-07 ENCOUNTER — Other Ambulatory Visit: Payer: Self-pay | Admitting: Physician Assistant

## 2024-04-08 ENCOUNTER — Ambulatory Visit: Admitting: Physical Therapy

## 2024-04-08 ENCOUNTER — Encounter: Payer: Self-pay | Admitting: Physical Therapy

## 2024-04-08 DIAGNOSIS — M25511 Pain in right shoulder: Secondary | ICD-10-CM

## 2024-04-08 DIAGNOSIS — M6281 Muscle weakness (generalized): Secondary | ICD-10-CM

## 2024-04-08 DIAGNOSIS — M25611 Stiffness of right shoulder, not elsewhere classified: Secondary | ICD-10-CM | POA: Diagnosis not present

## 2024-04-08 DIAGNOSIS — R293 Abnormal posture: Secondary | ICD-10-CM | POA: Diagnosis not present

## 2024-04-08 NOTE — Therapy (Signed)
 OUTPATIENT PHYSICAL THERAPY SHOULDER TREATMENT   Patient Name: Courtney Watson MRN: [id number] DOB:[birth year], 63 y.o., female Today's Date: 04/08/2024  END OF SESSION:  PT End of Session - 04/08/24 1527     Visit Number 12    Number of Visits 19    Date for PT Re-Evaluation 04/22/24    Authorization Type [employer]    Authorization Time Period 02/26/24 to 04/22/24    PT Start Time 1520    PT Stop Time 1600    PT Time Calculation (min) 40 min    Activity Tolerance Patient tolerated treatment well    Behavior During Therapy [facility] for tasks assessed/performed              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   Malignant tumor of colon (HCC) 04/01/2018   Neck pain 09/12/2014   Osteopenia 2018   hips and spine   Palpitations 04/01/2018   Perimenopausal disorder 04/01/2018   Seasonal allergies    Shortness of breath on exertion 06/08/2017   Ulcerative colitis (HCC)    Uterine leiomyoma 04/01/2018   Vitamin D  deficiency    Past Surgical History:  Procedure Laterality Date   BICEPT TENODESIS Right 02/02/2024   Procedure: TENODESIS, BICEPS;  Surgeon: Jasmine Mesi, MD;  Location: [facility] OR;  Service: Orthopedics;  Laterality: Right;   COLON SURGERY  2005   colon cancer--Dr.Rosenbower   ENDOMETRIAL ABLATION  2012   Dr.Lomax   KNEE ARTHROSCOPY Right    MYOMECTOMY  2012   Dr.Lomax   POSTERIOR LUMBAR FUSION 2 WITH  HARDWARE REMOVAL Right 02/02/2024   Procedure: ARTHROSCOPY, SHOULDER WITH DEBRIDEMENT;  Surgeon: Jasmine Mesi, MD;  Location: [facility] OR;  Service: Orthopedics;  Laterality: Right;  PROCEDURE:  RIGHT SHOULDER ARTHROSCOPY, DEBRIDEMENT, MINI OPEN BICEPS TENODESIS & ROTATOR CUFF TEAR REPAIR -SUPRASPINATUS AND SUBSCAPULARIS   SHOULDER OPEN ROTATOR CUFF REPAIR Right 02/02/2024   Procedure: REPAIR, ROTATOR CUFF, OPEN;  Surgeon: Jasmine Mesi, MD;  Location: [facility] OR;  Service: Orthopedics;  Laterality: Right;   Patient Active Problem List   Diagnosis Date Noted   Nontraumatic tear of right rotator cuff 02/07/2024   Degenerative superior labral anterior-to-posterior (SLAP) tear of right shoulder 02/07/2024   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   Synovitis of right shoulder 02/07/2024   Biceps tendonitis on right 02/07/2024   Pain in right shoulder 01/08/2024   Inflamed seborrheic keratosis 11/17/2023   Nevus of right shoulder 11/17/2023   Onychoschizia 11/17/2023   Sun-damaged skin 11/17/2023   Encounter for annual physical exam 11/07/2022   Arthritis pain of hand 11/07/2022   High cholesterol 02/19/2022   Essential hypertension 11/21/2021   Seasonal allergies    History of chicken pox    H/O rhinoplasty    Fibroid    Eating  disorder    Colon polyps    Blood in stool    Abnormal uterine bleeding    B12 deficiency 04/09/2020   Left thyroid  nodule    Upper airway cough syndrome 04/07/2019   Malignant tumor of colon (HCC) 04/01/2018   Perimenopausal disorder 04/01/2018   Uterine leiomyoma 04/01/2018   Palpitations 04/01/2018   Irregular heart beat 04/01/2018   Ulcerative colitis (HCC) 01/25/2018   Vitamin D  deficiency 01/25/2018   Fatigue 01/25/2018   Shortness of breath on exertion 06/08/2017   Knee pain, right 08/22/2015   Cervical radiculopathy 01/10/2015   Anxiety 09/12/2014   Osteopenia 09/12/2014   Neck pain 09/12/2014   Malignant neoplasm of large intestine (HCC)  09/12/2014   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   Cancer (HCC) 2005    PCP: Allwardt, Alyssa PA-C   REFERRING PROVIDER: Jean Michaelis  REFERRING DIAG: Please see note for PT instructions  Post right shoulder arthroscopy, RCR, BT on 02/02/2024  THERAPY DIAG:  Acute pain of right shoulder  Stiffness of right shoulder, not elsewhere classified  Muscle weakness (generalized)  Abnormal posture  Rationale for Evaluation and Treatment: Rehabilitation  ONSET DATE: Surgery 02/02/24   SUBJECTIVE:                                                                                                                                                                                      SUBJECTIVE STATEMENT: Pt reports no issues. Reports she has been able to reach the top of the refrigerator now.   Hand dominance: Right  PERTINENT HISTORY: Right shoulder arthroscopy RTC repair suprapspinatus & subscapularis, biceps tenodesis 02/02/24, Colon CA 2005 with sg, Brachial neuritis, cervical radiculopathy, HTN  PAIN:  Yes: NPRS scale:  1/10 Pain location: right shoulder wrapping around whole shoulder  Pain description: random aches  Aggravating factors:  nothing  Relieving factors: ice or heat, stretching   PRECAUTIONS: Plan is continue with range of motion exercises.  Start physical therapy upstairs to focus on passive and active assisted range of motion of the right shoulder (okay to start active assisted range of motion on 03/01/24).  Okay to start full active range of motion and rotator cuff strengthening exercises on 03/15/2024.  Follow-up in 3 weeks for clinical recheck and determination of return to work potentially.  RED FLAGS: None   WEIGHT BEARING RESTRICTIONS: Yes NWB surgicial shoulder   FALLS:  Has patient fallen in last 6 months? 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  DIAGNOSTIC FINDINGS:   Narrative & Impression  CLINICAL DATA:  Acute right shoulder pain. Limited range of motion. Cracking and popping.   EXAM: MRI OF THE RIGHT SHOULDER WITH CONTRAST   TECHNIQUE: Multiplanar, multisequence MR imaging of the right shoulder was performed following the administration of intra-articular contrast.   CONTRAST:  See Injection Documentation.   COMPARISON:  right shoulder radiographs 01/08/2024   FINDINGS: Rotator cuff: There is abnormal extension of gadolinium contrast into the subacromial/subdeltoid bursa. This appears to be through a full-thickness tear of the anterior supraspinatus tendon footprint measuring up to approximately 9 mm in AP dimension (sagittal series 8, image 8 and with up to only 4 mm tendon retraction (coronal series 5 and 6 images 11 through 13). The infraspinatus is intact. There is high-grade partial thickness tearing of the subscapularis tendon diffusely, with likely a full-thickness component superiorly and only minimal anterior fibers inserting inferiorly (axial images 8 through 15). The teres minor is intact.   Muscles:  Mild to moderate supraspinatus muscle atrophy.   Biceps long head: The origin of the long head of the biceps tendon appears to be intact. The biceps tendon is markedly attenuated just proximal to the bicipital groove, and there appears to be an at least 90% partial-thickness and possible full-thickness tear of the tendon as it courses over the lesser tuberosity into the bicipital groove (axial images 11  through 13). A more normal caliber biceps tendon is seen within the more distal aspect of the bicipital groove (axial images 17-20).   Acromioclavicular Joint: There are mild degenerative changes of the acromioclavicular joint including joint space narrowing, subchondral marrow edema, and peripheral osteophytosis. Type II acromion.   Glenohumeral Joint: Mild glenohumeral cartilage thinning.   Labrum: There is adequate distention of the glenohumeral joint following arthrogram injection. No glenoid labral tear is seen.   Bones:  No acute fracture.   Other: None.   IMPRESSION: 1. Full-thickness tear of the anterior supraspinatus tendon footprint measuring up to approximately 9 mm in AP dimension and with up to only 4 mm tendon retraction. Mild to moderate supraspinatus muscle atrophy. 2. High-grade partial thickness tearing of the subscapularis tendon diffusely, with likely a full-thickness component superiorly and only minimal intact anterior fibers inserting inferiorly. 3. The long head of the biceps tendon is markedly attenuated just proximal to the bicipital groove, and there appears to be an at least 90% partial-thickness and possible full-thickness tear of the tendon as it courses over the lesser tuberosity into the bicipital groove. 4. Mild degenerative changes of the acromioclavicular joint.    PATIENT SURVEYS:   Patient-Specific Activity Scoring Scheme  "0" represents "unable to perform." "10" represents "able to perform at prior level. 0 1 2 3 4 5 6 7 8 9  10 (Date and Score)   Activity Eval   1. Doing hair/make up  1   2. Cooking   3  3. Getting dressed  4  4. Pick up things  0  5. Brush teeth  4  6.  Write normally  2  Score 2.3   Total score = sum of the activity scores/number of activities Minimum detectable change (90%CI) for average score = 2 points  Minimum detectable change (90%CI) for single activity score = 3 points  COGNITION: Overall cognitive  status: Within functional limits for tasks assessed     SENSATION: WFL  POSTURE: Rounded shoulders, forward head   UPPER EXTREMITY ROM:   Passive ROM Right eval Right 03/07/24 Right 03/17/2024 Right 03/22/24  Shoulder flexion Initially 40*, able to get to 90* passive with repeated reps P: 98* 95* AAROM: 130  Shoulder extension      Shoulder scaption  Initially about 30*, able to get to about 75* passive with repeated reps  P: 81*    Shoulder adduction      Shoulder internal rotation Hand to belly at 0* ABD   At 70 degrees abduction 45*   Shoulder external rotation Initially -30* at 0* ABD, able to get to about -20* passive with repeated reps  P: 38* at 45* abduction At 70 degrees abduction 35* 65 deg at 0 deg abd  Elbow flexion      Elbow extension      Wrist flexion      Wrist extension      Wrist ulnar deviation      Wrist radial deviation      Wrist pronation      Wrist supination      (Blank rows = not tested)  UPPER EXTREMITY MMT:  MMT Right eval Left eval  Shoulder flexion    Shoulder extension    Shoulder abduction    Shoulder adduction    Shoulder internal rotation    Shoulder external rotation    Middle trapezius    Lower trapezius    Elbow flexion    Elbow extension    Wrist flexion    Wrist extension    Wrist ulnar deviation    Wrist radial deviation    Wrist pronation    Wrist supination    Grip strength (lbs)    (Blank rows = not tested)  Did not check MMT at eval due to post-op precautions    PALPATION:  Scar initially restricted but got it moving well with a bit of scar massage, encouraged this at home                                                                                                                              TREATMENT DATE:  04/08/24 UBE L1 x 3 min fwd, 3 min bwd  Bicep stretch 2x30" Doorway pec stretch low, mid, high x30" each Standing against wall "W" 2x10 Standing ER with strap 2x10 Standing hands behind head elbows apart  10x3" Standing hands behind neck, slide hands up/down head to work on doing her own hair x10 Standing IR with strap 2x10 Standing wall flexion slide x10 Standing horizontal abd/add x10 Standing scaption slide x10 Standing wall wash circles to fatigue  04/05/24 Pulleys flexion x1 min, scaption x 1 min, horizontal abd x1 min Bicep stretch 2x30" Doorway pec stretch low and mid x30" each Standing row  green TB 2x10 Standing shoulder ext green TB 2x10 Standing shoulder ER red TB 2x8 Standing shoulder IR red TB 2x8 Standing wall flexion slide x10 Standing wall scaption slide x10 Standing wall wash horizontal abd/add x10 STM & TPR pec and bicep Manual shoulder stretch to end range scaption and ER/IR   04/01/24 STM R anterior/middle/posterior delt, distal pec   Supine AAROM flexion x15 2# rod  Supine chest presses 2# 15  Supine flexion AROM 0# x12 Supine serratus punch 1# x10  Sidelying ABD AROM to 90*only x15  Sidelying ER 1# x12   Wall ladder flexion stretches 12x5 seconds to work on functional reach [employer] ladder scaption stretches 10x5 seconds to work on functional reach Scap retractions yellow TB cues to avoid UT activation 10x2 second holds      03/29/24 Pulleys flexion x1 min, scaption x 1 min, ER/IR x 1 min, horizontal abd 2x10 Supine flexion AAROM 2# rod x10 Supine flexion at end range 5x5" Supine shoulder ER AAROM 0 deg abd x10 Supiner shoulder ER AROM 0 deg abd x10, yellow TB x10 Supine "W" 2x10 Sidelying scaption x10 Sidelying horizontal shoulder abd x10 Finger wall crawl flexion x10 focusing on decreasing UT compensation Finger wall crawl scaption x5 focusing on decreasing UT compensation Vasopnuematic device X 10 min, medium compression, 34 deg to Rt shoulder  03/25/24 MHP R shoulder x10 min (not included in billing)  PROM/stretching R shoulder for gentle mobility following heat all directions Supine flexion AAROM 1# rod x20 slow and easy  Sidelying flexion  AROM 0# x15  Sidelying ER AROM 0# x15    03/22/24 Supine shoulder flexion AAROM 1# rod 15x5 seconds Supine shoulder ER at 0 deg abd AAROM 1# rod 15x5 seconds Supine shoulder ER at 60 deg abd AAROM with scap squeeze 15x5 sec (cues to keep bicep from compensating) Supine shoulder abd AAROM with 1# rod 15x5 sec Sidelying ER 1# 2x10 Sidelying shoulder scaption 1# x10 Sidelying shoulder flexion 1# x10 (to fatigue) Standing row red TB 2x10 Standing shoulder ext red TB 2x10 Standing "W" against wall 2x10 Finger wall crawl flexion x10 focusing on decreasing UT compensation   03/18/24 Supine shoulder flexion AAROM 1# rod 15x5 seconds Attempted supine ABD AAROM- stopped due to radicular pain going down R UE  Seated ER AAROM with 1# dowel x15 towel tucked to ribs  Attempted yellow band ER standing, unable Sidelying shoulder ER 1# 2x10  Supine flexion AROM 1# x10  Supine serratus punches 0# x15  Wall ladder flexion 12x3 second holds to notch 26   03/17/2024 Scapular retraction/shoulder blade pinches 10 x 5 seconds Supine IR/ER AROM at 70 degrees abduction and elbow on multiple pillows to keep elbow slightly higher than shoulder height 20 x 10 seconds in each direction with PT assistance for correction of technique  Functional Activities: Scapular protraction/supine arm raises 20 x 3 seconds (for reaching) Pulley flexion and scaption with decompression and palms in 10 x 10 seconds each (for reaching and overhead function) Reviewed and updated home exercise program with emphasis on AROM and capsular flexibility   03/09/2024 Therapeutic Exercise: Pt warm-up with pendulum with improved motion.   Table top slides with arm inside pillow case: flexion & scaption 10 reps 5 sec hold Supine dowel chest press Dowel exercises 5 sec hold for gentle end range stretch supine flexion 10 reps; scaption 10 reps; external rotation 10 reps Standing dowel shoulder ext 5 sec hold 10 reps AA (PT manually  assisting) standing shoulder flexion with  UE ranger flexion 10 reps Pulley AAROM flexion & scapation 2 min ea with PT demo & verbal cues on motion.  Patient had improved range in both planes today.  Manual Therapy: PROM right shoulder flexion, abduction and external rotation.    -Vasopnuematic device X 10 min, medium compression, 34 deg to Rt shoulder   03/07/2024 Therapeutic Exercise: Pulley AAROM flexion & scapation 2 min ea with PT demo & verbal cues on motion.   RUE Supine chest press AAROM with dowel 10 reps 2 sets RUE AAROM shoulder flexion with dowel 10 reps 2 sets RUE AAROM shoulder abduction with dowel 10 reps 2 sets RUE AAROM shoulder external rotation with dowel 10 reps 2 sets PT demo & verbal cues on pendulum exercises. Pt verbalized better understanding.  Supine RUE elbow flexion & extension with 10 reps ea with forearm neutral, pronation & supination.   Self-Care: PT verbal & demo cues on positioning RUE when seated to support weight of arm. Pt verbalized understanding & reports felt better.  PT recommending to decrease use of sling unless in crowd. Pt verbalized understanding.   PT verbal & demo cues on positioning in supine with pillow under head & neck not shoulder.   Manual Therapy: PROM right shoulder flexion, abduction and external rotation.   PROM biceps stretch with UE beside trunk & ~90* flexion.   -Vasopnuematic device X 10 min, medium compression, 34 deg to Rt shoulder  HOME EXERCISE PROGRAM: Access Code: [email or web address] URL: [email or web address] Date: 04/08/2024 Prepared by: Milany Geck April Erman Hayward  Exercises - Doorway Pec Stretch at 60 Degrees Abduction with Arm Straight  - 1 x daily - 7 x weekly - 2 sets - 30 sec hold - Doorway Pec Stretch at 60 Elevation  - 1 x daily - 7 x weekly - 2 sets - 30 sec hold - Standing Scapular Retraction  - 5 x daily - 7 x weekly - 1 sets - 5 reps - 5 second hold - Shoulder External Rotation in 45 Degrees  Abduction  - 1 x daily - 7 x weekly - 2 sets - 10 reps - Standing Overhead Shoulder External Rotation Stretch with Towel  - 1 x daily - 7 x weekly - 2 sets - 10 reps - Pec Minor Stretch  - 1 x daily - 7 x weekly - 2 sets - 10 reps - Shoulder Flexion Wall Slide with Towel  - 1 x daily - 7 x weekly - 3 sets - 10 reps - Scaption Wall Slide with Towel  - 1 x daily - 7 x weekly - 3 sets - 10 reps - Standing Shoulder Abduction Slides at Wall  - 1 x daily - 7 x weekly - 3 sets - 10 reps   ASSESSMENT:  CLINICAL IMPRESSION: Reports no issues with biceps within the past few days. Able to improve pec muscle tightness and work on [facility] mobility for IR/ER for pt able to reach the back of her head to do her hair. Good shoulder mechanics with wall providing support. Will continue to work on strengthening without compensatory movements in more gravity dependent positions.   OBJECTIVE IMPAIRMENTS: decreased ROM, decreased strength, increased edema, increased fascial restrictions, increased muscle spasms, impaired flexibility, impaired UE functional use, improper body mechanics, postural dysfunction, and pain.   ACTIVITY LIMITATIONS: carrying, lifting, bathing, toileting, dressing, self feeding, reach over head, hygiene/grooming, and caring for others  PARTICIPATION LIMITATIONS: meal prep, cleaning, laundry, driving, shopping, community activity, occupation, and yard work  PERSONAL FACTORS:  Time since onset of injury/illness/exacerbation are also affecting patient's functional outcome.   REHAB POTENTIAL: Excellent  CLINICAL DECISION MAKING: Stable/uncomplicated  EVALUATION COMPLEXITY: Low   GOALS: Goals reviewed with patient? No  SHORT TERM GOALS: Target date:  03/25/2024     Patient will be compliant with appropriate progressive HEP        GOAL STATUS: Met 03/17/2024  2. R shoulder flexion and ABD A/PROM to be at least 150*         GOAL STATUS: Ongoing  03/17/2024    3. R shoulder IR and ER A/PROM to  be at least 45* at 90* ABD           GOAL STATUS: Ongoing  03/17/2024   4. Will be more aware of posture with all functional tasks with use of ergonomic aides PRN/as desired      GOAL STATUS: Ongoing  03/17/2024     LONG TERM GOALS: Target date: 04/22/2024    MMT in surgical shoulder to be at least 4/5 all tested groups  Baseline:  Goal status: Ongoing  03/07/2024  2.  Pain to be no more than 2/10 with all functional task performance  Baseline:  Goal status: Ongoing  03/17/2024  3.  Will be able to perform all personal care and hygiene without increase in pain or difficulty with surgical shoulder  Baseline:  Goal status: Ongoing  03/17/2024  4.  Will be able to perform all home and work based tasks and requirements without increase in pain surgical shoulder  Baseline:  Goal status: Ongoing  03/17/2024  5.  PSFS to have improved by at least 4 points  Baseline:  Goal status:  Ongoing  03/07/2024    PLAN:  PT FREQUENCY: 3x/week for first 2 weeks, then drop to 2x/week after that   PT DURATION: 8 weeks  PLANNED INTERVENTIONS: 97750- Physical Performance Testing, 97110-Therapeutic exercises, 97530- Therapeutic activity, [id number]- Neuromuscular re-education, 97535- Self Care, [id number]- Manual therapy, [id number]- Aquatic Therapy, [id number]- Electrical stimulation (unattended), [id number]- Vasopneumatic device, [id number]- Ultrasound, 82956- Ionotophoresis 4mg /ml Dexamethasone , Patient/Family education, Taping, Dry Needling, Cryotherapy, and Moist heat  PLAN FOR NEXT SESSION: Update HEP. AROM and capsular flexibility emphasis. Check/update ROM. May need to progress resistance slowly pending pain response   Per MD note 03/16/24: Her strength is in a good spot for where she is at, considering she has not started any strengthening exercises yet. She should be okay to start rotator cuff strengthening exercises now. Continue with active motion.   Keiandre Cygan April Ma L Trey Gulbranson, PT, DPT 04/08/24 3:27 PM

## 2024-04-12 ENCOUNTER — Ambulatory Visit: Admitting: Physical Therapy

## 2024-04-12 DIAGNOSIS — M6281 Muscle weakness (generalized): Secondary | ICD-10-CM

## 2024-04-12 DIAGNOSIS — M25511 Pain in right shoulder: Secondary | ICD-10-CM

## 2024-04-12 DIAGNOSIS — R293 Abnormal posture: Secondary | ICD-10-CM | POA: Diagnosis not present

## 2024-04-12 DIAGNOSIS — M25611 Stiffness of right shoulder, not elsewhere classified: Secondary | ICD-10-CM

## 2024-04-12 NOTE — Therapy (Signed)
 OUTPATIENT PHYSICAL THERAPY SHOULDER TREATMENT   Patient Name: Courtney Watson MRN: [id number] DOB:[birth year], 63 y.o., female Today's Date: 04/12/2024  END OF SESSION:  PT End of Session - 04/12/24 1430     Visit Number 13    Number of Visits 19    Date for PT Re-Evaluation 04/22/24    Authorization Type [employer]    Authorization Time Period 02/26/24 to 04/22/24    PT Start Time 1431    PT Stop Time 1510    PT Time Calculation (min) 39 min    Activity Tolerance Patient tolerated treatment well    Behavior During Therapy [facility] for tasks assessed/performed              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   Malignant tumor of colon (HCC) 04/01/2018   Neck pain 09/12/2014   Osteopenia 2018   hips and spine   Palpitations 04/01/2018   Perimenopausal disorder 04/01/2018   Seasonal allergies    Shortness of breath on exertion 06/08/2017   Ulcerative colitis (HCC)    Uterine leiomyoma 04/01/2018   Vitamin D  deficiency    Past Surgical History:  Procedure Laterality Date   BICEPT TENODESIS Right 02/02/2024   Procedure: TENODESIS, BICEPS;  Surgeon: Jasmine Mesi, MD;  Location: [facility] OR;  Service: Orthopedics;  Laterality: Right;   COLON SURGERY  2005   colon cancer--Dr.Rosenbower   ENDOMETRIAL ABLATION  2012   Dr.Lomax   KNEE ARTHROSCOPY Right    MYOMECTOMY  2012   Dr.Lomax   POSTERIOR LUMBAR FUSION 2 WITH  HARDWARE REMOVAL Right 02/02/2024   Procedure: ARTHROSCOPY, SHOULDER WITH DEBRIDEMENT;  Surgeon: Jasmine Mesi, MD;  Location: [facility] OR;  Service: Orthopedics;  Laterality: Right;  PROCEDURE:  RIGHT SHOULDER ARTHROSCOPY, DEBRIDEMENT, MINI OPEN BICEPS TENODESIS & ROTATOR CUFF TEAR REPAIR -SUPRASPINATUS AND SUBSCAPULARIS   SHOULDER OPEN ROTATOR CUFF REPAIR Right 02/02/2024   Procedure: REPAIR, ROTATOR CUFF, OPEN;  Surgeon: Jasmine Mesi, MD;  Location: [facility] OR;  Service: Orthopedics;  Laterality: Right;   Patient Active Problem List   Diagnosis Date Noted   Nontraumatic tear of right rotator cuff 02/07/2024   Degenerative superior labral anterior-to-posterior (SLAP) tear of right shoulder 02/07/2024   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   Synovitis of right shoulder 02/07/2024   Biceps tendonitis on right 02/07/2024   Pain in right shoulder 01/08/2024   Inflamed seborrheic keratosis 11/17/2023   Nevus of right shoulder 11/17/2023   Onychoschizia 11/17/2023   Sun-damaged skin 11/17/2023   Encounter for annual physical exam 11/07/2022   Arthritis pain of hand 11/07/2022   High cholesterol 02/19/2022   Essential hypertension 11/21/2021   Seasonal allergies    History of chicken pox    H/O rhinoplasty    Fibroid    Eating  disorder    Colon polyps    Blood in stool    Abnormal uterine bleeding    B12 deficiency 04/09/2020   Left thyroid  nodule    Upper airway cough syndrome 04/07/2019   Malignant tumor of colon (HCC) 04/01/2018   Perimenopausal disorder 04/01/2018   Uterine leiomyoma 04/01/2018   Palpitations 04/01/2018   Irregular heart beat 04/01/2018   Ulcerative colitis (HCC) 01/25/2018   Vitamin D  deficiency 01/25/2018   Fatigue 01/25/2018   Shortness of breath on exertion 06/08/2017   Knee pain, right 08/22/2015   Cervical radiculopathy 01/10/2015   Anxiety 09/12/2014   Osteopenia 09/12/2014   Neck pain 09/12/2014   Malignant neoplasm of large intestine (HCC)  09/12/2014   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   Cancer (HCC) 2005    PCP: Allwardt, Alyssa PA-C   REFERRING PROVIDER: Jean Michaelis  REFERRING DIAG: Please see note for PT instructions  Post right shoulder arthroscopy, RCR, BT on 02/02/2024  THERAPY DIAG:  Acute pain of right shoulder  Stiffness of right shoulder, not elsewhere classified  Muscle weakness (generalized)  Abnormal posture  Rationale for Evaluation and Treatment: Rehabilitation  ONSET DATE: Surgery 02/02/24   SUBJECTIVE:                                                                                                                                                                                      SUBJECTIVE STATEMENT: Pt states she was able to get her hair in a mid high ponytail after last session.   Hand dominance: Right  PERTINENT HISTORY: Right shoulder arthroscopy RTC repair suprapspinatus & subscapularis, biceps tenodesis 02/02/24, Colon CA 2005 with sg, Brachial neuritis, cervical radiculopathy, HTN  PAIN:  Yes: NPRS scale:  1/10 Pain location: right shoulder wrapping around whole shoulder  Pain description: random aches  Aggravating factors:  nothing  Relieving factors: ice or heat, stretching   PRECAUTIONS: Plan is continue with range of motion exercises.  Start physical therapy upstairs to focus on passive and active assisted range of motion of the right shoulder (okay to start active assisted range of motion on 03/01/24).  Okay to start full active range of motion and rotator cuff strengthening exercises on 03/15/2024.  Follow-up in 3 weeks for clinical recheck and determination of return to work potentially.  RED FLAGS: None   WEIGHT BEARING RESTRICTIONS: Yes NWB surgicial shoulder   FALLS:  Has patient fallen in last 6 months? 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  DIAGNOSTIC FINDINGS:   Narrative & Impression  CLINICAL DATA:  Acute right shoulder pain. Limited range of motion. Cracking and popping.   EXAM: MRI OF THE RIGHT SHOULDER WITH CONTRAST   TECHNIQUE: Multiplanar, multisequence MR imaging of the right shoulder was performed following the administration of intra-articular contrast.   CONTRAST:  See Injection Documentation.   COMPARISON:  right shoulder radiographs 01/08/2024   FINDINGS: Rotator cuff: There is abnormal extension of gadolinium contrast into the subacromial/subdeltoid bursa. This appears to be through a full-thickness tear of the anterior supraspinatus tendon footprint measuring up to approximately 9 mm in AP dimension (sagittal series 8, image 8 and with up to only 4 mm tendon retraction (coronal series 5 and 6 images 11 through 13). The infraspinatus is intact. There is high-grade partial thickness tearing of the subscapularis tendon diffusely, with likely a full-thickness component superiorly and only minimal anterior fibers inserting inferiorly (axial images 8 through 15). The teres minor is intact.   Muscles:  Mild to moderate supraspinatus muscle atrophy.   Biceps long head: The origin of the long head of the biceps tendon appears to be intact. The biceps tendon is markedly attenuated just proximal to the bicipital groove, and there appears to be an at least 90% partial-thickness and possible full-thickness tear of the tendon as it courses over the lesser tuberosity into the bicipital groove (axial images 11 through 13).  A more normal caliber biceps tendon is seen within the more distal aspect of the bicipital groove (axial images 17-20).   Acromioclavicular Joint: There are mild degenerative changes of the acromioclavicular joint including joint space narrowing, subchondral marrow edema, and peripheral osteophytosis. Type II acromion.   Glenohumeral Joint: Mild glenohumeral cartilage thinning.   Labrum: There is adequate distention of the glenohumeral joint following arthrogram injection. No glenoid labral tear is seen.   Bones:  No acute fracture.   Other: None.   IMPRESSION: 1. Full-thickness tear of the anterior supraspinatus tendon footprint measuring up to approximately 9 mm in AP dimension and with up to only 4 mm tendon retraction. Mild to moderate supraspinatus muscle atrophy. 2. High-grade partial thickness tearing of the subscapularis tendon diffusely, with likely a full-thickness component superiorly and only minimal intact anterior fibers inserting inferiorly. 3. The long head of the biceps tendon is markedly attenuated just proximal to the bicipital groove, and there appears to be an at least 90% partial-thickness and possible full-thickness tear of the tendon as it courses over the lesser tuberosity into the bicipital groove. 4. Mild degenerative changes of the acromioclavicular joint.    PATIENT SURVEYS:   Patient-Specific Activity Scoring Scheme  "0" represents "unable to perform." "10" represents "able to perform at prior level. 0 1 2 3 4 5 6 7 8 9  10 (Date and Score)   Activity Eval   1. Doing hair/make up  1   2. Cooking   3  3. Getting dressed  4  4. Pick up things  0  5. Brush teeth  4  6.  Write normally  2  Score 2.3   Total score = sum of the activity scores/number of activities Minimum detectable change (90%CI) for average score = 2 points  Minimum detectable change (90%CI) for single activity score = 3 points  COGNITION: Overall cognitive status: Within  functional limits for tasks assessed     SENSATION: WFL  POSTURE: Rounded shoulders, forward head   UPPER EXTREMITY ROM:   Passive ROM Right eval Right 03/07/24 Right 03/17/2024 Right 03/22/24 Right 04/12/24  Shoulder flexion Initially 40*, able to get to 90* passive with repeated reps P: 98* 95* AAROM: 130 AROM: 130  Shoulder extension       Shoulder scaption  Initially about 30*, able to get to about 75* passive with repeated reps  P: 81*   AROM: 110   Shoulder adduction       Shoulder internal rotation Hand to belly at 0* ABD   At 70 degrees abduction 45*    Shoulder external rotation Initially -30* at 0* ABD, able to get to about -20* passive with repeated reps  P: 38* at 45* abduction At 70 degrees abduction 35* 65 deg at 0 deg abd AROM: 80 deg at 0 deg abd  Elbow flexion       Elbow extension       Wrist flexion       Wrist extension       Wrist ulnar deviation       Wrist radial deviation       Wrist pronation       Wrist supination       (Blank rows = not tested)  UPPER EXTREMITY MMT:  MMT Right eval Left eval  Shoulder flexion    Shoulder extension    Shoulder abduction    Shoulder adduction    Shoulder internal rotation    Shoulder external rotation    Middle trapezius    Lower trapezius    Elbow flexion    Elbow extension    Wrist flexion    Wrist extension    Wrist ulnar deviation    Wrist radial deviation    Wrist pronation    Wrist supination    Grip strength (lbs)    (Blank rows = not tested)  Did not check MMT at eval due to post-op precautions    PALPATION:  Scar initially restricted but got it moving well with a bit of scar massage, encouraged this at home                                                                                                                              TREATMENT DATE:  04/12/24 UBE L1 x 3 min fwd, 3 min bwd Doorway pec stretch low, mid, high x30" each Standing ER with strap 2x10 Standing hands behind head elbows  apart 2x10x3" Standing IR with strap 2x10 Supine shoulder flexion yellow TB 2x10 Supine shoulder abd yellow TB 2x10 Supine horizontal shoulder abd yellow TB 2x10 Supine D2 PNF shoulder yellow TB 2x10   04/08/24 UBE L1 x 3 min fwd, 3 min bwd  Bicep stretch 2x30" Doorway pec stretch low,  mid, high x30" each Standing against wall "W" 2x10 Standing ER with strap 2x10 Standing hands behind head elbows apart 10x3" Standing hands behind neck, slide hands up/down head to work on doing her own hair x10 Standing IR with strap 2x10 Standing wall flexion slide x10 Standing horizontal abd/add x10 Standing scaption slide x10 Standing wall wash circles to fatigue  04/05/24 Pulleys flexion x1 min, scaption x 1 min, horizontal abd x1 min Bicep stretch 2x30" Doorway pec stretch low and mid x30" each Standing row green TB 2x10 Standing shoulder ext green TB 2x10 Standing shoulder ER red TB 2x8 Standing shoulder IR red TB 2x8 Standing wall flexion slide x10 Standing wall scaption slide x10 Standing wall wash horizontal abd/add x10 STM & TPR pec and bicep Manual shoulder stretch to end range scaption and ER/IR   04/01/24 STM R anterior/middle/posterior delt, distal pec   Supine AAROM flexion x15 2# rod  Supine chest presses 2# 15  Supine flexion AROM 0# x12 Supine serratus punch 1# x10  Sidelying ABD AROM to 90*only x15  Sidelying ER 1# x12   Wall ladder flexion stretches 12x5 seconds to work on functional reach [employer] ladder scaption stretches 10x5 seconds to work on functional reach Scap retractions yellow TB cues to avoid UT activation 10x2 second holds    03/29/24 Pulleys flexion x1 min, scaption x 1 min, ER/IR x 1 min, horizontal abd 2x10 Supine flexion AAROM 2# rod x10 Supine flexion at end range 5x5" Supine shoulder ER AAROM 0 deg abd x10 Supiner shoulder ER AROM 0 deg abd x10, yellow TB x10 Supine "W" 2x10 Sidelying scaption x10 Sidelying horizontal shoulder abd x10 Finger  wall crawl flexion x10 focusing on decreasing UT compensation Finger wall crawl scaption x5 focusing on decreasing UT compensation Vasopnuematic device X 10 min, medium compression, 34 deg to Rt shoulder  03/25/24 MHP R shoulder x10 min (not included in billing)  PROM/stretching R shoulder for gentle mobility following heat all directions Supine flexion AAROM 1# rod x20 slow and easy  Sidelying flexion AROM 0# x15  Sidelying ER AROM 0# x15    03/22/24 Supine shoulder flexion AAROM 1# rod 15x5 seconds Supine shoulder ER at 0 deg abd AAROM 1# rod 15x5 seconds Supine shoulder ER at 60 deg abd AAROM with scap squeeze 15x5 sec (cues to keep bicep from compensating) Supine shoulder abd AAROM with 1# rod 15x5 sec Sidelying ER 1# 2x10 Sidelying shoulder scaption 1# x10 Sidelying shoulder flexion 1# x10 (to fatigue) Standing row red TB 2x10 Standing shoulder ext red TB 2x10 Standing "W" against wall 2x10 Finger wall crawl flexion x10 focusing on decreasing UT compensation   03/18/24 Supine shoulder flexion AAROM 1# rod 15x5 seconds Attempted supine ABD AAROM- stopped due to radicular pain going down R UE  Seated ER AAROM with 1# dowel x15 towel tucked to ribs  Attempted yellow band ER standing, unable Sidelying shoulder ER 1# 2x10  Supine flexion AROM 1# x10  Supine serratus punches 0# x15  Wall ladder flexion 12x3 second holds to notch 26   HOME EXERCISE PROGRAM: Access Code: [email or web address] URL: [email or web address] Date: 04/08/2024 Prepared by: Clydean Posas April Marie Debbi Strandberg  Exercises - Doorway Pec Stretch at 60 Degrees Abduction with Arm Straight  - 1 x daily - 7 x weekly - 2 sets - 30 sec hold - Doorway Pec Stretch at 60 Elevation  - 1 x daily - 7 x weekly - 2 sets - 30 sec hold - Standing Scapular Retraction  - 5  x daily - 7 x weekly - 1 sets - 5 reps - 5 second hold - Shoulder External Rotation in 45 Degrees Abduction  - 1 x daily - 7 x weekly - 2 sets - 10 reps -  Standing Overhead Shoulder External Rotation Stretch with Towel  - 1 x daily - 7 x weekly - 2 sets - 10 reps - Pec Minor Stretch  - 1 x daily - 7 x weekly - 2 sets - 10 reps - Shoulder Flexion Wall Slide with Towel  - 1 x daily - 7 x weekly - 3 sets - 10 reps - Scaption Wall Slide with Towel  - 1 x daily - 7 x weekly - 3 sets - 10 reps - Standing Shoulder Abduction Slides at Wall  - 1 x daily - 7 x weekly - 3 sets - 10 reps   ASSESSMENT:  CLINICAL IMPRESSION: Pt is now able to reach behind her head to put her hair in a ponytail. Focused a lot more on progressive strengthening today. Pt with improving AROM against gravity.   OBJECTIVE IMPAIRMENTS: decreased ROM, decreased strength, increased edema, increased fascial restrictions, increased muscle spasms, impaired flexibility, impaired UE functional use, improper body mechanics, postural dysfunction, and pain.   ACTIVITY LIMITATIONS: carrying, lifting, bathing, toileting, dressing, self feeding, reach over head, hygiene/grooming, and caring for others  PARTICIPATION LIMITATIONS: meal prep, cleaning, laundry, driving, shopping, community activity, occupation, and yard work  PERSONAL FACTORS: Time since onset of injury/illness/exacerbation are also affecting patient's functional outcome.   REHAB POTENTIAL: Excellent  CLINICAL DECISION MAKING: Stable/uncomplicated  EVALUATION COMPLEXITY: Low   GOALS: Goals reviewed with patient? No  SHORT TERM GOALS: Target date:  03/25/2024     Patient will be compliant with appropriate progressive HEP        GOAL STATUS: Met 03/17/2024  2. R shoulder flexion and ABD A/PROM to be at least 150*         GOAL STATUS: Ongoing  03/17/2024    3. R shoulder IR and ER A/PROM to be at least 45* at 90* ABD           GOAL STATUS: Ongoing  03/17/2024   4. Will be more aware of posture with all functional tasks with use of ergonomic aides PRN/as desired      GOAL STATUS: Ongoing  03/17/2024     LONG TERM  GOALS: Target date: 04/22/2024    MMT in surgical shoulder to be at least 4/5 all tested groups  Baseline:  Goal status: Ongoing  03/07/2024  2.  Pain to be no more than 2/10 with all functional task performance  Baseline:  Goal status: Ongoing  03/17/2024  3.  Will be able to perform all personal care and hygiene without increase in pain or difficulty with surgical shoulder  Baseline:  Goal status: Ongoing  03/17/2024  4.  Will be able to perform all home and work based tasks and requirements without increase in pain surgical shoulder  Baseline:  Goal status: Ongoing  03/17/2024  5.  PSFS to have improved by at least 4 points  Baseline:  Goal status:  Ongoing  03/07/2024    PLAN:  PT FREQUENCY: 3x/week for first 2 weeks, then drop to 2x/week after that   PT DURATION: 8 weeks  PLANNED INTERVENTIONS: 97750- Physical Performance Testing, 97110-Therapeutic exercises, 97530- Therapeutic activity, [id number]- Neuromuscular re-education, 97535- Self Care, 16109- Manual therapy, [id number]- Aquatic Therapy, [id number]- Electrical stimulation (unattended), [id number]- Vasopneumatic device, [id number]- Ultrasound, [id number]- Ionotophoresis  4mg /ml Dexamethasone , Patient/Family education, Taping, Dry Needling, Cryotherapy, and Moist heat  PLAN FOR NEXT SESSION: Update HEP. AROM and capsular flexibility emphasis. Check/update ROM. May need to progress resistance slowly pending pain response   Per MD note 03/16/24: Her strength is in a good spot for where she is at, considering she has not started any strengthening exercises yet. She should be okay to start rotator cuff strengthening exercises now. Continue with active motion.   Dachelle Molzahn April Ma L Quorra Rosene, PT, DPT 04/12/24 2:31 PM

## 2024-04-20 ENCOUNTER — Ambulatory Visit: Admitting: Physical Therapy

## 2024-04-20 ENCOUNTER — Encounter: Payer: Self-pay | Admitting: Physical Therapy

## 2024-04-20 DIAGNOSIS — M6281 Muscle weakness (generalized): Secondary | ICD-10-CM

## 2024-04-20 DIAGNOSIS — R293 Abnormal posture: Secondary | ICD-10-CM | POA: Diagnosis not present

## 2024-04-20 DIAGNOSIS — M25611 Stiffness of right shoulder, not elsewhere classified: Secondary | ICD-10-CM | POA: Diagnosis not present

## 2024-04-20 DIAGNOSIS — M25511 Pain in right shoulder: Secondary | ICD-10-CM | POA: Diagnosis not present

## 2024-04-20 NOTE — Therapy (Signed)
 OUTPATIENT PHYSICAL THERAPY SHOULDER TREATMENT   Patient Name: Courtney Watson MRN: [id number] DOB:Jul 20, 1961, 63 y.o., female Today's Date: 04/20/2024  END OF SESSION:  PT End of Session - 04/20/24 1513     Visit Number 14    Number of Visits 19    Date for PT Re-Evaluation 04/22/24    Authorization Type [employer]    Authorization Time Period 02/26/24 to 04/22/24    PT Start Time 1513    PT Stop Time 1553    PT Time Calculation (min) 40 min    Activity Tolerance Patient tolerated treatment well    Behavior During Therapy [facility] for tasks assessed/performed               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   Malignant tumor of colon (HCC) 04/01/2018   Neck pain 09/12/2014   Osteopenia 2018   hips and spine   Palpitations 04/01/2018   Perimenopausal disorder 04/01/2018   Seasonal allergies    Shortness of breath on exertion 06/08/2017   Ulcerative colitis (HCC)    Uterine leiomyoma 04/01/2018   Vitamin D  deficiency    Past Surgical History:  Procedure Laterality Date   BICEPT TENODESIS Right 02/02/2024   Procedure: TENODESIS, BICEPS;  Surgeon: Jasmine Mesi, MD;  Location: [facility] OR;  Service: Orthopedics;  Laterality: Right;   COLON SURGERY  2005   colon cancer--Dr.Rosenbower   ENDOMETRIAL ABLATION  2012   Dr.Lomax   KNEE ARTHROSCOPY Right    MYOMECTOMY  2012   Dr.Lomax   POSTERIOR LUMBAR FUSION 2 WITH  HARDWARE REMOVAL Right 02/02/2024   Procedure: ARTHROSCOPY, SHOULDER WITH DEBRIDEMENT;  Surgeon: Jasmine Mesi, MD;  Location: [facility] OR;  Service: Orthopedics;  Laterality: Right;  PROCEDURE:  RIGHT SHOULDER ARTHROSCOPY, DEBRIDEMENT, MINI OPEN BICEPS TENODESIS & ROTATOR CUFF TEAR REPAIR -SUPRASPINATUS AND SUBSCAPULARIS   SHOULDER OPEN ROTATOR CUFF REPAIR Right 02/02/2024   Procedure: REPAIR, ROTATOR CUFF, OPEN;  Surgeon: Jasmine Mesi, MD;  Location: [facility] OR;  Service: Orthopedics;  Laterality: Right;   Patient Active Problem List   Diagnosis Date Noted   Nontraumatic tear of right rotator cuff 02/07/2024   Degenerative superior labral anterior-to-posterior (SLAP) tear of right shoulder 02/07/2024   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   Synovitis of right shoulder 02/07/2024   Biceps tendonitis on right 02/07/2024   Pain in right shoulder 01/08/2024   Inflamed seborrheic keratosis 11/17/2023   Nevus of right shoulder 11/17/2023   Onychoschizia 11/17/2023   Sun-damaged skin 11/17/2023   Encounter for annual physical exam 11/07/2022   Arthritis pain of hand 11/07/2022   High cholesterol 02/19/2022   Essential hypertension 11/21/2021   Seasonal allergies    History of chicken pox    H/O rhinoplasty    Fibroid  Eating disorder    Colon polyps    Blood in stool    Abnormal uterine bleeding    B12 deficiency 04/09/2020   Left thyroid  nodule    Upper airway cough syndrome 04/07/2019   Malignant tumor of colon (HCC) 04/01/2018   Perimenopausal disorder 04/01/2018   Uterine leiomyoma 04/01/2018   Palpitations 04/01/2018   Irregular heart beat 04/01/2018   Ulcerative colitis (HCC) 01/25/2018   Vitamin D  deficiency 01/25/2018   Fatigue 01/25/2018   Shortness of breath on exertion 06/08/2017   Knee pain, right 08/22/2015   Cervical radiculopathy 01/10/2015   Anxiety 09/12/2014   Osteopenia 09/12/2014   Neck pain 09/12/2014   Malignant neoplasm of large intestine (HCC)  09/12/2014   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   Cancer (HCC) 2005    PCP: Allwardt, Alyssa PA-C   REFERRING PROVIDER: Jean Michaelis  REFERRING DIAG: Please see note for PT instructions  Post right shoulder arthroscopy, RCR, BT on 02/02/2024  THERAPY DIAG:  Acute pain of right shoulder  Stiffness of right shoulder, not elsewhere classified  Muscle weakness (generalized)  Abnormal posture  Rationale for Evaluation and Treatment: Rehabilitation  ONSET DATE: Surgery 02/02/24   SUBJECTIVE:                                                                                                                                                                                      SUBJECTIVE STATEMENT: Had some increased pain around Rt bicep that lasted a couple of days after making beds.  Improved now.   Hand dominance: Right  PERTINENT HISTORY: Right shoulder arthroscopy RTC repair suprapspinatus & subscapularis, biceps tenodesis 02/02/24, Colon CA 2005 with sg, Brachial neuritis, cervical radiculopathy, HTN  PAIN:  Yes: NPRS scale:  1/10 Pain location: right shoulder wrapping around whole shoulder  Pain description: random aches  Aggravating factors:  nothing  Relieving factors: ice or heat, stretching   PRECAUTIONS: Plan is continue with range of motion exercises.  Start physical therapy upstairs to focus on passive and active assisted range of motion of the right shoulder (okay to start active assisted range of motion on 03/01/24).  Okay to start full active range of motion and rotator cuff strengthening exercises on 03/15/2024.  Follow-up in 3 weeks for clinical recheck and determination of return to work potentially.  RED FLAGS: None   WEIGHT BEARING RESTRICTIONS: Yes NWB surgicial shoulder   FALLS:  Has patient fallen in last 6 months? 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  DIAGNOSTIC FINDINGS:   Narrative & Impression  CLINICAL DATA:  Acute right shoulder pain. Limited range of motion. Cracking and popping.   EXAM: MRI OF THE RIGHT SHOULDER WITH CONTRAST   TECHNIQUE: Multiplanar, multisequence MR imaging of the right shoulder was performed following the administration of intra-articular contrast.   CONTRAST:  See Injection Documentation.   COMPARISON:  right shoulder radiographs 01/08/2024   FINDINGS: Rotator cuff: There is abnormal extension of gadolinium contrast into the subacromial/subdeltoid bursa. This appears to be through a full-thickness tear of the anterior supraspinatus tendon footprint measuring up to approximately 9 mm in AP dimension (sagittal series 8, image 8 and with up to only 4 mm tendon retraction (coronal series 5 and 6 images 11 through 13). The infraspinatus is intact. There is high-grade partial thickness tearing of the subscapularis tendon diffusely, with likely a full-thickness component superiorly and only minimal anterior fibers inserting inferiorly (axial images 8 through 15). The teres minor is intact.   Muscles:  Mild to moderate supraspinatus muscle atrophy.   Biceps long head: The origin of the long head of the biceps tendon appears to be intact. The biceps tendon is markedly attenuated just proximal to the bicipital groove, and there appears to be an at least 90% partial-thickness and possible full-thickness tear of the tendon as it courses over the lesser tuberosity into the bicipital groove (axial  images 11 through 13). A more normal caliber biceps tendon is seen within the more distal aspect of the bicipital groove (axial images 17-20).   Acromioclavicular Joint: There are mild degenerative changes of the acromioclavicular joint including joint space narrowing, subchondral marrow edema, and peripheral osteophytosis. Type II acromion.   Glenohumeral Joint: Mild glenohumeral cartilage thinning.   Labrum: There is adequate distention of the glenohumeral joint following arthrogram injection. No glenoid labral tear is seen.   Bones:  No acute fracture.   Other: None.   IMPRESSION: 1. Full-thickness tear of the anterior supraspinatus tendon footprint measuring up to approximately 9 mm in AP dimension and with up to only 4 mm tendon retraction. Mild to moderate supraspinatus muscle atrophy. 2. High-grade partial thickness tearing of the subscapularis tendon diffusely, with likely a full-thickness component superiorly and only minimal intact anterior fibers inserting inferiorly. 3. The long head of the biceps tendon is markedly attenuated just proximal to the bicipital groove, and there appears to be an at least 90% partial-thickness and possible full-thickness tear of the tendon as it courses over the lesser tuberosity into the bicipital groove. 4. Mild degenerative changes of the acromioclavicular joint.    PATIENT SURVEYS:   Patient-Specific Activity Scoring Scheme  "0" represents "unable to perform." "10" represents "able to perform at prior level. 0 1 2 3 4 5 6 7 8 9  10 (Date and Score)   Activity Eval   1. Doing hair/make up  1   2. Cooking   3  3. Getting dressed  4  4. Pick up things  0  5. Brush teeth  4  6.  Write normally  2  Score 2.3   Total score = sum of the activity scores/number of activities Minimum detectable change (90%CI) for average score =  2 points Minimum detectable change (90%CI) for single activity score = 3 points  COGNITION: Overall  cognitive status: Within functional limits for tasks assessed     SENSATION: WFL  POSTURE: Rounded shoulders, forward head   UPPER EXTREMITY ROM:   Passive ROM Right eval Right 03/07/24 Right 03/17/2024 Right 03/22/24 Right 04/12/24  Shoulder flexion Initially 40*, able to get to 90* passive with repeated reps P: 98* 95* AAROM: 130 AROM: 130  Shoulder extension       Shoulder scaption  Initially about 30*, able to get to about 75* passive with repeated reps  P: 81*   AROM: 110   Shoulder adduction       Shoulder internal rotation Hand to belly at 0* ABD   At 70 degrees abduction 45*    Shoulder external rotation Initially -30* at 0* ABD, able to get to about -20* passive with repeated reps  P: 38* at 45* abduction At 70 degrees abduction 35* 65 deg at 0 deg abd AROM: 80 deg at 0 deg abd  Elbow flexion       Elbow extension       Wrist flexion       Wrist extension       Wrist ulnar deviation       Wrist radial deviation       Wrist pronation       Wrist supination       (Blank rows = not tested)  UPPER EXTREMITY MMT:  MMT Right eval Left eval  Shoulder flexion    Shoulder extension    Shoulder abduction    Shoulder adduction    Shoulder internal rotation    Shoulder external rotation    Middle trapezius    Lower trapezius    Elbow flexion    Elbow extension    Wrist flexion    Wrist extension    Wrist ulnar deviation    Wrist radial deviation    Wrist pronation    Wrist supination    Grip strength (lbs)    (Blank rows = not tested)  Did not check MMT at eval due to post-op precautions    PALPATION:  Scar initially restricted but got it moving well with a bit of scar massage, encouraged this at home                                                                                                                              TREATMENT DATE:  04/20/24 TherEx UBE L3 x 6 in (3 min each direction) Rows L2 band 2x15; 5 sec hold Shoulder extension L2 band 2x15 Shoulder  ER/IR L1 band 2x15  TherAct Wall ladder flexion and scaption with 1# wrist weight x 10 each direction; 5 sec hold for motion Multidirectional wall wash 1# x 10 reps each direction Standing shoulder flexion 1# 2x10   04/12/24 UBE L1 x 3 min fwd, 3 min bwd Doorway pec stretch low, mid,  high x30" each Standing ER with strap 2x10 Standing hands behind head elbows apart 2x10x3" Standing IR with strap 2x10 Supine shoulder flexion yellow TB 2x10 Supine shoulder abd yellow TB 2x10 Supine horizontal shoulder abd yellow TB 2x10 Supine D2 PNF shoulder yellow TB 2x10   04/08/24 UBE L1 x 3 min fwd, 3 min bwd  Bicep stretch 2x30" Doorway pec stretch low, mid, high x30" each Standing against wall "W" 2x10 Standing ER with strap 2x10 Standing hands behind head elbows apart 10x3" Standing hands behind neck, slide hands up/down head to work on doing her own hair x10 Standing IR with strap 2x10 Standing wall flexion slide x10 Standing horizontal abd/add x10 Standing scaption slide x10 Standing wall wash circles to fatigue  04/05/24 Pulleys flexion x1 min, scaption x 1 min, horizontal abd x1 min Bicep stretch 2x30" Doorway pec stretch low and mid x30" each Standing row green TB 2x10 Standing shoulder ext green TB 2x10 Standing shoulder ER red TB 2x8 Standing shoulder IR red TB 2x8 Standing wall flexion slide x10 Standing wall scaption slide x10 Standing wall wash horizontal abd/add x10 STM & TPR pec and bicep Manual shoulder stretch to end range scaption and ER/IR   04/01/24 STM R anterior/middle/posterior delt, distal pec   Supine AAROM flexion x15 2# rod  Supine chest presses 2# 15  Supine flexion AROM 0# x12 Supine serratus punch 1# x10  Sidelying ABD AROM to 90*only x15  Sidelying ER 1# x12   Wall ladder flexion stretches 12x5 seconds to work on functional reach [employer] ladder scaption stretches 10x5 seconds to work on functional reach Scap retractions yellow TB cues to avoid  UT activation 10x2 second holds    HOME EXERCISE PROGRAM: Access Code: [email or web address] URL: [email or web address] Date: 04/08/2024 Prepared by: Gellen April Erman Hayward  Exercises - Doorway Pec Stretch at 60 Degrees Abduction with Arm Straight  - 1 x daily - 7 x weekly - 2 sets - 30 sec hold - Doorway Pec Stretch at 60 Elevation  - 1 x daily - 7 x weekly - 2 sets - 30 sec hold - Standing Scapular Retraction  - 5 x daily - 7 x weekly - 1 sets - 5 reps - 5 second hold - Shoulder External Rotation in 45 Degrees Abduction  - 1 x daily - 7 x weekly - 2 sets - 10 reps - Standing Overhead Shoulder External Rotation Stretch with Towel  - 1 x daily - 7 x weekly - 2 sets - 10 reps - Pec Minor Stretch  - 1 x daily - 7 x weekly - 2 sets - 10 reps - Shoulder Flexion Wall Slide with Towel  - 1 x daily - 7 x weekly - 3 sets - 10 reps - Scaption Wall Slide with Towel  - 1 x daily - 7 x weekly - 3 sets - 10 reps - Standing Shoulder Abduction Slides at Wall  - 1 x daily - 7 x weekly - 3 sets - 10 reps   ASSESSMENT:  CLINICAL IMPRESSION: Session focused on light strengthening activities which she tolerated well.  Will continue to benefit from PT to maximize function as light weights are still challenging for pt.   OBJECTIVE IMPAIRMENTS: decreased ROM, decreased strength, increased edema, increased fascial restrictions, increased muscle spasms, impaired flexibility, impaired UE functional use, improper body mechanics, postural dysfunction, and pain.   ACTIVITY LIMITATIONS: carrying, lifting, bathing, toileting, dressing, self feeding, reach over head, hygiene/grooming, and caring for others  PARTICIPATION LIMITATIONS: meal  prep, cleaning, laundry, driving, shopping, community activity, occupation, and yard work  PERSONAL FACTORS: Time since onset of injury/illness/exacerbation are also affecting patient's functional outcome.   REHAB POTENTIAL: Excellent  CLINICAL DECISION MAKING:  Stable/uncomplicated  EVALUATION COMPLEXITY: Low   GOALS: Goals reviewed with patient? No  SHORT TERM GOALS: Target date:  03/25/2024     Patient will be compliant with appropriate progressive HEP        GOAL STATUS: Met 03/17/2024  2. R shoulder flexion and ABD A/PROM to be at least 150*         GOAL STATUS: Ongoing  03/17/2024    3. R shoulder IR and ER A/PROM to be at least 45* at 90* ABD           GOAL STATUS: Ongoing  03/17/2024   4. Will be more aware of posture with all functional tasks with use of ergonomic aides PRN/as desired      GOAL STATUS: Ongoing  03/17/2024     LONG TERM GOALS: Target date: 04/22/2024    MMT in surgical shoulder to be at least 4/5 all tested groups  Baseline:  Goal status: Ongoing  03/07/2024  2.  Pain to be no more than 2/10 with all functional task performance  Baseline:  Goal status: Ongoing  03/17/2024  3.  Will be able to perform all personal care and hygiene without increase in pain or difficulty with surgical shoulder  Baseline:  Goal status: Ongoing  03/17/2024  4.  Will be able to perform all home and work based tasks and requirements without increase in pain surgical shoulder  Baseline:  Goal status: Ongoing  03/17/2024  5.  PSFS to have improved by at least 4 points  Baseline:  Goal status:  Ongoing  03/07/2024    PLAN:  PT FREQUENCY: 3x/week for first 2 weeks, then drop to 2x/week after that   PT DURATION: 8 weeks  PLANNED INTERVENTIONS: 97750- Physical Performance Testing, 97110-Therapeutic exercises, 97530- Therapeutic activity, [id number]- Neuromuscular re-education, 97535- Self Care, 62952- Manual therapy, [id number]- Aquatic Therapy, [id number]- Electrical stimulation (unattended), 97016- Vasopneumatic device, [id number]- Ultrasound, 44010- Ionotophoresis 4mg /ml Dexamethasone , Patient/Family education, Taping, Dry Needling, Cryotherapy, and Moist heat  PLAN FOR NEXT SESSION: will need recert at next visit;  Update HEP. AROM and capsular  flexibility emphasis. Check/update ROM. May need to progress resistance slowly pending pain response   Per MD note 03/16/24: Her strength is in a good spot for where she is at, considering she has not started any strengthening exercises yet. She should be okay to start rotator cuff strengthening exercises now. Continue with active motion.     Marley Simmers, PT, DPT 04/20/24 3:57 PM

## 2024-04-21 ENCOUNTER — Encounter: Payer: Self-pay | Admitting: Physical Therapy

## 2024-04-21 ENCOUNTER — Ambulatory Visit: Admitting: Physical Therapy

## 2024-04-21 DIAGNOSIS — R293 Abnormal posture: Secondary | ICD-10-CM | POA: Diagnosis not present

## 2024-04-21 DIAGNOSIS — M25511 Pain in right shoulder: Secondary | ICD-10-CM

## 2024-04-21 DIAGNOSIS — M6281 Muscle weakness (generalized): Secondary | ICD-10-CM

## 2024-04-21 DIAGNOSIS — M25611 Stiffness of right shoulder, not elsewhere classified: Secondary | ICD-10-CM

## 2024-04-21 NOTE — Therapy (Signed)
 OUTPATIENT PHYSICAL THERAPY SHOULDER TREATMENT   Patient Name: Courtney Watson MRN: [id number] DOB:1961-02-04, 63 y.o., female Today's Date: 04/21/2024  END OF SESSION:  PT End of Session - 04/21/24 1515     Visit Number 15    Number of Visits 19    Date for PT Re-Evaluation 04/22/24    Authorization Type [employer]    Authorization Time Period 02/26/24 to 04/22/24    PT Start Time 1515    PT Stop Time 1555    PT Time Calculation (min) 40 min    Activity Tolerance Patient tolerated treatment well    Behavior During Therapy [facility] for tasks assessed/performed             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   Malignant tumor of colon (HCC) 04/01/2018   Neck pain 09/12/2014   Osteopenia 2018   hips and spine   Palpitations 04/01/2018   Perimenopausal disorder 04/01/2018   Seasonal allergies    Shortness of breath on exertion 06/08/2017   Ulcerative colitis (HCC)    Uterine leiomyoma 04/01/2018   Vitamin D  deficiency    Past Surgical History:  Procedure Laterality Date   BICEPT TENODESIS Right 02/02/2024   Procedure: TENODESIS, BICEPS;  Surgeon: Jasmine Mesi, MD;  Location: [facility] OR;  Service: Orthopedics;  Laterality: Right;   COLON SURGERY  2005   colon cancer--Dr.Rosenbower   ENDOMETRIAL ABLATION  2012   Dr.Lomax   KNEE ARTHROSCOPY Right    MYOMECTOMY  2012   Dr.Lomax   POSTERIOR LUMBAR FUSION 2 WITH  HARDWARE REMOVAL Right 02/02/2024   Procedure: ARTHROSCOPY, SHOULDER WITH DEBRIDEMENT;  Surgeon: Jasmine Mesi, MD;  Location: [facility] OR;  Service: Orthopedics;  Laterality: Right;  PROCEDURE:  RIGHT SHOULDER ARTHROSCOPY, DEBRIDEMENT, MINI OPEN BICEPS TENODESIS & ROTATOR CUFF TEAR REPAIR -SUPRASPINATUS AND SUBSCAPULARIS   SHOULDER OPEN ROTATOR CUFF REPAIR Right 02/02/2024   Procedure: REPAIR, ROTATOR CUFF, OPEN;  Surgeon: Jasmine Mesi, MD;  Location: [facility] OR;  Service: Orthopedics;  Laterality: Right;   Patient Active Problem List   Diagnosis Date Noted   Nontraumatic tear of right rotator cuff 02/07/2024   Degenerative superior labral anterior-to-posterior (SLAP) tear of right shoulder 02/07/2024   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   Synovitis of right shoulder 02/07/2024   Biceps tendonitis on right 02/07/2024   Pain in right shoulder 01/08/2024   Inflamed seborrheic keratosis 11/17/2023   Nevus of right shoulder 11/17/2023   Onychoschizia 11/17/2023   Sun-damaged skin 11/17/2023   Encounter for annual physical exam 11/07/2022   Arthritis pain of hand 11/07/2022   High cholesterol 02/19/2022   Essential hypertension 11/21/2021   Seasonal allergies    History of chicken pox    H/O rhinoplasty    Fibroid    Eating disorder  Colon polyps    Blood in stool    Abnormal uterine bleeding    B12 deficiency 04/09/2020   Left thyroid  nodule    Upper airway cough syndrome 04/07/2019   Malignant tumor of colon (HCC) 04/01/2018   Perimenopausal disorder 04/01/2018   Uterine leiomyoma 04/01/2018   Palpitations 04/01/2018   Irregular heart beat 04/01/2018   Ulcerative colitis (HCC) 01/25/2018   Vitamin D  deficiency 01/25/2018   Fatigue 01/25/2018   Shortness of breath on exertion 06/08/2017   Knee pain, right 08/22/2015   Cervical radiculopathy 01/10/2015   Anxiety 09/12/2014   Osteopenia 09/12/2014   Neck pain 09/12/2014   Malignant neoplasm of large intestine (HCC)  09/12/2014   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   Cancer (HCC) 2005    PCP: Allwardt, Alyssa PA-C   REFERRING PROVIDER: Jean Michaelis  REFERRING DIAG: Please see note for PT instructions  Post right shoulder arthroscopy, RCR, BT on 02/02/2024  THERAPY DIAG:  Acute pain of right shoulder  Stiffness of right shoulder, not elsewhere classified  Muscle weakness (generalized)  Abnormal posture  Rationale for Evaluation and Treatment: Rehabilitation  ONSET DATE: Surgery 02/02/24   SUBJECTIVE:                                                                                                                                                                                      SUBJECTIVE STATEMENT: Pt states pain is pretty much nothing.   Hand dominance: Right  PERTINENT HISTORY: Right shoulder arthroscopy RTC repair suprapspinatus & subscapularis, biceps tenodesis 02/02/24, Colon CA 2005 with sg, Brachial neuritis, cervical radiculopathy, HTN  PAIN:  Yes: NPRS scale:  1/10 Pain location: right shoulder wrapping around whole shoulder  Pain description: random aches  Aggravating factors:  nothing  Relieving factors: ice or heat, stretching   PRECAUTIONS: Plan is continue with range of motion exercises.  Start physical therapy upstairs to focus on passive and active assisted range of motion of the right shoulder (okay to start active assisted range of motion on 03/01/24).  Okay to start full active range of motion and rotator cuff strengthening exercises on 03/15/2024.  Follow-up in 3 weeks for clinical recheck and determination of return to work potentially.  RED FLAGS: None   WEIGHT BEARING RESTRICTIONS: Yes NWB surgicial shoulder   FALLS:  Has patient fallen in last 6 months? 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  DIAGNOSTIC FINDINGS:   Narrative & Impression  CLINICAL DATA:  Acute right shoulder pain. Limited range of motion. Cracking and popping.   EXAM: MRI OF THE RIGHT SHOULDER WITH CONTRAST   TECHNIQUE: Multiplanar, multisequence MR imaging of the right shoulder was performed following the administration of intra-articular contrast.   CONTRAST:  See Injection Documentation.   COMPARISON:  right shoulder radiographs 01/08/2024   FINDINGS: Rotator cuff: There is abnormal extension of gadolinium contrast into the subacromial/subdeltoid bursa. This appears to be through a full-thickness tear of the anterior supraspinatus tendon footprint measuring up to approximately 9 mm in AP dimension (sagittal series 8, image 8 and with up to only 4 mm tendon retraction (coronal series 5 and 6 images 11 through 13). The infraspinatus is intact. There is high-grade partial thickness tearing of the subscapularis tendon diffusely, with likely a full-thickness component superiorly and only minimal anterior fibers inserting inferiorly (axial images 8 through 15). The teres minor is intact.   Muscles:  Mild to moderate supraspinatus muscle atrophy.   Biceps long head: The origin of the long head of the biceps tendon appears to be intact. The biceps tendon is markedly attenuated just proximal to the bicipital groove, and there appears to be an at least 90% partial-thickness and possible full-thickness tear of the tendon as it courses over the lesser tuberosity into the bicipital groove (axial images 11 through 13). A more normal caliber  biceps tendon is seen within the more distal aspect of the bicipital groove (axial images 17-20).   Acromioclavicular Joint: There are mild degenerative changes of the acromioclavicular joint including joint space narrowing, subchondral marrow edema, and peripheral osteophytosis. Type II acromion.   Glenohumeral Joint: Mild glenohumeral cartilage thinning.   Labrum: There is adequate distention of the glenohumeral joint following arthrogram injection. No glenoid labral tear is seen.   Bones:  No acute fracture.   Other: None.   IMPRESSION: 1. Full-thickness tear of the anterior supraspinatus tendon footprint measuring up to approximately 9 mm in AP dimension and with up to only 4 mm tendon retraction. Mild to moderate supraspinatus muscle atrophy. 2. High-grade partial thickness tearing of the subscapularis tendon diffusely, with likely a full-thickness component superiorly and only minimal intact anterior fibers inserting inferiorly. 3. The long head of the biceps tendon is markedly attenuated just proximal to the bicipital groove, and there appears to be an at least 90% partial-thickness and possible full-thickness tear of the tendon as it courses over the lesser tuberosity into the bicipital groove. 4. Mild degenerative changes of the acromioclavicular joint.    PATIENT SURVEYS:   Patient-Specific Activity Scoring Scheme  "0" represents "unable to perform." "10" represents "able to perform at prior level. 0 1 2 3 4 5 6 7 8 9  10 (Date and Score)   Activity Eval  04/21/24  1. Doing hair/make up  1  8  2. Cooking   3 10  3. Getting dressed  4 9  4. Pick up things  0 9  5. Brush teeth  4 10  6.  Write normally  2 9  Score 2.3 9.16   Total score = sum of the activity scores/number of activities Minimum detectable change (90%CI) for average score = 2 points Minimum detectable change (90%CI) for single  activity score = 3 points  COGNITION: Overall cognitive status:  Within functional limits for tasks assessed     SENSATION: WFL  POSTURE: Rounded shoulders, forward head   UPPER EXTREMITY ROM:   Passive ROM Right eval Right 03/07/24 Right 03/17/2024 Right 03/22/24 Right 04/12/24 Right 04/21/24  Shoulder flexion Initially 40*, able to get to 90* passive with repeated reps P: 98* 95* AAROM: 130 AROM: 130 AROM: 155  Shoulder extension      AROM: 50  Shoulder scaption  Initially about 30*, able to get to about 75* passive with repeated reps  P: 81*   AROM: 110  AROM: 140  Shoulder adduction        Shoulder internal rotation Hand to belly at 0* ABD   At 70 degrees abduction 45*   AROM: To T6  Shoulder external rotation Initially -30* at 0* ABD, able to get to about -20* passive with repeated reps  P: 38* at 45* abduction At 70 degrees abduction 35* 65 deg at 0 deg abd AROM: 80 deg at 0 deg abd AROM: 70 deg at 0 deg abd 40 deg at 90 deg abd  Elbow flexion        Elbow extension        Wrist flexion        Wrist extension        Wrist ulnar deviation        Wrist radial deviation        Wrist pronation        Wrist supination        (Blank rows = not tested)  UPPER EXTREMITY MMT:  MMT Right eval Left eval  Shoulder flexion    Shoulder extension    Shoulder abduction    Shoulder adduction    Shoulder internal rotation    Shoulder external rotation    Middle trapezius    Lower trapezius    Elbow flexion    Elbow extension    Wrist flexion    Wrist extension    Wrist ulnar deviation    Wrist radial deviation    Wrist pronation    Wrist supination    Grip strength (lbs)    (Blank rows = not tested)  Did not check MMT at eval due to post-op precautions    PALPATION:  Scar initially restricted but got it moving well with a bit of scar massage, encouraged this at home                                                                                                                              TREATMENT DATE:  04/21/24 TherEx UBE L3 x 6 in (3  min each direction) Supine shoulder ER at 90 deg abd 1# 2x10 Rechecked ROM and goals  TherAct Supine shoulder flexion 1# 2x10 Sidelying shoulder abd 1# 2x10 palm down Sidelying shoulder abd 1# x10 thumb up Overhead press with 1# WaTE bar 2x10, "Y" x10  Seated forward chest press 1# 2x10 Seated overhead tricep ext 1# 2x10 Bicep curl 2# 2x10 Hammer curl 2# 2x10   04/20/24 TherEx UBE L3 x 6 in (3 min each direction) Rows L2 band 2x15; 5 sec hold Shoulder extension L2 band 2x15 Shoulder ER/IR L1 band 2x15  TherAct Wall ladder flexion and scaption with 1# wrist weight x 10 each direction; 5 sec hold for motion Multidirectional wall wash 1# x 10 reps each direction Standing shoulder flexion 1# 2x10   04/12/24 UBE L1 x 3 min fwd, 3 min bwd Doorway pec stretch low, mid, high x30" each Standing ER with strap 2x10 Standing hands behind head elbows apart 2x10x3" Standing IR with strap 2x10 Supine shoulder flexion yellow TB 2x10 Supine shoulder abd yellow TB 2x10 Supine horizontal shoulder abd yellow TB 2x10 Supine D2 PNF shoulder yellow TB 2x10   04/08/24 UBE L1 x 3 min fwd, 3 min bwd  Bicep stretch 2x30" Doorway pec stretch low, mid, high x30" each Standing against wall "W" 2x10 Standing ER with strap 2x10 Standing hands behind head elbows apart 10x3" Standing hands behind neck, slide hands up/down head to work on doing her own hair x10 Standing IR with strap 2x10 Standing wall flexion slide x10 Standing horizontal abd/add x10 Standing scaption slide x10 Standing wall wash circles to fatigue  04/05/24 Pulleys flexion x1 min, scaption x 1 min, horizontal abd x1 min Bicep stretch 2x30" Doorway pec stretch low and mid x30" each Standing row green TB 2x10 Standing shoulder ext green TB 2x10 Standing shoulder ER red TB 2x8 Standing shoulder IR red TB 2x8 Standing wall flexion slide x10 Standing wall scaption slide x10 Standing wall wash horizontal abd/add x10 STM & TPR pec  and bicep Manual shoulder stretch to end range scaption and ER/IR    HOME EXERCISE PROGRAM: Access Code: [email or web address] URL: [email or web address] Date: 04/08/2024 Prepared by: Disha Cottam April Erman Hayward  Exercises - Doorway Pec Stretch at 60 Degrees Abduction with Arm Straight  - 1 x daily - 7 x weekly - 2 sets - 30 sec hold - Doorway Pec Stretch at 60 Elevation  - 1 x daily - 7 x weekly - 2 sets - 30 sec hold - Standing Scapular Retraction  - 5 x daily - 7 x weekly - 1 sets - 5 reps - 5 second hold - Shoulder External Rotation in 45 Degrees Abduction  - 1 x daily - 7 x weekly - 2 sets - 10 reps - Standing Overhead Shoulder External Rotation Stretch with Towel  - 1 x daily - 7 x weekly - 2 sets - 10 reps - Pec Minor Stretch  - 1 x daily - 7 x weekly - 2 sets - 10 reps - Shoulder Flexion Wall Slide with Towel  - 1 x daily - 7 x weekly - 3 sets - 10 reps - Scaption Wall Slide with Towel  - 1 x daily - 7 x weekly - 3 sets - 10 reps - Standing Shoulder Abduction Slides at Wall  - 1 x daily - 7 x weekly - 3 sets - 10 reps   ASSESSMENT:  CLINICAL IMPRESSION: Continued strengthening for overhead lifting and reaching tasks. Pt with increased ROM and strength noted; however, will continue to benefit from PT to maximize her level of function. Pt has met 4/5 LTGs and 3/4 STGs. Greatest deficit is tightness with shoulder ER.   OBJECTIVE IMPAIRMENTS: decreased ROM, decreased strength, increased edema, increased fascial restrictions, increased muscle spasms, impaired flexibility,  impaired UE functional use, improper body mechanics, postural dysfunction, and pain.   ACTIVITY LIMITATIONS: carrying, lifting, bathing, toileting, dressing, self feeding, reach over head, hygiene/grooming, and caring for others  PARTICIPATION LIMITATIONS: meal prep, cleaning, laundry, driving, shopping, community activity, occupation, and yard work  PERSONAL FACTORS: Time since onset of  injury/illness/exacerbation are also affecting patient's functional outcome.   REHAB POTENTIAL: Excellent  CLINICAL DECISION MAKING: Stable/uncomplicated  EVALUATION COMPLEXITY: Low   GOALS: Goals reviewed with patient? No  SHORT TERM GOALS: Target date:  03/25/2024     Patient will be compliant with appropriate progressive HEP        GOAL STATUS: Met 03/17/2024  2. R shoulder flexion and ABD A/PROM to be at least 150*         GOAL STATUS: Ongoing  04/21/2024    3. R shoulder IR and ER A/PROM to be at least 45* at 90* ABD           GOAL STATUS: MET  04/21/2024   4. Will be more aware of posture with all functional tasks with use of ergonomic aides PRN/as desired      GOAL STATUS: MET 04/21/2024     LONG TERM GOALS: Target date: 06/02/2024    MMT in surgical shoulder to be at least 4/5 all tested groups  Baseline:  Goal status: Ongoing  04/21/2024 -- only able to lift 1#   2.  Pain to be no more than 2/10 with all functional task performance  Baseline:  Goal status: MET 04/21/2024 -- no more than a 2  3.  Will be able to perform all personal care and hygiene without increase in pain or difficulty with surgical shoulder  Baseline:  Goal status: MET 04/21/2024 -- able to do her hair and dressing/bathing except donning/doffing bra  4.  Will be able to perform all home and work based tasks and requirements without increase in pain surgical shoulder  Baseline:  Goal status: MET 04/21/2024   5.  PSFS to have improved by at least 4 points  Baseline:  Goal status:  MET  04/21/2024    PLAN:  PT FREQUENCY: 1-2x/wk   PT DURATION: 6 weeks  PLANNED INTERVENTIONS: 97750- Physical Performance Testing, 97110-Therapeutic exercises, 97530- Therapeutic activity, [id number]- Neuromuscular re-education, 97535- Self Care, [id number]- Manual therapy, [id number]- Aquatic Therapy, [id number]- Electrical stimulation (unattended), 97016- Vasopneumatic device, [id number]- Ultrasound, 29528- Ionotophoresis 4mg /ml Dexamethasone ,  Patient/Family education, Taping, Dry Needling, Cryotherapy, and Moist heat  PLAN FOR NEXT SESSION: Update HEP. AROM and capsular flexibility emphasis. Check/update ROM. May need to progress resistance slowly pending pain response   Per MD note 03/16/24: Her strength is in a good spot for where she is at, considering she has not started any strengthening exercises yet. She should be okay to start rotator cuff strengthening exercises now. Continue with active motion.     Noland Pizano April Ma L Dameon Soltis, PT, DPT 04/21/24 3:16 PM

## 2024-04-26 ENCOUNTER — Ambulatory Visit: Admitting: Physical Therapy

## 2024-04-26 ENCOUNTER — Encounter: Payer: Self-pay | Admitting: Physical Therapy

## 2024-04-26 DIAGNOSIS — M6281 Muscle weakness (generalized): Secondary | ICD-10-CM

## 2024-04-26 DIAGNOSIS — M25611 Stiffness of right shoulder, not elsewhere classified: Secondary | ICD-10-CM | POA: Diagnosis not present

## 2024-04-26 DIAGNOSIS — R293 Abnormal posture: Secondary | ICD-10-CM | POA: Diagnosis not present

## 2024-04-26 DIAGNOSIS — M25511 Pain in right shoulder: Secondary | ICD-10-CM

## 2024-04-26 NOTE — Therapy (Signed)
 OUTPATIENT PHYSICAL THERAPY SHOULDER TREATMENT   Patient Name: Courtney Watson MRN: [id number] DOB:[birth year], 63 y.o., female Today's Date: 04/26/2024  END OF SESSION:  PT End of Session - 04/26/24 1308     Visit Number 16    Number of Visits 27    Date for PT Re-Evaluation 06/02/24    Authorization Type [employer]    Authorization Time Period 02/26/24 to 04/22/24    PT Start Time 1302    PT Stop Time 1340    PT Time Calculation (min) 38 min    Activity Tolerance Patient tolerated treatment well    Behavior During Therapy [facility] for tasks assessed/performed              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   Malignant tumor of colon (HCC) 04/01/2018   Neck pain 09/12/2014   Osteopenia 2018   hips and spine   Palpitations 04/01/2018   Perimenopausal disorder 04/01/2018   Seasonal allergies    Shortness of breath on exertion 06/08/2017   Ulcerative colitis (HCC)    Uterine leiomyoma 04/01/2018   Vitamin D  deficiency    Past Surgical History:  Procedure Laterality Date   BICEPT TENODESIS Right 02/02/2024   Procedure: TENODESIS, BICEPS;  Surgeon: Jasmine Mesi, MD;  Location: [facility] OR;  Service: Orthopedics;  Laterality: Right;   COLON SURGERY  2005   colon cancer--Dr.Rosenbower   ENDOMETRIAL ABLATION  2012   Dr.Lomax   KNEE ARTHROSCOPY Right    MYOMECTOMY  2012   Dr.Lomax   POSTERIOR LUMBAR FUSION 2 WITH  HARDWARE REMOVAL Right 02/02/2024   Procedure: ARTHROSCOPY, SHOULDER WITH DEBRIDEMENT;  Surgeon: Jasmine Mesi, MD;  Location: [facility] OR;  Service: Orthopedics;  Laterality: Right;  PROCEDURE:  RIGHT SHOULDER ARTHROSCOPY, DEBRIDEMENT, MINI OPEN BICEPS TENODESIS & ROTATOR CUFF TEAR REPAIR -SUPRASPINATUS AND SUBSCAPULARIS   SHOULDER OPEN ROTATOR CUFF REPAIR Right 02/02/2024   Procedure: REPAIR, ROTATOR CUFF, OPEN;  Surgeon: Jasmine Mesi, MD;  Location: [facility] OR;  Service: Orthopedics;  Laterality: Right;   Patient Active Problem List   Diagnosis Date Noted   Nontraumatic tear of right rotator cuff 02/07/2024   Degenerative superior labral anterior-to-posterior (SLAP) tear of right shoulder 02/07/2024   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   Synovitis of right shoulder 02/07/2024   Biceps tendonitis on right 02/07/2024   Pain in right shoulder 01/08/2024   Inflamed seborrheic keratosis 11/17/2023   Nevus of right shoulder 11/17/2023   Onychoschizia 11/17/2023   Sun-damaged skin 11/17/2023   Encounter for annual physical exam 11/07/2022   Arthritis pain of hand 11/07/2022   High cholesterol 02/19/2022   Essential hypertension 11/21/2021   Seasonal allergies    History of chicken pox    H/O rhinoplasty    Fibroid    Eating  disorder    Colon polyps    Blood in stool    Abnormal uterine bleeding    B12 deficiency 04/09/2020   Left thyroid  nodule    Upper airway cough syndrome 04/07/2019   Malignant tumor of colon (HCC) 04/01/2018   Perimenopausal disorder 04/01/2018   Uterine leiomyoma 04/01/2018   Palpitations 04/01/2018   Irregular heart beat 04/01/2018   Ulcerative colitis (HCC) 01/25/2018   Vitamin D  deficiency 01/25/2018   Fatigue 01/25/2018   Shortness of breath on exertion 06/08/2017   Knee pain, right 08/22/2015   Cervical radiculopathy 01/10/2015   Anxiety 09/12/2014   Osteopenia 09/12/2014   Neck pain 09/12/2014   Malignant neoplasm of large intestine (HCC)  09/12/2014   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   Cancer (HCC) 2005    PCP: Allwardt, Alyssa PA-C   REFERRING PROVIDER: Jean Michaelis  REFERRING DIAG: Please see note for PT instructions  Post right shoulder arthroscopy, RCR, BT on 02/02/2024  THERAPY DIAG:  Acute pain of right shoulder  Stiffness of right shoulder, not elsewhere classified  Muscle weakness (generalized)  Abnormal posture  Rationale for Evaluation and Treatment: Rehabilitation  ONSET DATE: Surgery 02/02/24   SUBJECTIVE:                                                                                                                                                                                      SUBJECTIVE STATEMENT: "I think I overdid it a little this weekend." Pt states pain is pretty much nothing.   Hand dominance: Right  PERTINENT HISTORY: Right shoulder arthroscopy RTC repair suprapspinatus & subscapularis, biceps tenodesis 02/02/24, Colon CA 2005 with sg, Brachial neuritis, cervical radiculopathy, HTN  PAIN:  Yes: NPRS scale:  0/10 Pain location: right shoulder wrapping around whole shoulder  Pain description: random aches  Aggravating factors:  nothing  Relieving factors: ice or heat, stretching   PRECAUTIONS: Plan is continue with range of motion exercises.  Start physical therapy upstairs to focus on passive and active assisted range of motion of the right shoulder (okay to start active assisted range of motion on 03/01/24).  Okay to start full active range of motion and rotator cuff strengthening exercises on 03/15/2024.  Follow-up in 3 weeks for clinical recheck and determination of return to work potentially.  RED FLAGS: None   WEIGHT BEARING RESTRICTIONS: Yes NWB surgicial shoulder   FALLS:  Has patient fallen in last 6 months? 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  DIAGNOSTIC FINDINGS:   MRI   IMPRESSION: 1. Full-thickness tear of the anterior supraspinatus tendon footprint measuring up to approximately 9 mm in AP dimension and with up to only 4 mm tendon retraction. Mild to moderate supraspinatus muscle atrophy. 2. High-grade partial thickness tearing of the subscapularis tendon diffusely, with likely a full-thickness component superiorly and only minimal intact anterior fibers inserting inferiorly. 3. The long head of the biceps tendon is markedly attenuated just proximal to the bicipital groove, and there appears to be an at least 90% partial-thickness and possible full-thickness tear of the tendon as it courses over the lesser tuberosity into the bicipital groove. 4. Mild degenerative changes of the acromioclavicular joint.    PATIENT SURVEYS:   Patient-Specific Activity Scoring Scheme  "0" represents "unable to perform." "10" represents "able to perform at prior level. 0 1 2 3 4 5 6 7 8 9  10 (Date and Score)   Activity Eval  04/21/24  1. Doing hair/make up  1  8  2. Cooking   3 10  3. Getting dressed  4 9  4. Pick up things  0 9  5. Brush teeth  4 10  6.  Write normally  2 9  Score 2.3 9.16   Total score = sum of the activity scores/number of activities Minimum detectable change (90%CI) for average score = 2 points Minimum detectable change (90%CI) for single activity score = 3 points  COGNITION: Overall cognitive status: Within functional limits for tasks assessed     SENSATION: WFL  POSTURE: Rounded shoulders, forward head    UPPER EXTREMITY ROM:   Passive ROM Right eval Right 03/07/24 Right 03/17/2024 Right 03/22/24 Right 04/12/24 Right 04/21/24  Shoulder flexion Initially 40*, able to get to 90* passive with repeated reps P: 98* 95* AAROM: 130 AROM: 130 AROM: 155  Shoulder extension      AROM: 50  Shoulder scaption  Initially about 30*, able to get to about 75* passive with repeated reps  P: 81*   AROM: 110  AROM: 140  Shoulder adduction        Shoulder internal rotation Hand to belly at 0* ABD   At 70 degrees abduction 45*   AROM: To T6  Shoulder external rotation Initially -30* at 0* ABD, able to get to about -20* passive with repeated reps  P: 38* at 45* abduction At 70 degrees abduction 35* 65 deg at 0 deg abd AROM: 80 deg at 0 deg abd AROM: 70 deg at 0 deg abd 40 deg at 90 deg abd  (Blank rows = not tested)  UPPER EXTREMITY MMT:  MMT Right eval Left eval  Shoulder flexion    Shoulder extension    Shoulder abduction    Shoulder adduction    Shoulder internal rotation    Shoulder external rotation    Middle trapezius    Lower trapezius    Elbow flexion    Elbow extension    Wrist flexion    Wrist extension    Wrist ulnar deviation    Wrist radial deviation    Wrist pronation    Wrist supination    Grip strength (lbs)    (Blank rows = not  tested)  Did not check MMT at eval due to post-op precautions    PALPATION:  Scar initially restricted but got it moving well with a bit of scar massage, encouraged this at home                                                                                                                              TREATMENT DATE:  04/26/24 TherAct Rows L4 band 3x10 Shoulder extension L4 band 3x10 Bicep curl into overhead press 3x10 on Rt, 2# within available range Bent over tricep press on Rt with 3#; 3x10 Wall push up 3x10 Multidirectional wall wash 1.5# x 10 reps each direction  TherEx UBE L3 x 6 in (3 min each direction) Standing shoulder flexion on Rt to  90 deg 2# 3x10 Standing shoulder abduction on Rt to 90 deg 1# 3x10  04/21/24 TherEx UBE L3 x 6 in (3 min each direction) Supine shoulder ER at 90 deg abd 1# 2x10 Rechecked ROM and goals  TherAct Supine shoulder flexion 1# 2x10 Sidelying shoulder abd 1# 2x10 palm down Sidelying shoulder abd 1# x10 thumb up Overhead press with 1# WaTE bar 2x10, "Y" x10 Seated forward chest press 1# 2x10 Seated overhead tricep ext 1# 2x10 Bicep curl 2# 2x10 Hammer curl 2# 2x10   04/20/24 TherEx UBE L3 x 6 in (3 min each direction) Rows L2 band 2x15; 5 sec hold Shoulder extension L2 band 2x15 Shoulder ER/IR L1 band 2x15  TherAct Wall ladder flexion and scaption with 1# wrist weight x 10 each direction; 5 sec hold for motion Multidirectional wall wash 1# x 10 reps each direction Standing shoulder flexion 1# 2x10   04/12/24 UBE L1 x 3 min fwd, 3 min bwd Doorway pec stretch low, mid, high x30" each Standing ER with strap 2x10 Standing hands behind head elbows apart 2x10x3" Standing IR with strap 2x10 Supine shoulder flexion yellow TB 2x10 Supine shoulder abd yellow TB 2x10 Supine horizontal shoulder abd yellow TB 2x10 Supine D2 PNF shoulder yellow TB 2x10    HOME EXERCISE PROGRAM: Access Code: [email or web address] URL: [email or web address] Date: 04/08/2024 Prepared by: Gellen April Marie Nonato  Exercises - Doorway Pec Stretch at 60 Degrees Abduction with Arm Straight  - 1 x daily - 7 x weekly - 2 sets - 30 sec hold - Doorway Pec Stretch at 60 Elevation  - 1 x daily - 7 x weekly - 2 sets - 30 sec hold - Standing Scapular Retraction  - 5 x daily - 7 x weekly - 1 sets - 5 reps - 5 second hold - Shoulder External Rotation in 45 Degrees Abduction  - 1 x daily - 7 x weekly - 2 sets - 10 reps - Standing Overhead Shoulder External Rotation Stretch with Towel  - 1 x daily - 7 x weekly - 2 sets - 10 reps - Pec Minor Stretch  - 1 x daily - 7 x weekly - 2 sets -  10 reps - Shoulder Flexion Wall  Slide with Towel  - 1 x daily - 7 x weekly - 3 sets - 10 reps - Scaption Wall Slide with Towel  - 1 x daily - 7 x weekly - 3 sets - 10 reps - Standing Shoulder Abduction Slides at Wall  - 1 x daily - 7 x weekly - 3 sets - 10 reps   ASSESSMENT:  CLINICAL IMPRESSION: Pt with increased Rt shoulder tightness today due to increased use of RUE over the weekend.  Still having decreased strength in Rt shoulder and will continue to benefit from PT to maximize strength.   OBJECTIVE IMPAIRMENTS: decreased ROM, decreased strength, increased edema, increased fascial restrictions, increased muscle spasms, impaired flexibility, impaired UE functional use, improper body mechanics, postural dysfunction, and pain.   ACTIVITY LIMITATIONS: carrying, lifting, bathing, toileting, dressing, self feeding, reach over head, hygiene/grooming, and caring for others  PARTICIPATION LIMITATIONS: meal prep, cleaning, laundry, driving, shopping, community activity, occupation, and yard work  PERSONAL FACTORS: Time since onset of injury/illness/exacerbation are also affecting patient's functional outcome.   REHAB POTENTIAL: Excellent  CLINICAL DECISION MAKING: Stable/uncomplicated  EVALUATION COMPLEXITY: Low   GOALS: Goals reviewed with patient? No  SHORT TERM GOALS: Target date:  03/25/2024     Patient will be compliant with appropriate progressive HEP        GOAL STATUS: Met 03/17/2024  2. R shoulder flexion and ABD A/PROM to be at least 150*         GOAL STATUS: Ongoing  04/21/2024    3. R shoulder IR and ER A/PROM to be at least 45* at 90* ABD           GOAL STATUS: MET  04/21/2024   4. Will be more aware of posture with all functional tasks with use of ergonomic aides PRN/as desired      GOAL STATUS: MET 04/21/2024     LONG TERM GOALS: Target date: 06/02/2024    MMT in surgical shoulder to be at least 4/5 all tested groups  Baseline:  Goal status: Ongoing  04/21/2024 -- only able to lift 1#   2.  Pain  to be no more than 2/10 with all functional task performance  Baseline:  Goal status: MET 04/21/2024 -- no more than a 2  3.  Will be able to perform all personal care and hygiene without increase in pain or difficulty with surgical shoulder  Baseline:  Goal status: MET 04/21/2024 -- able to do her hair and dressing/bathing except donning/doffing bra  4.  Will be able to perform all home and work based tasks and requirements without increase in pain surgical shoulder  Baseline:  Goal status: MET 04/21/2024   5.  PSFS to have improved by at least 4 points  Baseline:  Goal status:  MET  04/21/2024    PLAN:  PT FREQUENCY: 1-2x/wk   PT DURATION: 6 weeks  PLANNED INTERVENTIONS: 97750- Physical Performance Testing, 97110-Therapeutic exercises, 97530- Therapeutic activity, 97112- Neuromuscular re-education, 97535- Self Care, 56213- Manual therapy, [phone]- Aquatic Therapy, [id number]- Electrical stimulation (unattended), [id number]- Vasopneumatic device, [id number]- Ultrasound, 29528- Ionotophoresis 4mg /ml Dexamethasone , Patient/Family education, Taping, Dry Needling, Cryotherapy, and Moist heat  PLAN FOR NEXT SESSION: continue strengthening focus  Per MD note 03/16/24: Her strength is in a good spot for where she is at, considering she has not started any strengthening exercises yet. She should be okay to start rotator cuff strengthening exercises now. Continue with active motion.     Trevor Fudge  Karel Osler, PT, DPT 04/26/24 1:47 PM

## 2024-04-27 ENCOUNTER — Encounter: Admitting: Surgical

## 2024-04-28 ENCOUNTER — Encounter: Payer: Self-pay | Admitting: Physical Therapy

## 2024-04-28 ENCOUNTER — Ambulatory Visit: Admitting: Physical Therapy

## 2024-04-28 DIAGNOSIS — M25611 Stiffness of right shoulder, not elsewhere classified: Secondary | ICD-10-CM

## 2024-04-28 DIAGNOSIS — R293 Abnormal posture: Secondary | ICD-10-CM | POA: Diagnosis not present

## 2024-04-28 DIAGNOSIS — M25511 Pain in right shoulder: Secondary | ICD-10-CM | POA: Diagnosis not present

## 2024-04-28 DIAGNOSIS — M6281 Muscle weakness (generalized): Secondary | ICD-10-CM

## 2024-04-28 NOTE — Therapy (Signed)
 OUTPATIENT PHYSICAL THERAPY SHOULDER TREATMENT   Patient Name: Courtney Watson MRN: [id number] DOB:[birth year], 63 y.o., female Today's Date: 04/28/2024  END OF SESSION:  PT End of Session - 04/28/24 1140     Visit Number 17    Number of Visits 27    Date for PT Re-Evaluation 06/02/24    Authorization Type U[employer]    Authorization Time Period 02/26/24 to 04/22/24    PT Start Time 1141    PT Stop Time 1228    PT Time Calculation (min) 47 min    Activity Tolerance Patient tolerated treatment well    Behavior During Therapy [facility] for tasks assessed/performed            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   Malignant tumor of colon (HCC) 04/01/2018   Neck pain 09/12/2014   Osteopenia 2018   hips and spine   Palpitations 04/01/2018   Perimenopausal disorder 04/01/2018   Seasonal allergies    Shortness of breath on exertion 06/08/2017   Ulcerative colitis (HCC)    Uterine leiomyoma 04/01/2018   Vitamin D  deficiency    Past Surgical History:  Procedure Laterality Date   BICEPT TENODESIS Right 02/02/2024   Procedure: TENODESIS, BICEPS;  Surgeon: Jasmine Mesi, MD;  Location: [facility] OR;  Service: Orthopedics;  Laterality: Right;   COLON SURGERY  2005   colon cancer--Dr.Rosenbower   ENDOMETRIAL ABLATION  2012   Dr.Lomax   KNEE ARTHROSCOPY Right    MYOMECTOMY  2012   Dr.Lomax   POSTERIOR LUMBAR FUSION 2 WITH  HARDWARE REMOVAL Right 02/02/2024   Procedure: ARTHROSCOPY, SHOULDER WITH DEBRIDEMENT;  Surgeon: Jasmine Mesi, MD;  Location: [facility] OR;  Service: Orthopedics;  Laterality: Right;  PROCEDURE:  RIGHT SHOULDER ARTHROSCOPY, DEBRIDEMENT, MINI OPEN BICEPS TENODESIS & ROTATOR CUFF TEAR REPAIR -SUPRASPINATUS AND SUBSCAPULARIS   SHOULDER OPEN ROTATOR CUFF REPAIR Right 02/02/2024   Procedure: REPAIR, ROTATOR CUFF, OPEN;  Surgeon: Jasmine Mesi, MD;  Location: [facility] OR;  Service: Orthopedics;  Laterality: Right;   Patient Active Problem List   Diagnosis Date Noted   Nontraumatic tear of right rotator cuff 02/07/2024   Degenerative superior labral anterior-to-posterior (SLAP) tear of right shoulder 02/07/2024   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   Synovitis of right shoulder 02/07/2024   Biceps tendonitis on right 02/07/2024   Pain in right shoulder 01/08/2024   Inflamed seborrheic keratosis 11/17/2023   Nevus of right shoulder 11/17/2023   Onychoschizia 11/17/2023   Sun-damaged skin 11/17/2023   Encounter for annual physical exam 11/07/2022   Arthritis pain of hand 11/07/2022   High cholesterol 02/19/2022   Essential hypertension 11/21/2021   Seasonal allergies    History of chicken pox    H/O rhinoplasty    Fibroid    Eating disorder  Colon polyps    Blood in stool    Abnormal uterine bleeding    B12 deficiency 04/09/2020   Left thyroid  nodule    Upper airway cough syndrome 04/07/2019   Malignant tumor of colon (HCC) 04/01/2018   Perimenopausal disorder 04/01/2018   Uterine leiomyoma 04/01/2018   Palpitations 04/01/2018   Irregular heart beat 04/01/2018   Ulcerative colitis (HCC) 01/25/2018   Vitamin D  deficiency 01/25/2018   Fatigue 01/25/2018   Shortness of breath on exertion 06/08/2017   Knee pain, right 08/22/2015   Cervical radiculopathy 01/10/2015   Anxiety 09/12/2014   Osteopenia 09/12/2014   Neck pain 09/12/2014   Malignant neoplasm of large intestine (HCC)  09/12/2014   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   Cancer (HCC) 2005    PCP: Allwardt, Alyssa PA-C   REFERRING PROVIDER: Jean Michaelis  REFERRING DIAG: Please see note for PT instructions  Post right shoulder arthroscopy, RCR, BT on 02/02/2024  THERAPY DIAG:  Acute pain of right shoulder  Stiffness of right shoulder, not elsewhere classified  Muscle weakness (generalized)  Abnormal posture  Rationale for Evaluation and Treatment: Rehabilitation  ONSET DATE: Surgery 02/02/24   SUBJECTIVE:                                                                                                                                                                                      SUBJECTIVE STATEMENT: Still tight and sore today but doing okay  Hand dominance: Right  PERTINENT HISTORY: Right shoulder arthroscopy RTC repair suprapspinatus & subscapularis, biceps tenodesis 02/02/24, Colon CA 2005 with sg, Brachial neuritis, cervical radiculopathy, HTN  PAIN:  Yes: NPRS scale:  0/10 Pain location: right shoulder wrapping around whole shoulder  Pain description: random aches  Aggravating factors:  nothing  Relieving factors: ice or heat, stretching   PRECAUTIONS: Plan is continue with range of motion exercises.  Start physical therapy upstairs to focus on passive and active assisted range of motion of the right shoulder (okay to start active assisted range of motion on 03/01/24).  Okay to start full active range of motion and rotator cuff strengthening exercises on 03/15/2024.  Follow-up in 3 weeks for clinical recheck and determination of return to work potentially.  RED FLAGS: None   WEIGHT BEARING RESTRICTIONS: Yes NWB surgicial shoulder   FALLS:  Has patient fallen in last 6 months? 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  DIAGNOSTIC FINDINGS:   MRI   IMPRESSION: 1. Full-thickness tear of the anterior supraspinatus tendon footprint measuring up to approximately 9 mm in AP dimension and with up to only 4 mm tendon retraction. Mild to moderate supraspinatus muscle atrophy. 2. High-grade partial thickness tearing of the subscapularis tendon diffusely, with likely a full-thickness component superiorly and only minimal intact anterior fibers inserting inferiorly. 3. The long head of the biceps tendon is markedly attenuated just proximal to the bicipital groove, and there appears to be an at least 90% partial-thickness and possible full-thickness tear of the tendon as it courses over the lesser tuberosity into the bicipital groove. 4. Mild degenerative changes of the acromioclavicular joint.    PATIENT SURVEYS:   Patient-Specific Activity Scoring Scheme  0 represents "unable to perform." 10 represents "able to perform at prior level. 0 1 2 3 4 5 6 7 8 9  10 (Date and Score)   Activity Eval  04/21/24  1. Doing hair/make up  1  8  2. Cooking   3 10  3. Getting dressed  4 9  4. Pick up things  0 9  5. Brush teeth  4 10  6.  Write normally  2 9  Score 2.3 9.16   Total score = sum of the activity scores/number of activities Minimum detectable change (90%CI) for average score = 2 points Minimum detectable change (90%CI) for single activity score = 3 points  COGNITION: Overall cognitive status: Within functional limits for tasks assessed     SENSATION: WFL  POSTURE: Rounded shoulders, forward head   UPPER EXTREMITY ROM:   Passive ROM Right eval  Right 03/07/24 Right 03/17/2024 Right 03/22/24 Right 04/12/24 Right 04/21/24  Shoulder flexion Initially 40*, able to get to 90* passive with repeated reps P: 98* 95* AAROM: 130 AROM: 130 AROM: 155  Shoulder extension      AROM: 50  Shoulder scaption  Initially about 30*, able to get to about 75* passive with repeated reps  P: 81*   AROM: 110  AROM: 140  Shoulder adduction        Shoulder internal rotation Hand to belly at 0* ABD   At 70 degrees abduction 45*   AROM: To T6  Shoulder external rotation Initially -30* at 0* ABD, able to get to about -20* passive with repeated reps  P: 38* at 45* abduction At 70 degrees abduction 35* 65 deg at 0 deg abd AROM: 80 deg at 0 deg abd AROM: 70 deg at 0 deg abd 40 deg at 90 deg abd  (Blank rows = not tested)  UPPER EXTREMITY MMT:  MMT Right eval Left eval  Shoulder flexion    Shoulder extension    Shoulder abduction    Shoulder adduction    Shoulder internal rotation    Shoulder external rotation    Middle trapezius    Lower trapezius    Elbow flexion    Elbow extension    Wrist flexion    Wrist extension    Wrist ulnar deviation    Wrist radial deviation    Wrist pronation    Wrist supination    Grip strength (lbs)    (Blank rows = not tested)  Did not check MMT at eval due to post-op precautions    PALPATION:  Scar initially restricted but got it moving well with a bit of scar massage, encouraged this at home                                                                                                                              TREATMENT DATE:  04/28/24 TherEx UBE L4 x 6 in (3 min each direction) Sidelying Rt ER with 2# 3x10  TherAct Supine AA flexion with 5 sec hold at end range and 3# bar 3x10 for strength and ROM Supine Rt shoulder flexion full range with 5 sec hold 3x10; 2# Sidelying Rt shoulder abduction with 5 sec hold 3x10 Wall ladder flexion and scaption x 10 reps with 3 sec hold    04/26/24 TherAct Rows L4 band  3x10 Shoulder extension L4 band 3x10 Bicep curl into overhead press 3x10 on Rt, 2# within available range Bent over tricep press on Rt with 3#; 3x10 Wall push up 3x10 Multidirectional wall wash 1.5# x 10 reps each direction  TherEx UBE L3 x 6 in (3 min each direction) Standing shoulder flexion on Rt to 90 deg 2# 3x10 Standing shoulder abduction on Rt to 90 deg 1# 3x10  04/21/24 TherEx UBE L3 x 6 in (3 min each direction) Supine shoulder ER at 90 deg abd 1# 2x10 Rechecked ROM and goals  TherAct Supine shoulder flexion 1# 2x10 Sidelying shoulder abd 1# 2x10 palm down Sidelying shoulder abd 1# x10 thumb up Overhead press with 1# WaTE bar 2x10, Y x10 Seated forward chest press 1# 2x10 Seated overhead tricep ext 1# 2x10 Bicep curl 2# 2x10 Hammer curl 2# 2x10   04/20/24 TherEx UBE L3 x 6 in (3 min each direction) Rows L2 band 2x15; 5 sec hold Shoulder extension L2 band 2x15 Shoulder ER/IR L1 band 2x15  TherAct Wall ladder flexion and scaption with 1# wrist weight x 10 each direction; 5 sec hold for motion Multidirectional wall wash 1# x 10 reps each direction Standing shoulder flexion 1# 2x10   04/12/24 UBE L1 x 3 min fwd, 3 min bwd Doorway pec stretch low, mid, high x30 each Standing ER with strap 2x10 Standing hands behind head elbows apart 2x10x3 Standing IR with strap 2x10 Supine shoulder flexion yellow TB 2x10 Supine shoulder abd yellow TB 2x10 Supine horizontal shoulder abd yellow TB 2x10 Supine D2 PNF shoulder yellow TB 2x10    HOME EXERCISE PROGRAM: Access Code: [email or web address] URL: [email or web address] Date: 04/08/2024 Prepared by: Gellen April Marie Nonato  Exercises - Doorway Pec Stretch at 60 Degrees Abduction with Arm Straight  - 1 x daily - 7 x weekly - 2 sets - 30 sec hold - Doorway Pec Stretch at 60 Elevation  - 1 x daily - 7 x weekly - 2 sets - 30 sec hold - Standing Scapular Retraction  - 5 x daily - 7 x weekly - 1 sets - 5 reps - 5  second hold - Shoulder External Rotation in 45 Degrees  Abduction  - 1 x daily - 7 x weekly - 2 sets - 10 reps - Standing Overhead Shoulder External Rotation Stretch with Towel  - 1 x daily - 7 x weekly - 2 sets - 10 reps - Pec Minor Stretch  - 1 x daily - 7 x weekly - 2 sets - 10 reps - Shoulder Flexion Wall Slide with Towel  - 1 x daily - 7 x weekly - 3 sets - 10 reps - Scaption Wall Slide with Towel  - 1 x daily - 7 x weekly - 3 sets - 10 reps - Standing Shoulder Abduction Slides at Wall  - 1 x daily - 7 x weekly - 3 sets - 10 reps   ASSESSMENT:  CLINICAL IMPRESSION: Increased soreness today so did not progress strengthening and focused on combination of strengthening and stretching exercises today.  Pt reporting symptoms improved following session with decreased tightness.  Continue skilled PT.  OBJECTIVE IMPAIRMENTS: decreased ROM, decreased strength, increased edema, increased fascial restrictions, increased muscle spasms, impaired flexibility, impaired UE functional use, improper body mechanics, postural dysfunction, and pain.   ACTIVITY LIMITATIONS: carrying, lifting, bathing, toileting, dressing, self feeding, reach over head, hygiene/grooming, and caring for others  PARTICIPATION LIMITATIONS: meal prep, cleaning, laundry, driving, shopping, community activity, occupation, and yard work  PERSONAL FACTORS: Time since onset of injury/illness/exacerbation are also affecting patient's functional outcome.   REHAB POTENTIAL: Excellent  CLINICAL DECISION MAKING: Stable/uncomplicated  EVALUATION COMPLEXITY: Low   GOALS: Goals reviewed with patient? No  SHORT TERM GOALS: Target date:  03/25/2024     Patient will be compliant with appropriate progressive HEP        GOAL STATUS: Met 03/17/2024  2. R shoulder flexion and ABD A/PROM to be at least 150*         GOAL STATUS: Ongoing  04/21/2024    3. R shoulder IR and ER A/PROM to be at least 45* at 90* ABD           GOAL STATUS: MET   04/21/2024   4. Will be more aware of posture with all functional tasks with use of ergonomic aides PRN/as desired      GOAL STATUS: MET 04/21/2024     LONG TERM GOALS: Target date: 06/02/2024    MMT in surgical shoulder to be at least 4/5 all tested groups  Baseline:  Goal status: Ongoing  04/21/2024 -- only able to lift 1#   2.  Pain to be no more than 2/10 with all functional task performance  Baseline:  Goal status: MET 04/21/2024 -- no more than a 2  3.  Will be able to perform all personal care and hygiene without increase in pain or difficulty with surgical shoulder  Baseline:  Goal status: MET 04/21/2024 -- able to do her hair and dressing/bathing except donning/doffing bra  4.  Will be able to perform all home and work based tasks and requirements without increase in pain surgical shoulder  Baseline:  Goal status: MET 04/21/2024   5.  PSFS to have improved by at least 4 points  Baseline:  Goal status:  MET  04/21/2024    PLAN:  PT FREQUENCY: 1-2x/wk   PT DURATION: 6 weeks  PLANNED INTERVENTIONS: 97750- Physical Performance Testing, 97110-Therapeutic exercises, 97530- Therapeutic activity, 97112- Neuromuscular re-education, 97535- Self Care, 56213- Manual therapy, [id number]- Aquatic Therapy, [id number]- Electrical stimulation (unattended), 97016- Vasopneumatic device, [id number]- Ultrasound, [id number]- Ionotophoresis 4mg /ml Dexamethasone , Patient/Family education, Taping, Dry Needling, Cryotherapy, and Moist heat  PLAN FOR NEXT SESSION: what did MD say?,  continue strengthening focus  Per MD note 03/16/24: Her strength is in a good spot for where she is at, considering she has not started any strengthening exercises yet. She should be okay to start rotator cuff strengthening exercises now. Continue with active motion.   NEXT MD VISIT: 04/29/24  Marley Simmers, PT, DPT 04/28/24 12:52 PM

## 2024-04-29 ENCOUNTER — Ambulatory Visit (INDEPENDENT_AMBULATORY_CARE_PROVIDER_SITE_OTHER): Admitting: Surgical

## 2024-04-29 DIAGNOSIS — Z9889 Other specified postprocedural states: Secondary | ICD-10-CM

## 2024-05-01 ENCOUNTER — Encounter: Payer: Self-pay | Admitting: Surgical

## 2024-05-01 NOTE — Progress Notes (Signed)
 Post-Op Visit Note   Patient: Courtney Watson           Date of Birth: 08/17/1961           MRN: [id number] Visit Date: 04/29/2024 PCP: Alda Amas, PA-C   Assessment & Plan:  Chief Complaint:  Chief Complaint  Patient presents with   Right Shoulder - Routine Post Op, Follow-up     02/02/2024 right shoulder arthroscopy, BT, RCR      Visit Diagnoses:  No diagnosis found.   Plan: Patient is a 63 year old female who presents s/p right shoulder arthroscopy with bicep tenodesis and rotator cuff tear repair on 02/02/2024.  Continually improving and overall feels she is doing a lot better.  Still working with physical therapy upstairs primarily with Trevor Fudge and Corbin Dess.  She is enjoying this.  Not taking anything for pain.  The bicep region pain that she has had in the past has all but resolved with only occasional 2 to 3-second bursts of pain that she notices very sparingly.  She has started strengthening in PT.  Feels this is going well.    On exam, patient has 40 degrees external rotation, 90 degrees abduction, 150 degrees forward elevation passively and actively .  Incisions are well-healed.  Axillary nerve intact with deltoid firing.  5/5 strength of supra, infra, subscap.  Negative external rotation lag sign.  Negative Hornblower sign.  Palpable radial pulse of the operative extremity.  Plan is to continue with physical therapy with transition to home exercise program likely in July.  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  She will follow-up with us  as needed and is welcome to return if she has any concerns or new symptoms.  We did discuss exercises and maneuvers to avoid in the future long-term such as significant overhead lifting or lifting away from her body or certain exercises such as bench press/push-ups.  Follow-up as needed.  Overall she is done a very good job of rehabbing her shoulder.  Follow-Up Instructions: No follow-ups on  file.   Orders:  No orders of the defined types were placed in this encounter.  No orders of the defined types were placed in this encounter.   Imaging: No results found.  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   Synovitis of right shoulder 02/07/2024   Biceps tendonitis on right 02/07/2024   Pain in right shoulder 01/08/2024   Inflamed seborrheic keratosis 11/17/2023   Nevus of right shoulder 11/17/2023   Onychoschizia 11/17/2023   Sun-damaged skin 11/17/2023   Encounter for annual physical exam 11/07/2022   Arthritis pain of hand 11/07/2022   High cholesterol 02/19/2022   Essential hypertension 11/21/2021   Seasonal allergies    History of chicken pox    H/O rhinoplasty    Fibroid    Eating disorder    Colon polyps    Blood in stool    Abnormal uterine bleeding    B12 deficiency 04/09/2020   Left thyroid  nodule    Upper airway cough syndrome 04/07/2019   Malignant tumor of colon (HCC) 04/01/2018   Perimenopausal disorder 04/01/2018   Uterine leiomyoma 04/01/2018   Palpitations 04/01/2018   Irregular heart beat 04/01/2018   Ulcerative colitis (HCC) 01/25/2018   Vitamin D  deficiency  01/25/2018   Fatigue 01/25/2018   Shortness of breath on exertion 06/08/2017   Knee pain, right 08/22/2015   Cervical radiculopathy 01/10/2015   Anxiety 09/12/2014   Osteopenia 09/12/2014   Neck pain 09/12/2014   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   Cancer (HCC) 2005   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   Overview:  Clinically Left C4 Clinically Left C4    Cancer (HCC) 2005   colon    Cervical radiculopathy 01/10/2015   Colon polyps    Eating disorder    Fatigue 01/25/2018   Fibroid    H/O rhinoplasty    History of chicken pox    Hypertension    Irregular heart beat 04/01/2018   Knee pain, right 08/22/2015   Left thyroid  nodule    needs follow up ultrasound in 2021   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   Hypertension Father    Hyperlipidemia Father    Osteoarthritis Father    Rheum arthritis Father    Cancer Maternal Uncle 66       colon cancer   Depression Maternal Grandmother    Diabetes Maternal Grandfather    Heart disease Maternal Grandfather    Hypertension Maternal Grandfather    Breast cancer Paternal Grandmother    Cancer Paternal Grandfather     Past Surgical History:  Procedure Laterality Date   BICEPT TENODESIS Right 02/02/2024   Procedure: TENODESIS, BICEPS;  Surgeon: Jasmine Mesi, MD;  Location: [facility] OR;  Service: Orthopedics;  Laterality: Right;   COLON SURGERY  2005   colon cancer--Dr.Rosenbower   ENDOMETRIAL ABLATION  2012   Dr.Lomax   KNEE ARTHROSCOPY Right    MYOMECTOMY  2012   Dr.Lomax   POSTERIOR LUMBAR FUSION 2 WITH HARDWARE REMOVAL Right 02/02/2024   Procedure: ARTHROSCOPY, SHOULDER WITH DEBRIDEMENT;  Surgeon: Jasmine Mesi, MD;  Location: [facility] OR;  Service: Orthopedics;  Laterality: Right;   PROCEDURE:  RIGHT SHOULDER ARTHROSCOPY, DEBRIDEMENT, MINI OPEN BICEPS TENODESIS & ROTATOR CUFF TEAR REPAIR -SUPRASPINATUS AND SUBSCAPULARIS   SHOULDER OPEN ROTATOR CUFF REPAIR Right 02/02/2024   Procedure: REPAIR, ROTATOR CUFF, OPEN;  Surgeon: Jasmine Mesi, MD;  Location: [facility] OR;  Service: Orthopedics;  Laterality: Right;   Social History   Occupational History   Not on file  Tobacco Use   Smoking status: Former    Current packs/day: 0.00    Types: Cigarettes    Quit date: 11/18/2011    Years since quitting: 12.4    Passive exposure: Never   Smokeless tobacco: Never  Vaping Use   Vaping status: Every Day   Start date: 11/18/2011  Substance and Sexual Activity   Alcohol use: Yes    Alcohol/week: 4.0 standard drinks of alcohol    Types: 4 Cans of beer per week    Comment: on the weekends   Drug use: No   Sexual activity: Yes    Partners: Male    Birth control/protection: Post-menopausal    Comment: Ablation 2012

## 2024-05-02 ENCOUNTER — Ambulatory Visit: Admitting: Physical Therapy

## 2024-05-02 ENCOUNTER — Encounter: Payer: Self-pay | Admitting: Physical Therapy

## 2024-05-02 DIAGNOSIS — M6281 Muscle weakness (generalized): Secondary | ICD-10-CM

## 2024-05-02 DIAGNOSIS — M25511 Pain in right shoulder: Secondary | ICD-10-CM | POA: Diagnosis not present

## 2024-05-02 DIAGNOSIS — M25611 Stiffness of right shoulder, not elsewhere classified: Secondary | ICD-10-CM | POA: Diagnosis not present

## 2024-05-02 DIAGNOSIS — R293 Abnormal posture: Secondary | ICD-10-CM | POA: Diagnosis not present

## 2024-05-02 NOTE — Therapy (Signed)
 OUTPATIENT PHYSICAL THERAPY SHOULDER TREATMENT   Patient Name: Courtney Watson MRN: [id number] DOB:Oct 29, 1961, 63 y.o., female Today's Date: 05/02/2024  END OF SESSION:  PT End of Session - 05/02/24 1258     Visit Number 18    Number of Visits 27    Date for PT Re-Evaluation 06/02/24    Authorization Type [employer]    Authorization Time Period 02/26/24 to 04/22/24    PT Start Time 1258    PT Stop Time 1342    PT Time Calculation (min) 44 min    Activity Tolerance Patient tolerated treatment well    Behavior During Therapy [facility] for tasks assessed/performed             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   Malignant tumor of colon (HCC) 04/01/2018   Neck pain 09/12/2014   Osteopenia 2018   hips and spine   Palpitations 04/01/2018   Perimenopausal disorder 04/01/2018   Seasonal allergies    Shortness of breath on exertion 06/08/2017   Ulcerative colitis (HCC)    Uterine leiomyoma 04/01/2018   Vitamin D  deficiency    Past Surgical History:  Procedure Laterality Date   BICEPT TENODESIS Right 02/02/2024   Procedure: TENODESIS, BICEPS;  Surgeon: Jasmine Mesi, MD;  Location: [facility] OR;  Service: Orthopedics;  Laterality: Right;   COLON SURGERY  2005   colon cancer--Dr.Rosenbower   ENDOMETRIAL ABLATION  2012   Dr.Lomax   KNEE ARTHROSCOPY Right    MYOMECTOMY  2012   Dr.Lomax   POSTERIOR LUMBAR FUSION 2 WITH  HARDWARE REMOVAL Right 02/02/2024   Procedure: ARTHROSCOPY, SHOULDER WITH DEBRIDEMENT;  Surgeon: Jasmine Mesi, MD;  Location: [facility] OR;  Service: Orthopedics;  Laterality: Right;  PROCEDURE:  RIGHT SHOULDER ARTHROSCOPY, DEBRIDEMENT, MINI OPEN BICEPS TENODESIS & ROTATOR CUFF TEAR REPAIR -SUPRASPINATUS AND SUBSCAPULARIS   SHOULDER OPEN ROTATOR CUFF REPAIR Right 02/02/2024   Procedure: REPAIR, ROTATOR CUFF, OPEN;  Surgeon: Jasmine Mesi, MD;  Location: [facility] OR;  Service: Orthopedics;  Laterality: Right;   Patient Active Problem List   Diagnosis Date Noted   Nontraumatic tear of right rotator cuff 02/07/2024   Degenerative superior labral anterior-to-posterior (SLAP) tear of right shoulder 02/07/2024   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   Synovitis of right shoulder 02/07/2024   Biceps tendonitis on right 02/07/2024   Pain in right shoulder 01/08/2024   Inflamed seborrheic keratosis 11/17/2023   Nevus of right shoulder 11/17/2023   Onychoschizia 11/17/2023   Sun-damaged skin 11/17/2023   Encounter for annual physical exam 11/07/2022   Arthritis pain of hand 11/07/2022   High cholesterol 02/19/2022   Essential hypertension 11/21/2021   Seasonal allergies    History of chicken pox    H/O rhinoplasty    Fibroid    Eating disorder  Colon polyps    Blood in stool    Abnormal uterine bleeding    B12 deficiency 04/09/2020   Left thyroid  nodule    Upper airway cough syndrome 04/07/2019   Malignant tumor of colon (HCC) 04/01/2018   Perimenopausal disorder 04/01/2018   Uterine leiomyoma 04/01/2018   Palpitations 04/01/2018   Irregular heart beat 04/01/2018   Ulcerative colitis (HCC) 01/25/2018   Vitamin D  deficiency 01/25/2018   Fatigue 01/25/2018   Shortness of breath on exertion 06/08/2017   Knee pain, right 08/22/2015   Cervical radiculopathy 01/10/2015   Anxiety 09/12/2014   Osteopenia 09/12/2014   Neck pain 09/12/2014   Malignant neoplasm of large intestine (HCC)  09/12/2014   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   Cancer (HCC) 2005    PCP: Allwardt, [place]   REFERRING PROVIDER: Jean Michaelis  REFERRING DIAG: Please see note for PT instructions  Post right shoulder arthroscopy, RCR, BT on 02/02/2024  THERAPY DIAG:  Acute pain of right shoulder  Stiffness of right shoulder, not elsewhere classified  Muscle weakness (generalized)  Abnormal posture  Rationale for Evaluation and Treatment: Rehabilitation  ONSET DATE: Surgery 02/02/24   SUBJECTIVE:                                                                                                                                                                                      SUBJECTIVE STATEMENT: Van Gelinas PA said to continue PT 2x/wk for June & 1x/wk in July.    Hand dominance: Right  PERTINENT HISTORY: Right shoulder arthroscopy RTC repair suprapspinatus & subscapularis, biceps tenodesis 02/02/24, Colon CA 2005 with sg, Brachial neuritis, cervical radiculopathy, HTN  PAIN:  Yes: NPRS scale:  0/10 Pain location: right shoulder wrapping around whole shoulder  Pain description: random aches  Aggravating factors:  nothing  Relieving factors: ice or heat, stretching   PRECAUTIONS: Plan is continue with range of motion exercises.  Start physical therapy upstairs to focus on passive and active assisted range of motion of the right shoulder (okay to start active assisted range of motion on 03/01/24).  Okay to start full active range of motion and rotator cuff strengthening exercises on 03/15/2024.  Follow-up in 3 weeks for clinical recheck and determination of return to work potentially.  RED FLAGS: None   WEIGHT BEARING RESTRICTIONS: Yes NWB surgicial shoulder   FALLS:  Has patient fallen in last 6 months? 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  DIAGNOSTIC FINDINGS:   MRI   IMPRESSION: 1. Full-thickness tear of the anterior supraspinatus tendon footprint measuring up to approximately 9 mm in AP dimension and with up to only 4 mm tendon retraction. Mild to moderate supraspinatus muscle atrophy. 2. High-grade partial thickness tearing of the subscapularis tendon diffusely, with likely a full-thickness component superiorly and only minimal intact anterior fibers inserting inferiorly. 3. The long head of the biceps tendon is markedly attenuated just proximal to the bicipital groove, and there appears to be an at least 90% partial-thickness and possible full-thickness tear of the tendon as it courses over the lesser tuberosity into the bicipital groove. 4. Mild degenerative changes of the acromioclavicular joint.    PATIENT SURVEYS:   Patient-Specific Activity Scoring Scheme  0 represents "unable to perform." 10 represents "able to perform at prior level. 0 1 2 3 4 5 6 7 8 9  10 (Date and Score)   Activity Eval  04/21/24  1. Doing hair/make up  1  8  2. Cooking   3 10  3. Getting dressed  4 9  4. Pick up things  0 9  5. Brush teeth  4 10  6.  Write normally  2 9  Score 2.3 9.16   Total score = sum of the activity scores/number of activities Minimum detectable change (90%CI) for average score = 2 points Minimum detectable change (90%CI) for single activity score = 3 points  COGNITION: Overall cognitive status: Within functional limits for tasks assessed     SENSATION: WFL  POSTURE: Rounded shoulders, forward head   UPPER EXTREMITY ROM:    Passive ROM Right eval Right 03/07/24 Right 03/17/2024 Right 03/22/24 Right 04/12/24 Right 04/21/24  Shoulder flexion Initially 40*, able to get to 90* passive with repeated reps P: 98* 95* AAROM: 130 AROM: 130 AROM: 155  Shoulder extension      AROM: 50  Shoulder scaption  Initially about 30*, able to get to about 75* passive with repeated reps  P: 81*   AROM: 110  AROM: 140  Shoulder adduction        Shoulder internal rotation Hand to belly at 0* ABD   At 70 degrees abduction 45*   AROM: To T6  Shoulder external rotation Initially -30* at 0* ABD, able to get to about -20* passive with repeated reps  P: 38* at 45* abduction At 70 degrees abduction 35* 65 deg at 0 deg abd AROM: 80 deg at 0 deg abd AROM: 70 deg at 0 deg abd 40 deg at 90 deg abd  (Blank rows = not tested)  UPPER EXTREMITY MMT:  MMT Right eval Left eval  Shoulder flexion    Shoulder extension    Shoulder abduction    Shoulder adduction    Shoulder internal rotation    Shoulder external rotation    Middle trapezius    Lower trapezius    Elbow flexion    Elbow extension    Wrist flexion    Wrist extension    Wrist ulnar deviation    Wrist radial deviation    Wrist pronation    Wrist supination    Grip strength (lbs)    (Blank rows = not tested)  Did not check MMT at eval due  to post-op precautions    PALPATION:  Scar initially restricted but got it moving well with a bit of scar massage, encouraged this at home                                                                                                                              TREATMENT DATE:  05/02/2024 TherEx UBE L4 x 6 in (3 min each direction)  TherAct Isometric reactive right shoulder towel roll under arm to decrease substitutions  green theraband 10 reps ER, flex, IR & ext.  PT added to HEP with HO, demo & verbal cues.  Standing shoulder RUE flexion placing 2# 5 reps & 1# 5 reps on shoulder level, forehead level and overhead level shelf (touching  each shelf to reset muscle)  Standing shoulder RUE scapation / abduction placing 2# 5 reps & 1# 5 reps on shoulder level, forehead level and overhead level shelf (touching each shelf to reset muscle)  Standing shoulder BUE flexion placing 5# 5 reps on shoulder level, forehead level and overhead level shelf (touching each shelf to reset muscle). Reset shoulder posture at each shelf.  Pt verbalized understanding of above 4 exercises as updated HEP.     TREATMENT DATE:  04/28/24 TherEx UBE L4 x 6 in (3 min each direction) Sidelying Rt ER with 2# 3x10  TherAct Supine AA flexion with 5 sec hold at end range and 3# bar 3x10 for strength and ROM Supine Rt shoulder flexion full range with 5 sec hold 3x10; 2# Sidelying Rt shoulder abduction with 5 sec hold 3x10 Wall ladder flexion and scaption x 10 reps with 3 sec hold    04/26/24 TherAct Rows L4 band 3x10 Shoulder extension L4 band 3x10 Bicep curl into overhead press 3x10 on Rt, 2# within available range Bent over tricep press on Rt with 3#; 3x10 Wall push up 3x10 Multidirectional wall wash 1.5# x 10 reps each direction  TherEx UBE L3 x 6 in (3 min each direction) Standing shoulder flexion on Rt to 90 deg 2# 3x10 Standing shoulder abduction on Rt to 90 deg 1# 3x10    04/21/24 TherEx UBE L3 x 6 in (3 min each direction) Supine shoulder ER at 90 deg abd 1# 2x10 Rechecked ROM and goals  TherAct Supine shoulder flexion 1# 2x10 Sidelying shoulder abd 1# 2x10 palm down Sidelying shoulder abd 1# x10 thumb up Overhead press with 1# WaTE bar 2x10, Y x10 Seated forward chest press 1# 2x10 Seated overhead tricep ext 1# 2x10 Bicep curl 2# 2x10 Hammer curl 2# 2x10    HOME EXERCISE PROGRAM: Access Code: [email or web address] URL: [email or web address] Date: 05/02/2024 Prepared by: Lorie Rook  Exercises - Doorway Pec Stretch at 60 Degrees Abduction with Arm Straight  - 1 x daily - 7 x weekly - 2 sets - 30 sec hold - Doorway  Pec Stretch at 60 Elevation  - 1 x daily - 7 x weekly - 2 sets - 30  sec hold - Standing Scapular Retraction  - 5 x daily - 7 x weekly - 1 sets - 5 reps - 5 second hold - Shoulder External Rotation in 45 Degrees Abduction  - 1 x daily - 7 x weekly - 2 sets - 10 reps - Standing Overhead Shoulder External Rotation Stretch with Towel  - 1 x daily - 7 x weekly - 2 sets - 10 reps - Pec Minor Stretch  - 1 x daily - 7 x weekly - 2 sets - 10 reps - Shoulder Flexion Wall Slide with Towel  - 1 x daily - 7 x weekly - 3 sets - 10 reps - Scaption Wall Slide with Towel  - 1 x daily - 7 x weekly - 3 sets - 10 reps - Standing Shoulder Abduction Slides at Wall  - 1 x daily - 7 x weekly - 3 sets - 10 reps - Shoulder External Rotation Walkouts with Anchored Resistance  - 1 x daily - 7 x weekly - 2 sets - 10 reps - 5 seconds hold - Shoulder Internal Rotation Reactive Isometrics  - 1 x daily - 7 x weekly - 2 sets - 10 reps - 5 seconds hold - walking balance with band behind you  - 1 x daily - 7 x weekly - 2 sets - 10 reps - 5 seconds hold - Standing Shoulder Row Reactive Isometric  - 1 x daily - 7 x weekly - 2 sets - 10 reps - 5 seconds hold   ASSESSMENT:  CLINICAL IMPRESSION: Patient appears to understand updated HEP for strengthening.   Continue skilled PT.  OBJECTIVE IMPAIRMENTS: decreased ROM, decreased strength, increased edema, increased fascial restrictions, increased muscle spasms, impaired flexibility, impaired UE functional use, improper body mechanics, postural dysfunction, and pain.   ACTIVITY LIMITATIONS: carrying, lifting, bathing, toileting, dressing, self feeding, reach over head, hygiene/grooming, and caring for others  PARTICIPATION LIMITATIONS: meal prep, cleaning, laundry, driving, shopping, community activity, occupation, and yard work  PERSONAL FACTORS: Time since onset of injury/illness/exacerbation are also affecting patient's functional outcome.   REHAB POTENTIAL: Excellent  CLINICAL  DECISION MAKING: Stable/uncomplicated  EVALUATION COMPLEXITY: Low   GOALS: Goals reviewed with patient? No  SHORT TERM GOALS: Target date:  03/25/2024     Patient will be compliant with appropriate progressive HEP        GOAL STATUS: Met 03/17/2024  2. R shoulder flexion and ABD A/PROM to be at least 150*         GOAL STATUS: Ongoing  04/21/2024    3. R shoulder IR and ER A/PROM to be at least 45* at 90* ABD           GOAL STATUS: MET  04/21/2024   4. Will be more aware of posture with all functional tasks with use of ergonomic aides PRN/as desired      GOAL STATUS: MET 04/21/2024     LONG TERM GOALS: Target date: 06/02/2024    MMT in surgical shoulder to be at least 4/5 all tested groups  Baseline:  Goal status: Ongoing  05/02/2024    2.  Pain to be no more than 2/10 with all functional task performance  Baseline:  Goal status: MET 04/21/2024 -- no more than a 2  3.  Will be able to perform all personal care and hygiene without increase in pain or difficulty with surgical shoulder  Baseline:  Goal status: MET 04/21/2024 -- able to do her hair and dressing/bathing except donning/doffing bra  4.  Will be able to perform all home and work based tasks and requirements without increase in pain surgical shoulder  Baseline:  Goal status: MET 04/21/2024   5.  PSFS to have improved by at least 4 points  Baseline:  Goal status:  MET  04/21/2024    PLAN:  PT FREQUENCY: 1-2x/wk   PT DURATION: 6 weeks  PLANNED INTERVENTIONS: 97750- Physical Performance Testing, 97110-Therapeutic exercises, 97530- Therapeutic activity, [id number]- Neuromuscular re-education, 97535- Self Care, 10272- Manual therapy, [id number]- Aquatic Therapy, [id number]- Electrical stimulation (unattended), [id number]- Vasopneumatic device, [id number]- Ultrasound, 40347- Ionotophoresis 4mg /ml Dexamethasone , Patient/Family education, Taping, Dry Needling, Cryotherapy, and Moist heat  PLAN FOR NEXT SESSION: check on updated HEP.  continue  strengthening focus  Per MD note 03/16/24: Her strength is in a good spot for where she is at, considering she has not started any strengthening exercises yet. She should be okay to start rotator cuff strengthening exercises now. Continue with active motion.   NEXT MD VISIT: 04/29/24  Lorie Rook, PT, DPT 05/02/24 1:44 PM

## 2024-05-04 ENCOUNTER — Ambulatory Visit: Admitting: Physical Therapy

## 2024-05-04 ENCOUNTER — Encounter: Payer: Self-pay | Admitting: Physical Therapy

## 2024-05-04 DIAGNOSIS — R293 Abnormal posture: Secondary | ICD-10-CM

## 2024-05-04 DIAGNOSIS — M6281 Muscle weakness (generalized): Secondary | ICD-10-CM | POA: Diagnosis not present

## 2024-05-04 DIAGNOSIS — M25511 Pain in right shoulder: Secondary | ICD-10-CM | POA: Diagnosis not present

## 2024-05-04 DIAGNOSIS — M25611 Stiffness of right shoulder, not elsewhere classified: Secondary | ICD-10-CM

## 2024-05-04 NOTE — Therapy (Signed)
 OUTPATIENT PHYSICAL THERAPY SHOULDER TREATMENT   Patient Name: Courtney Watson MRN: [id number] DOB:1960-11-21, 62 y.o., female Today's Date: 05/04/2024  END OF SESSION:  PT End of Session - 05/04/24 1258     Visit Number 19    Number of Visits 27    Date for PT Re-Evaluation 06/02/24    Authorization Type [employer]    Authorization Time Period 02/26/24 to 04/22/24    PT Start Time 1300    PT Stop Time 1342    PT Time Calculation (min) 42 min    Activity Tolerance Patient tolerated treatment well    Behavior During Therapy [facility] for tasks assessed/performed              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   Malignant tumor of colon (HCC) 04/01/2018   Neck pain 09/12/2014   Osteopenia 2018   hips and spine   Palpitations 04/01/2018   Perimenopausal disorder 04/01/2018   Seasonal allergies    Shortness of breath on exertion 06/08/2017   Ulcerative colitis (HCC)    Uterine leiomyoma 04/01/2018   Vitamin D  deficiency    Past Surgical History:  Procedure Laterality Date   BICEPT TENODESIS Right 02/02/2024   Procedure: TENODESIS, BICEPS;  Surgeon: Jasmine Mesi, MD;  Location: [facility] OR;  Service: Orthopedics;  Laterality: Right;   COLON SURGERY  2005   colon cancer--Dr.Rosenbower   ENDOMETRIAL ABLATION  2012   Dr.Lomax   KNEE ARTHROSCOPY Right    MYOMECTOMY  2012   Dr.Lomax   POSTERIOR LUMBAR FUSION 2 WITH  HARDWARE REMOVAL Right 02/02/2024   Procedure: ARTHROSCOPY, SHOULDER WITH DEBRIDEMENT;  Surgeon: Jasmine Mesi, MD;  Location: [facility] OR;  Service: Orthopedics;  Laterality: Right;  PROCEDURE:  RIGHT SHOULDER ARTHROSCOPY, DEBRIDEMENT, MINI OPEN BICEPS TENODESIS & ROTATOR CUFF TEAR REPAIR -SUPRASPINATUS AND SUBSCAPULARIS   SHOULDER OPEN ROTATOR CUFF REPAIR Right 02/02/2024   Procedure: REPAIR, ROTATOR CUFF, OPEN;  Surgeon: Jasmine Mesi, MD;  Location: [facility] OR;  Service: Orthopedics;  Laterality: Right;   Patient Active Problem List   Diagnosis Date Noted   Nontraumatic tear of right rotator cuff 02/07/2024   Degenerative superior labral anterior-to-posterior (SLAP) tear of right shoulder 02/07/2024   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   Synovitis of right shoulder 02/07/2024   Biceps tendonitis on right 02/07/2024   Pain in right shoulder 01/08/2024   Inflamed seborrheic keratosis 11/17/2023   Nevus of right shoulder 11/17/2023   Onychoschizia 11/17/2023   Sun-damaged skin 11/17/2023   Encounter for annual physical exam 11/07/2022   Arthritis pain of hand 11/07/2022   High cholesterol 02/19/2022   Essential hypertension 11/21/2021   Seasonal allergies    History of chicken pox    H/O rhinoplasty    Fibroid    Eating  disorder    Colon polyps    Blood in stool    Abnormal uterine bleeding    B12 deficiency 04/09/2020   Left thyroid  nodule    Upper airway cough syndrome 04/07/2019   Malignant tumor of colon (HCC) 04/01/2018   Perimenopausal disorder 04/01/2018   Uterine leiomyoma 04/01/2018   Palpitations 04/01/2018   Irregular heart beat 04/01/2018   Ulcerative colitis (HCC) 01/25/2018   Vitamin D  deficiency 01/25/2018   Fatigue 01/25/2018   Shortness of breath on exertion 06/08/2017   Knee pain, right 08/22/2015   Cervical radiculopathy 01/10/2015   Anxiety 09/12/2014   Osteopenia 09/12/2014   Neck pain 09/12/2014   Malignant neoplasm of large intestine (HCC)  09/12/2014   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   Cancer (HCC) 2005    PCP: Allwardt, Alyssa PA-C   REFERRING PROVIDER: Jean Michaelis  REFERRING DIAG: Please see note for PT instructions  Post right shoulder arthroscopy, RCR, BT on 02/02/2024  THERAPY DIAG:  Acute pain of right shoulder  Muscle weakness (generalized)  Stiffness of right shoulder, not elsewhere classified  Abnormal posture  Rationale for Evaluation and Treatment: Rehabilitation  ONSET DATE: Surgery 02/02/24   SUBJECTIVE:                                                                                                                                                                                      SUBJECTIVE STATEMENT: Doing well; no pain today  Hand dominance: Right  PERTINENT HISTORY: Right shoulder arthroscopy RTC repair suprapspinatus & subscapularis, biceps tenodesis 02/02/24, Colon CA 2005 with sg, Brachial neuritis, cervical radiculopathy, HTN  PAIN:  Yes: NPRS scale:  0/10 Pain location: right shoulder wrapping around whole shoulder  Pain description: random aches  Aggravating factors:  nothing  Relieving factors: ice or heat, stretching   PRECAUTIONS: Plan is continue with range of motion exercises.  Start physical therapy upstairs to focus on passive and active assisted range of motion of the right shoulder (okay to start active assisted range of motion on 03/01/24).  Okay to start full active range of motion and rotator cuff strengthening exercises on 03/15/2024.  Follow-up in 3 weeks for clinical recheck and determination of return to work potentially.  RED FLAGS: None   WEIGHT BEARING RESTRICTIONS: Yes NWB surgicial shoulder   FALLS:  Has patient fallen in last 6 months? 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  DIAGNOSTIC FINDINGS:   MRI   IMPRESSION: 1. Full-thickness tear of the anterior supraspinatus tendon footprint measuring up to approximately 9 mm in AP dimension and with up to only 4 mm tendon retraction. Mild to moderate supraspinatus muscle atrophy. 2. High-grade partial thickness tearing of the subscapularis tendon diffusely, with likely a full-thickness component superiorly and only minimal intact anterior fibers inserting inferiorly. 3. The long head of the biceps tendon is markedly attenuated just proximal to the bicipital groove, and there appears to be an at least 90% partial-thickness and possible full-thickness tear of the tendon as it courses over the lesser tuberosity into the bicipital groove. 4. Mild degenerative changes of the acromioclavicular joint.    PATIENT SURVEYS:   Patient-Specific Activity Scoring Scheme  0 represents "unable to perform." 10 represents "able to perform at prior level. 0 1 2 3 4 5 6 7 8 9  10 (Date and Score)   Activity Eval  04/21/24  1. Doing hair/make up  1  8  2. Cooking   3 10  3. Getting dressed  4 9  4. Pick up things  0 9  5. Brush teeth  4 10  6.  Write normally  2 9  Score 2.3 9.16   Total score = sum of the activity scores/number of activities Minimum detectable change (90%CI) for average score = 2 points Minimum detectable change (90%CI) for single activity score = 3 points  COGNITION: Overall cognitive status: Within functional limits for tasks assessed     SENSATION: WFL  POSTURE: Rounded shoulders, forward head   UPPER EXTREMITY ROM:   Passive ROM Right eval Right 03/07/24 Right  03/17/2024 Right 03/22/24 Right 04/12/24 Right 04/21/24  Shoulder flexion Initially 40*, able to get to 90* passive with repeated reps P: 98* 95* AAROM: 130 AROM: 130 AROM: 155  Shoulder extension      AROM: 50  Shoulder scaption  Initially about 30*, able to get to about 75* passive with repeated reps  P: 81*   AROM: 110  AROM: 140  Shoulder adduction        Shoulder internal rotation Hand to belly at 0* ABD   At 70 degrees abduction 45*   AROM: To T6  Shoulder external rotation Initially -30* at 0* ABD, able to get to about -20* passive with repeated reps  P: 38* at 45* abduction At 70 degrees abduction 35* 65 deg at 0 deg abd AROM: 80 deg at 0 deg abd AROM: 70 deg at 0 deg abd 40 deg at 90 deg abd  (Blank rows = not tested)  UPPER EXTREMITY MMT:  MMT Right eval Left eval  Shoulder flexion    Shoulder extension    Shoulder abduction    Shoulder adduction    Shoulder internal rotation    Shoulder external rotation    Middle trapezius    Lower trapezius    Elbow flexion    Elbow extension    Wrist flexion    Wrist extension    Wrist ulnar deviation    Wrist radial deviation    Wrist pronation    Wrist supination    Grip strength (lbs)    (Blank rows = not tested)  Did not check MMT at eval due to post-op precautions  PALPATION:  Scar initially restricted but got it moving well with a bit of scar massage, encouraged this at home                                                                                                                              TREATMENT DATE:  05/04/24 TherEx UBE L5 x 6 in (3 min each direction)  TherAct Rows 20# 3x10 Rt shoulder reactive isometrics ER/IR L3 band 2x10 Wall ladder flexion and scaption with 5 sec hold x 10 reps each direction; 2# wrist weight Standing functional reach to cabinet shelves (all 3 heights) x 10 each in flexion (2#) and scaption (2#) Plank at counter with alternating shoulder taps 2x10 bil Quadruped hip extension x10  reps bil (for shoulder stabilization) Quadruped serratus push up 2x10   05/02/2024 TherEx UBE L4 x 6 in (3 min each direction)  TherAct Isometric reactive right shoulder towel roll under arm to decrease substitutions  green theraband 10 reps ER, flex, IR & ext.  PT added to HEP with HO, demo & verbal cues.  Standing shoulder RUE flexion placing 2# 5 reps & 1# 5 reps on shoulder level, forehead level and overhead level shelf (touching each shelf to reset muscle)  Standing shoulder RUE scapation / abduction placing 2# 5 reps & 1# 5 reps on shoulder level, forehead level and overhead level shelf (touching each shelf to reset muscle)  Standing shoulder BUE flexion placing 5# 5 reps on shoulder level, forehead level and overhead level shelf (touching each shelf to reset muscle). Reset shoulder posture at each shelf.  Pt verbalized understanding of above 4 exercises as updated HEP.    04/28/24 TherEx UBE L4 x 6 in (3 min each direction) Sidelying Rt ER with 2# 3x10  TherAct Supine AA flexion with 5 sec hold at end range and 3# bar 3x10 for strength and ROM Supine Rt shoulder flexion full range with 5 sec hold 3x10; 2# Sidelying Rt shoulder abduction with 5 sec hold 3x10 Wall ladder flexion and scaption x 10 reps with 3 sec hold    04/26/24 TherAct Rows L4 band 3x10 Shoulder extension L4 band 3x10 Bicep curl into overhead press 3x10 on Rt, 2# within available range Bent over tricep press on Rt with 3#; 3x10 Wall push up 3x10 Multidirectional wall wash 1.5# x 10 reps each direction  TherEx UBE L3 x 6 in (3 min each direction) Standing shoulder flexion on Rt to 90 deg 2# 3x10 Standing shoulder abduction on Rt to 90 deg 1# 3x10    HOME EXERCISE PROGRAM: Access Code: [email or web address] URL: [email or web address] Date: 05/02/2024 Prepared by: Lorie Rook  Exercises - Doorway Pec Stretch at 60 Degrees Abduction with Arm Straight  - 1 x daily - 7 x weekly - 2 sets - 30 sec  hold - Doorway Pec Stretch at 60 Elevation  - 1 x daily - 7 x weekly - 2 sets - 30 sec hold -  Standing Scapular Retraction  - 5 x daily - 7 x weekly - 1 sets - 5 reps - 5 second hold - Shoulder External Rotation in 45 Degrees Abduction  - 1 x daily - 7 x weekly - 2 sets - 10 reps - Standing Overhead Shoulder External Rotation Stretch with Towel  - 1 x daily - 7 x weekly - 2 sets - 10 reps - Pec Minor Stretch  - 1 x daily - 7 x weekly - 2 sets - 10 reps - Shoulder Flexion Wall Slide with Towel  - 1 x daily - 7 x weekly - 3 sets - 10 reps - Scaption Wall Slide with Towel  - 1 x daily - 7 x weekly - 3 sets - 10 reps - Standing Shoulder Abduction Slides at Wall  - 1 x daily - 7 x weekly - 3 sets - 10 reps - Shoulder External Rotation Walkouts with Anchored Resistance  - 1 x daily - 7 x weekly - 2 sets - 10 reps - 5 seconds hold - Shoulder Internal Rotation Reactive Isometrics  - 1 x daily - 7 x weekly - 2 sets - 10 reps - 5 seconds hold - walking balance with band behind you  - 1 x daily - 7 x weekly - 2 sets - 10 reps - 5 seconds hold - Standing Shoulder Row Reactive Isometric  - 1 x daily - 7 x weekly - 2 sets - 10 reps - 5 seconds hold   ASSESSMENT:  CLINICAL IMPRESSION: Pt tolerated session well today with continued focus on functional strengthening, continue skilled PT to maximize function.  OBJECTIVE IMPAIRMENTS: decreased ROM, decreased strength, increased edema, increased fascial restrictions, increased muscle spasms, impaired flexibility, impaired UE functional use, improper body mechanics, postural dysfunction, and pain.   ACTIVITY LIMITATIONS: carrying, lifting, bathing, toileting, dressing, self feeding, reach over head, hygiene/grooming, and caring for others  PARTICIPATION LIMITATIONS: meal prep, cleaning, laundry, driving, shopping, community activity, occupation, and yard work  PERSONAL FACTORS: Time since onset of injury/illness/exacerbation are also affecting patient's  functional outcome.   REHAB POTENTIAL: Excellent  CLINICAL DECISION MAKING: Stable/uncomplicated  EVALUATION COMPLEXITY: Low   GOALS: Goals reviewed with patient? No  SHORT TERM GOALS: Target date:  03/25/2024     Patient will be compliant with appropriate progressive HEP        GOAL STATUS: Met 03/17/2024  2. R shoulder flexion and ABD A/PROM to be at least 150*         GOAL STATUS: Ongoing  04/21/2024    3. R shoulder IR and ER A/PROM to be at least 45* at 90* ABD           GOAL STATUS: MET  04/21/2024   4. Will be more aware of posture with all functional tasks with use of ergonomic aides PRN/as desired      GOAL STATUS: MET 04/21/2024     LONG TERM GOALS: Target date: 06/02/2024    MMT in surgical shoulder to be at least 4/5 all tested groups  Baseline:  Goal status: Ongoing  05/02/2024    2.  Pain to be no more than 2/10 with all functional task performance  Baseline:  Goal status: MET 04/21/2024 -- no more than a 2  3.  Will be able to perform all personal care and hygiene without increase in pain or difficulty with surgical shoulder  Baseline:  Goal status: MET 04/21/2024 -- able to do her hair and dressing/bathing except donning/doffing bra  4.  Will be able to perform all home and work based tasks and requirements without increase in pain surgical shoulder  Baseline:  Goal status: MET 04/21/2024   5.  PSFS to have improved by at least 4 points  Baseline:  Goal status:  MET  04/21/2024    PLAN:  PT FREQUENCY: 1-2x/wk   PT DURATION: 6 weeks  PLANNED INTERVENTIONS: 97750- Physical Performance Testing, 97110-Therapeutic exercises, 97530- Therapeutic activity, 97112- Neuromuscular re-education, 97535- Self Care, 54098- Manual therapy, [id number]- Aquatic Therapy, [id number]- Electrical stimulation (unattended), [id number]- Vasopneumatic device, [id number]- Ultrasound, 78295- Ionotophoresis 4mg /ml Dexamethasone , Patient/Family education, Taping, Dry Needling, Cryotherapy, and Moist  heat  PLAN FOR NEXT SESSION: .  continue strengthening focus  Per MD note 03/16/24: Her strength is in a good spot for where she is at, considering she has not started any strengthening exercises yet. She should be okay to start rotator cuff strengthening exercises now. Continue with active motion.   NEXT MD VISIT: no follow up scheduled  Marley Simmers, PT, DPT 05/04/24 1:45 PM

## 2024-05-11 ENCOUNTER — Encounter: Admitting: Physical Therapy

## 2024-05-13 ENCOUNTER — Encounter: Admitting: Physical Therapy

## 2024-05-16 ENCOUNTER — Ambulatory Visit: Admitting: Physical Therapy

## 2024-05-16 ENCOUNTER — Encounter: Payer: Self-pay | Admitting: Physical Therapy

## 2024-05-16 DIAGNOSIS — M25511 Pain in right shoulder: Secondary | ICD-10-CM

## 2024-05-16 DIAGNOSIS — M25611 Stiffness of right shoulder, not elsewhere classified: Secondary | ICD-10-CM | POA: Diagnosis not present

## 2024-05-16 DIAGNOSIS — R293 Abnormal posture: Secondary | ICD-10-CM

## 2024-05-16 DIAGNOSIS — M6281 Muscle weakness (generalized): Secondary | ICD-10-CM | POA: Diagnosis not present

## 2024-05-16 NOTE — Therapy (Signed)
 OUTPATIENT PHYSICAL THERAPY SHOULDER TREATMENT   Patient Name: Courtney Watson MRN: [id number] DOB:1961/05/25, 63 y.o., female Today's Date: 05/16/2024  END OF SESSION:  PT End of Session - 05/16/24 1349     Visit Number 20    Number of Visits 27    Date for PT Re-Evaluation 06/02/24    Authorization Type [employer]    PT Start Time 1345    PT Stop Time 1424    PT Time Calculation (min) 39 min    Activity Tolerance Patient tolerated treatment well    Behavior During Therapy WFL for tasks assessed/performed               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   Malignant tumor of colon (HCC) 04/01/2018   Neck pain 09/12/2014   Osteopenia 2018   hips and spine   Palpitations 04/01/2018   Perimenopausal disorder 04/01/2018   Seasonal allergies    Shortness of breath on exertion 06/08/2017   Ulcerative colitis (HCC)    Uterine leiomyoma 04/01/2018   Vitamin D  deficiency    Past Surgical History:  Procedure Laterality Date   BICEPT TENODESIS Right 02/02/2024   Procedure: TENODESIS, BICEPS;  Surgeon: Addie Cordella Hamilton, MD;  Location: [facility] OR;  Service: Orthopedics;  Laterality: Right;   COLON SURGERY  2005   colon cancer--Dr.Rosenbower   ENDOMETRIAL ABLATION  2012   Dr.Lomax   KNEE ARTHROSCOPY Right    MYOMECTOMY  2012   Dr.Lomax   POSTERIOR LUMBAR FUSION 2 WITH HARDWARE REMOVAL Right 02/02/2024   Procedure:  ARTHROSCOPY, SHOULDER WITH DEBRIDEMENT;  Surgeon: Addie Cordella Hamilton, MD;  Location: [facility] OR;  Service: Orthopedics;  Laterality: Right;  PROCEDURE:  RIGHT SHOULDER ARTHROSCOPY, DEBRIDEMENT, MINI OPEN BICEPS TENODESIS & ROTATOR CUFF TEAR REPAIR -SUPRASPINATUS AND SUBSCAPULARIS   SHOULDER OPEN ROTATOR CUFF REPAIR Right 02/02/2024   Procedure: REPAIR, ROTATOR CUFF, OPEN;  Surgeon: Addie Cordella Hamilton, MD;  Location: [facility] OR;  Service: Orthopedics;  Laterality: Right;   Patient Active Problem List   Diagnosis Date Noted   Nontraumatic tear of right rotator cuff 02/07/2024   Degenerative superior labral anterior-to-posterior (SLAP) tear of right shoulder 02/07/2024   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   Synovitis of right shoulder 02/07/2024   Biceps tendonitis on right 02/07/2024   Pain in right shoulder 01/08/2024   Inflamed seborrheic keratosis 11/17/2023   Nevus of right shoulder 11/17/2023   Onychoschizia 11/17/2023   Sun-damaged skin 11/17/2023   Encounter for annual physical exam 11/07/2022   Arthritis pain of hand 11/07/2022   High cholesterol 02/19/2022   Essential hypertension 11/21/2021   Seasonal allergies    History of chicken pox    H/O rhinoplasty    Fibroid    Eating disorder    Colon polyps  Blood in stool    Abnormal uterine bleeding    B12 deficiency 04/09/2020   Left thyroid  nodule    Upper airway cough syndrome 04/07/2019   Malignant tumor of colon (HCC) 04/01/2018   Perimenopausal disorder 04/01/2018   Uterine leiomyoma 04/01/2018   Palpitations 04/01/2018   Irregular heart beat 04/01/2018   Ulcerative colitis (HCC) 01/25/2018   Vitamin D  deficiency 01/25/2018   Fatigue 01/25/2018   Shortness of breath on exertion 06/08/2017   Knee pain, right 08/22/2015   Cervical radiculopathy 01/10/2015   Anxiety 09/12/2014   Osteopenia 09/12/2014   Neck pain 09/12/2014   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   Brachial neuritis or radiculitis  09/12/2014   Cancer (HCC) 2005    PCP: Allwardt, Alyssa PA-C   REFERRING PROVIDER: Shirly Carlin RIGGERS  REFERRING DIAG: Please see note for PT instructions  Post right shoulder arthroscopy, RCR, BT on 02/02/2024  THERAPY DIAG:  Acute pain of right shoulder  Muscle weakness (generalized)  Stiffness of right shoulder, not elsewhere classified  Abnormal posture  Rationale for Evaluation and Treatment: Rehabilitation  ONSET DATE: Surgery 02/02/24   SUBJECTIVE:                                                                                                                                                                                      SUBJECTIVE STATEMENT: No pain, shoulder is doing well.   Hand dominance: Right  PERTINENT HISTORY: Right shoulder arthroscopy RTC repair suprapspinatus & subscapularis, biceps tenodesis 02/02/24, Colon CA 2005 with sg, Brachial neuritis, cervical radiculopathy, HTN  PAIN:  Yes: NPRS scale:  0/10 Pain location: right shoulder wrapping around whole shoulder  Pain description: random aches  Aggravating factors:  nothing  Relieving factors: ice or heat, stretching   PRECAUTIONS: Plan is continue with range of motion exercises.  Start physical therapy upstairs to focus on passive and active assisted range of motion of the right shoulder (okay to start active assisted range of motion on 03/01/24).  Okay to start full active range of motion and rotator cuff strengthening exercises on 03/15/2024.  Follow-up in 3 weeks for clinical recheck and determination of return to work potentially.  RED FLAGS: None   WEIGHT BEARING RESTRICTIONS: Yes NWB surgicial shoulder   FALLS:  Has patient fallen in last 6 months? 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  DIAGNOSTIC FINDINGS:   MRI   IMPRESSION: 1. Full-thickness tear of the anterior supraspinatus tendon footprint measuring up to approximately 9 mm in AP dimension and with up to only 4 mm tendon retraction. Mild to moderate supraspinatus muscle atrophy. 2. High-grade partial thickness tearing of the subscapularis tendon diffusely, with likely a full-thickness component superiorly and only minimal intact anterior fibers inserting inferiorly. 3. The long head of the biceps tendon is markedly attenuated just proximal to the bicipital groove, and there appears to be an at least 90% partial-thickness and possible full-thickness tear of the tendon as it courses over the lesser tuberosity into the bicipital groove. 4. Mild degenerative changes of the acromioclavicular joint.    PATIENT SURVEYS:   Patient-Specific Activity Scoring Scheme  0 represents "unable to perform." 10 represents "able to perform at prior level. 0 1 2 3 4 5 6 7 8 9  10 (Date and Score)   Activity Eval  04/21/24  1. Doing hair/make up  1  8  2. Cooking   3 10  3. Getting dressed  4 9  4. Pick up things  0 9  5. Brush teeth  4 10  6.  Write normally  2 9  Score 2.3 9.16   Total score = sum of the activity scores/number of activities Minimum detectable change (90%CI) for average score = 2 points Minimum detectable change (90%CI) for single activity score = 3 points  COGNITION: Overall cognitive status: Within functional limits for tasks assessed     SENSATION: WFL  POSTURE: Rounded shoulders, forward head   UPPER EXTREMITY ROM:   Passive ROM Right eval Right 03/07/24 Right  03/17/2024 Right  03/22/24 Right   04/12/24 Right 04/21/24 Right 05/16/24  Shoulder flexion Initially 40*, able to get to 90* passive with repeated reps P: 98* 95* AAROM: 130 AROM: 130 AROM: 155 A: 126 (standing)  Shoulder extension      AROM: 50   Shoulder scaption  Initially about 30*, able to get to about 75* passive with repeated reps  P: 81*   AROM: 110  AROM: 140 A: 145 (standing)  Shoulder adduction         Shoulder internal rotation Hand to belly at 0* ABD   At 70 degrees abduction 45*   AROM: To T6 A: to T11  Shoulder external rotation Initially -30* at 0* ABD, able to get to about -20* passive with repeated reps  P: 38* at 45* abduction At 70 degrees abduction 35* 65 deg at 0 deg abd AROM: 80 deg at 0 deg abd AROM: 70 deg at 0 deg abd 40 deg at 90 deg abd A: 73 (standing)  (Blank rows = not tested)  UPPER EXTREMITY MMT:  MMT Right 05/16/24 Left 05/16/24  Shoulder flexion 6.1, 6.2 (6.15#) 11.3, 12.0 (11.65#)  Shoulder extension    Shoulder abduction    Shoulder adduction 5.4, 4.6 (5#) 9.5, 8.8 (9.15#)  Shoulder internal rotation    Shoulder external rotation 11.3, 10.0 (10.65#) 15.6, 14.1 (14.85#)  (Blank rows = not tested)  Did not check MMT at eval due to post-op precautions    PALPATION:  Scar initially restricted but got it moving well with a bit of scar massage, encouraged this at home  TREATMENT DATE:  05/16/24 TherEx UBE L5 x 6 in (3 min each direction) Standing Rt shoulder ER L4  2x15 Standing Rt shoulder IR L4 2x15 ROM and MMT - see above for details  TherAct Shoulder flexion/abduction circuit 2x15 bil; 2# weights Bicep curl with overhead press/tricep extension circuit 2x15 bil; 2# weights L3 band drive the bus x 15 reps Alternating shoulder taps in counter plank x 15 bil    05/04/24 TherEx UBE L5 x 6 in (3 min each direction)  TherAct Rows 20# 3x10 Rt  shoulder reactive isometrics ER/IR L3 band 2x10 Wall ladder flexion and scaption with 5 sec hold x 10 reps each direction; 2# wrist weight Standing functional reach to cabinet shelves (all 3 heights) x 10 each in flexion (2#) and scaption (2#) Plank at counter with alternating shoulder taps 2x10 bil Quadruped hip extension x10 reps bil (for shoulder stabilization) Quadruped serratus push up 2x10   05/02/2024 TherEx UBE L4 x 6 in (3 min each direction)  TherAct Isometric reactive right shoulder towel roll under arm to decrease substitutions  green theraband 10 reps ER, flex, IR & ext.  PT added to HEP with HO, demo & verbal cues.  Standing shoulder RUE flexion placing 2# 5 reps & 1# 5 reps on shoulder level, forehead level and overhead level shelf (touching each shelf to reset muscle)  Standing shoulder RUE scapation / abduction placing 2# 5 reps & 1# 5 reps on shoulder level, forehead level and overhead level shelf (touching each shelf to reset muscle)  Standing shoulder BUE flexion placing 5# 5 reps on shoulder level, forehead level and overhead level shelf (touching each shelf to reset muscle). Reset shoulder posture at each shelf.  Pt verbalized understanding of above 4 exercises as updated HEP.    04/28/24 TherEx UBE L4 x 6 in (3 min each direction) Sidelying Rt ER with 2# 3x10  TherAct Supine AA flexion with 5 sec hold at end range and 3# bar 3x10 for strength and ROM Supine Rt shoulder flexion full range with 5 sec hold 3x10; 2# Sidelying Rt shoulder abduction with 5 sec hold 3x10 Wall ladder flexion and scaption x 10 reps with 3 sec hold    04/26/24 TherAct Rows L4 band 3x10 Shoulder extension L4 band 3x10 Bicep curl into overhead press 3x10 on Rt, 2# within available range Bent over tricep press on Rt with 3#; 3x10 Wall push up 3x10 Multidirectional wall wash 1.5# x 10 reps each direction  TherEx UBE L3 x 6 in (3 min each direction) Standing shoulder flexion on Rt to  90 deg 2# 3x10 Standing shoulder abduction on Rt to 90 deg 1# 3x10    HOME EXERCISE PROGRAM: Access Code: [email or web address] URL: [email or web address] Date: 05/02/2024 Prepared by: Grayce Spatz  Exercises - Doorway Pec Stretch at 60 Degrees Abduction with Arm Straight  - 1 x daily - 7 x weekly - 2 sets - 30 sec hold - Doorway Pec Stretch at 60 Elevation  - 1 x daily - 7 x weekly - 2 sets - 30 sec hold - Standing Scapular Retraction  - 5 x daily - 7 x weekly - 1 sets - 5 reps - 5 second hold - Shoulder External Rotation in 45 Degrees Abduction  - 1 x daily - 7 x weekly - 2 sets - 10 reps - Standing Overhead Shoulder External Rotation Stretch with Towel  - 1 x daily - 7 x weekly - 2 sets - 10 reps - Pec Minor Stretch  -  1 x daily - 7 x weekly - 2 sets - 10 reps - Shoulder Flexion Wall Slide with Towel  - 1 x daily - 7 x weekly - 3 sets - 10 reps - Scaption Wall Slide with Towel  - 1 x daily - 7 x weekly - 3 sets - 10 reps - Standing Shoulder Abduction Slides at Wall  - 1 x daily - 7 x weekly - 3 sets - 10 reps - Shoulder External Rotation Walkouts with Anchored Resistance  - 1 x daily - 7 x weekly - 2 sets - 10 reps - 5 seconds hold - Shoulder Internal Rotation Reactive Isometrics  - 1 x daily - 7 x weekly - 2 sets - 10 reps - 5 seconds hold - walking balance with band behind you  - 1 x daily - 7 x weekly - 2 sets - 10 reps - 5 seconds hold - Standing Shoulder Row Reactive Isometric  - 1 x daily - 7 x weekly - 2 sets - 10 reps - 5 seconds hold   ASSESSMENT:  CLINICAL IMPRESSION: Pt tolerated session well today, still having strength deficits at this time compared to Lt shoulder.  Will continue to benefit from PT to maximize function.  OBJECTIVE IMPAIRMENTS: decreased ROM, decreased strength, increased edema, increased fascial restrictions, increased muscle spasms, impaired flexibility, impaired UE functional use, improper body mechanics, postural dysfunction, and pain.    ACTIVITY LIMITATIONS: carrying, lifting, bathing, toileting, dressing, self feeding, reach over head, hygiene/grooming, and caring for others  PARTICIPATION LIMITATIONS: meal prep, cleaning, laundry, driving, shopping, community activity, occupation, and yard work  PERSONAL FACTORS: Time since onset of injury/illness/exacerbation are also affecting patient's functional outcome.   REHAB POTENTIAL: Excellent  CLINICAL DECISION MAKING: Stable/uncomplicated  EVALUATION COMPLEXITY: Low   GOALS: Goals reviewed with patient? No  SHORT TERM GOALS: Target date:  03/25/2024     Patient will be compliant with appropriate progressive HEP        GOAL STATUS: Met 03/17/2024  2. R shoulder flexion and ABD A/PROM to be at least 150*         GOAL STATUS: Ongoing  04/21/2024    3. R shoulder IR and ER A/PROM to be at least 45* at 90* ABD           GOAL STATUS: MET  04/21/2024   4. Will be more aware of posture with all functional tasks with use of ergonomic aides PRN/as desired      GOAL STATUS: MET 04/21/2024     LONG TERM GOALS: Target date: 06/02/2024    MMT in surgical shoulder to be at least 4/5 all tested groups  Baseline:  Goal status: Ongoing  05/02/2024    2.  Pain to be no more than 2/10 with all functional task performance  Baseline:  Goal status: MET 04/21/2024 -- no more than a 2  3.  Will be able to perform all personal care and hygiene without increase in pain or difficulty with surgical shoulder  Baseline:  Goal status: MET 04/21/2024 -- able to do her hair and dressing/bathing except donning/doffing bra  4.  Will be able to perform all home and work based tasks and requirements without increase in pain surgical shoulder  Baseline:  Goal status: MET 04/21/2024   5.  PSFS to have improved by at least 4 points  Baseline:  Goal status:  MET  04/21/2024    PLAN:  PT FREQUENCY: 1-2x/wk   PT DURATION: 6 weeks  PLANNED INTERVENTIONS: 97750- Physical Performance Testing,  97110-Therapeutic exercises, 97530- Therapeutic activity, [id number]- Neuromuscular re-education, 97535- Self Care, [id number]- Manual therapy, [phone]- Aquatic Therapy, [phone]- Electrical stimulation (unattended), [id number]- Vasopneumatic device, [id number]- Ultrasound, [id number]- Ionotophoresis 4mg /ml Dexamethasone , Patient/Family education, Taping, Dry Needling, Cryotherapy, and Moist heat  PLAN FOR NEXT SESSION: . continue strengthening focus  Per MD note 03/16/24: Her strength is in a good spot for where she is at, considering she has not started any strengthening exercises yet. She should be okay to start rotator cuff strengthening exercises now. Continue with active motion.   NEXT MD VISIT: no follow up scheduled  Corean JULIANNA Ku, PT, DPT 05/16/24 2:25 PM

## 2024-05-17 ENCOUNTER — Encounter: Admitting: Physical Therapy

## 2024-05-18 ENCOUNTER — Encounter: Admitting: Physical Therapy

## 2024-05-25 ENCOUNTER — Encounter: Payer: Self-pay | Admitting: Physical Therapy

## 2024-05-25 ENCOUNTER — Ambulatory Visit: Admitting: Physical Therapy

## 2024-05-25 DIAGNOSIS — M6281 Muscle weakness (generalized): Secondary | ICD-10-CM | POA: Diagnosis not present

## 2024-05-25 DIAGNOSIS — R293 Abnormal posture: Secondary | ICD-10-CM | POA: Diagnosis not present

## 2024-05-25 DIAGNOSIS — M25511 Pain in right shoulder: Secondary | ICD-10-CM

## 2024-05-25 DIAGNOSIS — M25611 Stiffness of right shoulder, not elsewhere classified: Secondary | ICD-10-CM

## 2024-05-25 NOTE — Therapy (Signed)
 OUTPATIENT PHYSICAL THERAPY SHOULDER TREATMENT   Patient Name: Courtney Watson MRN: [id number] DOB:1961-01-24, 63 y.o., female Today's Date: 05/25/2024  END OF SESSION:  PT End of Session - 05/25/24 1433     Visit Number 21    Number of Visits 27    Date for PT Re-Evaluation 06/02/24    Authorization Type [employer]    PT Start Time 1431    PT Stop Time 1513    PT Time Calculation (min) 42 min    Activity Tolerance Patient tolerated treatment well    Behavior During Therapy [facility] for tasks assessed/performed                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   Malignant tumor of colon (HCC) 04/01/2018   Neck pain 09/12/2014   Osteopenia 2018   hips and spine   Palpitations 04/01/2018   Perimenopausal disorder 04/01/2018   Seasonal allergies    Shortness of breath on exertion 06/08/2017   Ulcerative colitis (HCC)    Uterine leiomyoma 04/01/2018   Vitamin D  deficiency    Past Surgical History:  Procedure Laterality Date   BICEPT TENODESIS Right 02/02/2024   Procedure: TENODESIS, BICEPS;  Surgeon: Addie Cordella Hamilton, MD;  Location: [facility] OR;  Service: Orthopedics;  Laterality: Right;   COLON SURGERY  2005   colon cancer--Dr.Rosenbower   ENDOMETRIAL ABLATION  2012   Dr.Lomax   KNEE ARTHROSCOPY Right    MYOMECTOMY  2012   Dr.Lomax   POSTERIOR LUMBAR FUSION 2 WITH HARDWARE REMOVAL Right 02/02/2024   Procedure:  ARTHROSCOPY, SHOULDER WITH DEBRIDEMENT;  Surgeon: Addie Cordella Hamilton, MD;  Location: [facility] OR;  Service: Orthopedics;  Laterality: Right;  PROCEDURE:  RIGHT SHOULDER ARTHROSCOPY, DEBRIDEMENT, MINI OPEN BICEPS TENODESIS & ROTATOR CUFF TEAR REPAIR -SUPRASPINATUS AND SUBSCAPULARIS   SHOULDER OPEN ROTATOR CUFF REPAIR Right 02/02/2024   Procedure: REPAIR, ROTATOR CUFF, OPEN;  Surgeon: Addie Cordella Hamilton, MD;  Location: [facility] OR;  Service: Orthopedics;  Laterality: Right;   Patient Active Problem List   Diagnosis Date Noted   Nontraumatic tear of right rotator cuff 02/07/2024   Degenerative superior labral anterior-to-posterior (SLAP) tear of right shoulder 02/07/2024   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   Synovitis of right shoulder 02/07/2024   Biceps tendonitis on right 02/07/2024   Pain in right shoulder 01/08/2024   Inflamed seborrheic keratosis 11/17/2023   Nevus of right shoulder 11/17/2023   Onychoschizia 11/17/2023   Sun-damaged skin 11/17/2023   Encounter for annual physical exam 11/07/2022   Arthritis pain of hand 11/07/2022   High cholesterol 02/19/2022   Essential hypertension 11/21/2021   Seasonal allergies    History of chicken pox    H/O rhinoplasty    Fibroid    Eating disorder    Colon polyps  Blood in stool    Abnormal uterine bleeding    B12 deficiency 04/09/2020   Left thyroid  nodule    Upper airway cough syndrome 04/07/2019   Malignant tumor of colon (HCC) 04/01/2018   Perimenopausal disorder 04/01/2018   Uterine leiomyoma 04/01/2018   Palpitations 04/01/2018   Irregular heart beat 04/01/2018   Ulcerative colitis (HCC) 01/25/2018   Vitamin D  deficiency 01/25/2018   Fatigue 01/25/2018   Shortness of breath on exertion 06/08/2017   Knee pain, right 08/22/2015   Cervical radiculopathy 01/10/2015   Anxiety 09/12/2014   Osteopenia 09/12/2014   Neck pain 09/12/2014   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   Brachial neuritis or radiculitis  09/12/2014   Cancer (HCC) 2005    PCP: Allwardt, Alyssa PA-C   REFERRING PROVIDER: Shirly Carlin RIGGERS  REFERRING DIAG: Please see note for PT instructions  Post right shoulder arthroscopy, RCR, BT on 02/02/2024  THERAPY DIAG:  Acute pain of right shoulder  Muscle weakness (generalized)  Stiffness of right shoulder, not elsewhere classified  Abnormal posture  Rationale for Evaluation and Treatment: Rehabilitation  ONSET DATE: Surgery 02/02/24   SUBJECTIVE:                                                                                                                                                                                      SUBJECTIVE STATEMENT: Feels ready to wrap up today, knows she needs to work on strength at home   Hand dominance: Right  PERTINENT HISTORY: Right shoulder arthroscopy RTC repair suprapspinatus & subscapularis, biceps tenodesis 02/02/24, Colon CA 2005 with sg, Brachial neuritis, cervical radiculopathy, HTN  PAIN:  Yes: NPRS scale:  0/10 Pain location: right shoulder wrapping around whole shoulder  Pain description: random aches  Aggravating factors:  nothing  Relieving factors: ice or heat, stretching   PRECAUTIONS: Plan is continue with range of motion exercises.  Start physical therapy upstairs to focus on passive and active assisted range of motion of the right shoulder (okay to start active assisted range of motion on 03/01/24).  Okay to start full active range of motion and rotator cuff strengthening exercises on 03/15/2024.  Follow-up in 3 weeks for clinical recheck and determination of return to work potentially.  RED FLAGS: None   WEIGHT BEARING RESTRICTIONS: Yes NWB surgicial shoulder   FALLS:  Has patient fallen in last 6 months? 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  DIAGNOSTIC FINDINGS:   MRI   IMPRESSION: 1. Full-thickness tear of the anterior supraspinatus tendon footprint measuring up to approximately 9 mm in AP dimension and with up to only 4 mm tendon retraction. Mild to moderate supraspinatus muscle atrophy. 2. High-grade partial thickness tearing of the subscapularis tendon diffusely, with likely a full-thickness component superiorly and only minimal intact anterior fibers inserting inferiorly. 3. The long head of the biceps tendon is markedly attenuated just proximal to the bicipital groove, and there appears to be an at least 90% partial-thickness and possible full-thickness tear of the tendon as it courses over the lesser tuberosity into the bicipital groove. 4. Mild degenerative changes of the acromioclavicular joint.    PATIENT SURVEYS:   Patient-Specific Activity Scoring Scheme  0 represents "unable to perform." 10 represents "able to perform at prior level. 0 1 2 3 4 5 6 7 8 9  10 (Date and Score)   Activity Eval  04/21/24  1. Doing hair/make up  1  8  2. Cooking   3 10  3. Getting dressed  4 9  4. Pick up things  0 9  5. Brush teeth  4 10  6.  Write normally  2 9  Score 2.3 9.16   Total score = sum of the activity scores/number of activities Minimum detectable change (90%CI) for average score = 2 points Minimum detectable change (90%CI) for single activity score = 3 points  COGNITION: Overall cognitive status: Within functional limits for tasks assessed     SENSATION: WFL  POSTURE: Rounded shoulders, forward head   UPPER EXTREMITY ROM:   Passive ROM Right eval Right 03/07/24 Right   03/17/2024 Right  03/22/24 Right  04/12/24 Right 04/21/24 Right 05/16/24  Shoulder flexion Initially 40*, able to get to 90* passive with repeated reps P: 98* 95* AAROM: 130 AROM: 130 AROM: 155 A: 126 (standing)  Shoulder extension      AROM: 50   Shoulder scaption  Initially about 30*, able to get to about 75* passive with repeated reps  P: 81*   AROM: 110  AROM: 140 A: 145 (standing)  Shoulder adduction         Shoulder internal rotation Hand to belly at 0* ABD   At 70 degrees abduction 45*   AROM: To T6 A: to T11  Shoulder external rotation Initially -30* at 0* ABD, able to get to about -20* passive with repeated reps  P: 38* at 45* abduction At 70 degrees abduction 35* 65 deg at 0 deg abd AROM: 80 deg at 0 deg abd AROM: 70 deg at 0 deg abd 40 deg at 90 deg abd A: 73 (standing)  (Blank rows = not tested)  UPPER EXTREMITY MMT:  MMT Right 05/16/24 Left 05/16/24  Shoulder flexion 6.1, 6.2 (6.15#) 11.3, 12.0 (11.65#)  Shoulder extension    Shoulder abduction    Shoulder adduction 5.4, 4.6 (5#) 9.5, 8.8 (9.15#)  Shoulder internal rotation    Shoulder external rotation 11.3, 10.0 (10.65#) 15.6, 14.1 (14.85#)  (Blank rows = not tested)  Did not check MMT at eval due to post-op precautions    PALPATION:  Scar initially restricted but got it moving well with a bit of scar massage, encouraged this at home  TREATMENT DATE:  05/25/24 TherEx UBE L5 x 6 in (3 min each direction) Bent over tricep extension 3x10; 2#  TherAct Rows 25# 3x10 Lat pull downs 15# 3x10 Alternating flexion and abduction with 2# holds at 90 deg 3x10 bil Standing horizontal abduction/adduction in 90 deg flex 3x10; 2# bil Standing bil ER with L3 band 3x10  05/16/24 TherEx UBE L5 x 6 in (3 min each direction) Standing Rt shoulder ER L4  2x15 Standing Rt shoulder IR L4 2x15 ROM and MMT -  see above for details  TherAct Shoulder flexion/abduction circuit 2x15 bil; 2# weights Bicep curl with overhead press/tricep extension circuit 2x15 bil; 2# weights L3 band drive the bus x 15 reps Alternating shoulder taps in counter plank x 15 bil    05/04/24 TherEx UBE L5 x 6 in (3 min each direction)  TherAct Rows 20# 3x10 Rt shoulder reactive isometrics ER/IR L3 band 2x10 Wall ladder flexion and scaption with 5 sec hold x 10 reps each direction; 2# wrist weight Standing functional reach to cabinet shelves (all 3 heights) x 10 each in flexion (2#) and scaption (2#) Plank at counter with alternating shoulder taps 2x10 bil Quadruped hip extension x10 reps bil (for shoulder stabilization) Quadruped serratus push up 2x10   05/02/2024 TherEx UBE L4 x 6 in (3 min each direction)  TherAct Isometric reactive right shoulder towel roll under arm to decrease substitutions  green theraband 10 reps ER, flex, IR & ext.  PT added to HEP with HO, demo & verbal cues.  Standing shoulder RUE flexion placing 2# 5 reps & 1# 5 reps on shoulder level, forehead level and overhead level shelf (touching each shelf to reset muscle)  Standing shoulder RUE scapation / abduction placing 2# 5 reps & 1# 5 reps on shoulder level, forehead level and overhead level shelf (touching each shelf to reset muscle)  Standing shoulder BUE flexion placing 5# 5 reps on shoulder level, forehead level and overhead level shelf (touching each shelf to reset muscle). Reset shoulder posture at each shelf.  Pt verbalized understanding of above 4 exercises as updated HEP.     HOME EXERCISE PROGRAM: Access Code: [email or web address] URL: [email or web address] Date: 05/02/2024 Prepared by: Grayce Spatz  Exercises - Doorway Pec Stretch at 60 Degrees Abduction with Arm Straight  - 1 x daily - 7 x weekly - 2 sets - 30 sec hold - Doorway Pec Stretch at 60 Elevation  - 1 x daily - 7 x weekly - 2 sets - 30 sec hold - Standing  Scapular Retraction  - 5 x daily - 7 x weekly - 1 sets - 5 reps - 5 second hold - Shoulder External Rotation in 45 Degrees Abduction  - 1 x daily - 7 x weekly - 2 sets - 10 reps - Standing Overhead Shoulder External Rotation Stretch with Towel  - 1 x daily - 7 x weekly - 2 sets - 10 reps - Pec Minor Stretch  - 1 x daily - 7 x weekly - 2 sets - 10 reps - Shoulder Flexion Wall Slide with Towel  - 1 x daily - 7 x weekly - 3 sets - 10 reps - Scaption Wall Slide with Towel  - 1 x daily - 7 x weekly - 3 sets - 10 reps - Standing Shoulder Abduction Slides at Wall  - 1 x daily - 7 x weekly - 3 sets - 10 reps - Shoulder External Rotation Walkouts with Anchored Resistance  - 1 x daily -  7 x weekly - 2 sets - 10 reps - 5 seconds hold - Shoulder Internal Rotation Reactive Isometrics  - 1 x daily - 7 x weekly - 2 sets - 10 reps - 5 seconds hold - walking balance with band behind you  - 1 x daily - 7 x weekly - 2 sets - 10 reps - 5 seconds hold - Standing Shoulder Row Reactive Isometric  - 1 x daily - 7 x weekly - 2 sets - 10 reps - 5 seconds hold   ASSESSMENT:  CLINICAL IMPRESSION: Pt has met all her goals at this time except her strength goals.  She feels comfortable with a home and gym program to address deficits.  At this time plan to hold PT and pt will call if she needs to return.   OBJECTIVE IMPAIRMENTS: decreased ROM, decreased strength, increased edema, increased fascial restrictions, increased muscle spasms, impaired flexibility, impaired UE functional use, improper body mechanics, postural dysfunction, and pain.   ACTIVITY LIMITATIONS: carrying, lifting, bathing, toileting, dressing, self feeding, reach over head, hygiene/grooming, and caring for others  PARTICIPATION LIMITATIONS: meal prep, cleaning, laundry, driving, shopping, community activity, occupation, and yard work  PERSONAL FACTORS: Time since onset of injury/illness/exacerbation are also affecting patient's functional outcome.    REHAB POTENTIAL: Excellent  CLINICAL DECISION MAKING: Stable/uncomplicated  EVALUATION COMPLEXITY: Low   GOALS: Goals reviewed with patient? No  SHORT TERM GOALS: Target date:  03/25/2024     Patient will be compliant with appropriate progressive HEP        GOAL STATUS: Met 03/17/2024  2. R shoulder flexion and ABD A/PROM to be at least 150*         GOAL STATUS: Ongoing  04/21/2024    3. R shoulder IR and ER A/PROM to be at least 45* at 90* ABD           GOAL STATUS: MET  04/21/2024   4. Will be more aware of posture with all functional tasks with use of ergonomic aides PRN/as desired      GOAL STATUS: MET 04/21/2024     LONG TERM GOALS: Target date: 06/02/2024    MMT in surgical shoulder to be at least 4/5 all tested groups  Baseline:  Goal status: ONGOING 05/25/24  2.  Pain to be no more than 2/10 with all functional task performance  Baseline:  Goal status: MET 04/21/2024 -- no more than a 2  3.  Will be able to perform all personal care and hygiene without increase in pain or difficulty with surgical shoulder  Baseline:  Goal status: MET 04/21/2024 -- able to do her hair and dressing/bathing except donning/doffing bra  4.  Will be able to perform all home and work based tasks and requirements without increase in pain surgical shoulder  Baseline:  Goal status: MET 04/21/2024   5.  PSFS to have improved by at least 4 points  Baseline:  Goal status:  MET  04/21/2024    PLAN:  PT FREQUENCY: 1-2x/wk   PT DURATION: 6 weeks  PLANNED INTERVENTIONS: 97750- Physical Performance Testing, 97110-Therapeutic exercises, 97530- Therapeutic activity, 97112- Neuromuscular re-education, 97535- Self Care, 02859- Manual therapy, [id number]- Aquatic Therapy, H9716- Electrical stimulation (unattended), 97016- Vasopneumatic device, [id number]- Ultrasound, 02966- Ionotophoresis 4mg /ml Dexamethasone , Patient/Family education, Taping, Dry Needling, Cryotherapy, and Moist heat  PLAN FOR NEXT SESSION:  .hold PT, pt pleased with progress  Per MD note 03/16/24: Her strength is in a good spot for where she is at, considering she has  not started any strengthening exercises yet. She should be okay to start rotator cuff strengthening exercises now. Continue with active motion.   NEXT MD VISIT: no follow up scheduled  Corean JULIANNA Ku, PT, DPT 05/25/24 3:17 PM

## 2024-06-01 ENCOUNTER — Encounter: Admitting: Physical Therapy

## 2024-06-06 ENCOUNTER — Encounter: Admitting: Physical Therapy

## 2024-06-13 ENCOUNTER — Encounter: Admitting: Physical Therapy

## 2024-07-06 ENCOUNTER — Other Ambulatory Visit: Payer: Self-pay | Admitting: Physician Assistant

## 2024-07-11 ENCOUNTER — Ambulatory Visit: Payer: Self-pay

## 2024-07-11 NOTE — Telephone Encounter (Signed)
 Pt asking if office has xray on site. KMS states yes but then has notation to send CRM to office.   FYI Only or Action Required?: Action required by provider: See above.  Patient was last seen in primary care on 04/15/2023 by Jodie Lavern CROME, MD.  Called Nurse Triage reporting Triage.  Symptoms began yesterday.  Interventions attempted: Rest, hydration, or home remedies.  Symptoms are: unchanged.  Triage Disposition: See Today in Office - pt refused. Next day appt scheduled. 911/ED precautions reviewed, pt verbalized understanding.  Patient/caregiver understands and will follow disposition?:  Answer Assessment - Initial Assessment Questions Patient reports fall down 8 stairs. Denies head strike or LOC. States she has pain on the upper right side of her back. Reports rib pain when taking a deep breath. Denies SOB at rest. Refused recommendation to be seen same day. 911/ED precautions reviewed, pt verbalized understanding.   1. MECHANISM: How did the fall happen?     Trip/fall down 8 stairs  2. DOMESTIC VIOLENCE AND ELDER ABUSE SCREENING: Did you fall because someone pushed you or tried to hurt you? If Yes, ask: Are you safe now?     Denies  3. ONSET: When did the fall happen? (e.g., minutes, hours, or days ago)     07/10/24  4. LOCATION: What part of the body hit the ground? (e.g., back, buttocks, head, hips, knees, hands, head, stomach)     Landed on back, denies head strike, denies LOC  5. INJURY: Did you hurt (injure) yourself when you fell? If Yes, ask: What did you injure? Tell me more about this? (e.g., body area; type of injury; pain severity)     Right side of lower ribs, right side. Reports unable to take a deep breath.  Protocols used: Falls and [employer] Copied from CRM [id number]. Topic: Clinical - Red Word Triage >> Jul 11, 2024  1:04 PM Zy'onna H wrote: [facility] that prompted transfer to Nurse Triage:  Clemens down the stairs yesterday - Landed flat on  her back and believes ribs are bruised/or broken.   Difficult to breathe in/sneeze/coughing  Pain on the Right side of body  Hematoma on Right Buttocks.   Warm Transfer to NT

## 2024-07-11 NOTE — Telephone Encounter (Signed)
 Please see triage note, pt scheduled to see Hudnell on 07/12/24

## 2024-07-12 ENCOUNTER — Ambulatory Visit (INDEPENDENT_AMBULATORY_CARE_PROVIDER_SITE_OTHER): Admitting: Family

## 2024-07-12 ENCOUNTER — Encounter: Payer: Self-pay | Admitting: Family

## 2024-07-12 ENCOUNTER — Ambulatory Visit (INDEPENDENT_AMBULATORY_CARE_PROVIDER_SITE_OTHER)

## 2024-07-12 VITALS — BP 129/85 | HR 57 | Temp 97.2°F | Ht 68.0 in | Wt 156.4 lb

## 2024-07-12 DIAGNOSIS — Z87898 Personal history of other specified conditions: Secondary | ICD-10-CM | POA: Insufficient documentation

## 2024-07-12 DIAGNOSIS — M7918 Myalgia, other site: Secondary | ICD-10-CM | POA: Diagnosis not present

## 2024-07-12 DIAGNOSIS — R0781 Pleurodynia: Secondary | ICD-10-CM | POA: Diagnosis not present

## 2024-07-12 DIAGNOSIS — L814 Other melanin hyperpigmentation: Secondary | ICD-10-CM | POA: Insufficient documentation

## 2024-07-12 DIAGNOSIS — M8580 Other specified disorders of bone density and structure, unspecified site: Secondary | ICD-10-CM

## 2024-07-12 DIAGNOSIS — L810 Postinflammatory hyperpigmentation: Secondary | ICD-10-CM | POA: Insufficient documentation

## 2024-07-12 DIAGNOSIS — D229 Melanocytic nevi, unspecified: Secondary | ICD-10-CM | POA: Insufficient documentation

## 2024-07-12 DIAGNOSIS — D225 Melanocytic nevi of trunk: Secondary | ICD-10-CM | POA: Insufficient documentation

## 2024-07-12 DIAGNOSIS — D235 Other benign neoplasm of skin of trunk: Secondary | ICD-10-CM | POA: Insufficient documentation

## 2024-07-12 MED ORDER — NAPROXEN 500 MG PO TABS: 500.0000 mg | ORAL_TABLET | Freq: Three times a day (TID) | ORAL | 0 refills | Status: DC

## 2024-07-12 NOTE — Progress Notes (Signed)
 Patient ID: Courtney Watson, female    DOB: 1960-12-21, 63 y.o.   MRN: [id number]  Chief Complaint  Patient presents with   Fall    Pt c/o fall down stairs on Sunday. Pt c/o rib pain and back pain, Has tried advil and tylenol  which does slightly help sx.   Discussed the use of AI scribe software for clinical note transcription with the patient, who gave verbal consent to proceed.  History of Present Illness   Courtney Watson is a 63 year old female with osteopenia who presents with rib pain following a fall.  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 and tripping on pajama bottoms, landing on her buttocks and her back - Severe right sided rib pain, especially with deep inspiration - No visible bruising over the ribs, large bruise on right buttock - Breathing is painful with deep breaths  - No significant hip pain or limping, though the area is tender - No chest pain, shortness of breath, or palpitations  Musculoskeletal injury history - History of osteopenia, raising concern for possible rib fx - History of previous fall without serious injury - Recently recovered from right rotator cuff surgery  Pain management and supportive measures - Uses Tylenol  and Advil for pain control - Muscle relaxers and hydrocodone  available but not used for this injury - Has not applied ice to the injured area  Bone health management - Takes vitamin D  supplements daily - Cautious with calcium intake      Assessment & Plan:     Rib and buttock pain after fall Acute rib and chest wall pain post-fall with significant pain on deep breaths. Differential includes rib fracture or cartilage injury. X-ray offered due to osteopenia. Large ecchymosis on right buttock, resolving, not pain on right hip, sacrum, or lumbar with palpation. No limping. - Order chest x-ray to assess for rib fractures. - Prescribe naproxen  500mg  tid prn  for pain. - Recommend alternating ice and heat application for 20-30 minutes several times per day. - Advise use of generic Robaxin  1000 mg if not driving, caution drowsiness. - Ok to take oxycodone  5mg  prn at night after eating. - Call back if pain is not improving over the next 1-2 weeks.  Osteopenia Osteopenia increases fracture risk. Discussed calcium intake and constipation risk. - Continue daily vitamin D3, 4000-5000 units. - Ensure adequate calcium intake, up to 600 mg per day.     Subjective:    Outpatient Medications Prior to Visit  Medication Sig Dispense Refill   ALPRAZolam  (XANAX ) 0.5 MG tablet Take 1 tablet (0.5 mg total) by mouth 3 (three) times daily as needed. 60 tablet 0   Biotin 10 MG CAPS Take 10 mg by mouth daily.     Calcium Carbonate (CALCIUM 500 PO) Take 500 mg by mouth 2 (two) times daily.     Cholecalciferol (VITAMIN D3) 125 MCG (5000 UT) CAPS Take 5,000 Units by mouth daily.     cyanocobalamin  (VITAMIN B12) 1000 MCG/ML injection INJECT 1 ML INTRAMUSCULARLY EVERY 30 DAYS AS DIRECTED 1 mL 2   losartan -hydrochlorothiazide (HYZAAR) 100-12.5 MG tablet TAKE 1 TABLET BY MOUTH DAILY 90 tablet 1   Multiple Vitamins-Minerals (HAIR/SKIN/NAILS/BIOTIN PO) Take 1 tablet by mouth daily.     sertraline  (ZOLOFT ) 100 MG tablet Take 1 tablet (100 mg total) by mouth daily. 90 tablet 3   SYRINGE-NEEDLE, DISP, 3 ML 23G X 1 3 ML MISC 1  Device by Does not apply route every 30 (thirty) days. 50 each 1   methocarbamol  (ROBAXIN ) 500 MG tablet Take 1 tablet (500 mg total) by mouth every 8 (eight) hours as needed for muscle spasms. (Patient not taking: Reported on 07/12/2024) 30 tablet 1   oxyCODONE  (ROXICODONE ) 5 MG immediate release tablet Take 1 tablet (5 mg total) by mouth every 4 (four) hours as needed for severe pain (pain score 7-10). Do not take with Xanax  (Patient not taking: Reported on 07/12/2024) 30 tablet 0   clobetasol (TEMOVATE) 0.05 % external solution Apply 1 Application  topically 2 (two) times daily. (Patient not taking: Reported on 07/12/2024)     diclofenac  Sodium (VOLTAREN  ARTHRITIS PAIN) 1 % GEL Apply 2 g topically 4 (four) times daily. (Patient not taking: Reported on 07/12/2024) 100 g 0   dicyclomine  (BENTYL ) 20 MG tablet Take 1 tablet (20 mg total) by mouth 2 (two) times daily. (Patient not taking: Reported on 07/12/2024) 20 tablet 0   lactobacillus acidophilus (BACID) TABS tablet Take 2 tablets by mouth 3 (three) times daily. (Patient not taking: Reported on 07/12/2024)     meloxicam  (MOBIC ) 15 MG tablet TAKE 1 TABLET (15 MG TOTAL) BY MOUTH DAILY. (Patient not taking: Reported on 07/12/2024) 30 tablet 0   No facility-administered medications prior to visit.   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   Malignant tumor of colon (HCC) 04/01/2018   Melanocytic nevus of trunk 07/12/2024   Neck pain 09/12/2014   Osteopenia 2018   hips and spine   Other benign neoplasm of skin of trunk 07/12/2024   Palpitations 04/01/2018   Perimenopausal disorder 04/01/2018   Post-inflammatory hyperpigmentation 07/12/2024   Seasonal allergies    Shortness of breath on exertion 06/08/2017   Ulcerative colitis (HCC)    Uterine leiomyoma 04/01/2018   Vitamin D  deficiency    Past Surgical History:  Procedure Laterality Date   BICEPT TENODESIS Right 02/02/2024    Procedure: TENODESIS, BICEPS;  Surgeon: Addie Cordella Hamilton, MD;  Location: [facility] OR;  Service: Orthopedics;  Laterality: Right;   COLON SURGERY  2005   colon cancer--Dr.Rosenbower   ENDOMETRIAL ABLATION  2012   Dr.Lomax   KNEE ARTHROSCOPY Right    MYOMECTOMY  2012   Dr.Lomax   POSTERIOR LUMBAR FUSION 2 WITH HARDWARE REMOVAL Right 02/02/2024   Procedure: ARTHROSCOPY, SHOULDER WITH DEBRIDEMENT;  Surgeon: Addie Cordella Hamilton, MD;  Location: [facility] OR;  Service: Orthopedics;  Laterality: Right;  PROCEDURE:  RIGHT SHOULDER ARTHROSCOPY, DEBRIDEMENT, MINI OPEN BICEPS TENODESIS & ROTATOR CUFF TEAR REPAIR -SUPRASPINATUS AND SUBSCAPULARIS   SHOULDER OPEN ROTATOR CUFF REPAIR Right 02/02/2024   Procedure: REPAIR, ROTATOR CUFF, OPEN;  Surgeon: Addie Cordella Hamilton, MD;  Location: [facility] OR;  Service: Orthopedics;  Laterality: Right;   No Known Allergies    Objective:    Physical Exam Vitals and nursing note reviewed.  Constitutional:      Appearance: Normal appearance.  Cardiovascular:     Rate and Rhythm: Normal rate and regular rhythm.  Pulmonary:     Effort: Pulmonary effort is normal.     Breath sounds: Normal breath sounds.  Musculoskeletal:     Thoracic back: Tenderness and bony tenderness present. Decreased range of motion.     Lumbar back: No tenderness or bony tenderness. Decreased range of motion.       Back:     Right hip: No tenderness or bony tenderness. Normal range of motion.  Skin:    General: Skin is warm and dry.     Findings: Ecchymosis (large, resolving, purplish-blue on right buttock) present.  Neurological:     Mental Status: She is alert.  Psychiatric:        Mood and Affect: Mood normal.        Behavior: Behavior normal.    BP 129/85 (BP Location: Left Arm, Patient Position: Sitting, Cuff Size: Large)   Pulse (!) 57   Temp (!) 97.2 F (36.2 C)   Ht 5' 8 (1.727 m)   Wt 156 lb 6.4 oz (70.9 kg)   SpO2 98%   BMI 23.78 kg/m  Wt Readings from Last 3 Encounters:   07/12/24 156 lb 6.4 oz (70.9 kg)  02/02/24 156 lb (70.8 kg)  01/27/24 160 lb 8 oz (72.8 kg)      Lucius Krabbe, NP

## 2024-07-19 ENCOUNTER — Telehealth: Payer: Self-pay | Admitting: Radiology

## 2024-07-19 ENCOUNTER — Ambulatory Visit: Payer: Self-pay | Admitting: Family

## 2024-07-19 NOTE — Telephone Encounter (Signed)
 TY

## 2024-07-19 NOTE — Telephone Encounter (Signed)
 The Ribs X-ray is scheduled for review today.

## 2024-07-19 NOTE — Telephone Encounter (Signed)
 A user error has taken place: encounter opened in error, closed for administrative reasons.

## 2024-08-05 LAB — HM MAMMOGRAPHY

## 2024-08-10 ENCOUNTER — Encounter: Payer: Self-pay | Admitting: Physician Assistant

## 2024-08-11 ENCOUNTER — Ambulatory Visit: Payer: 59 | Admitting: Psychiatry

## 2024-08-11 DIAGNOSIS — F40233 Fear of injury: Secondary | ICD-10-CM

## 2024-08-11 DIAGNOSIS — Z566 Other physical and mental strain related to work: Secondary | ICD-10-CM | POA: Diagnosis not present

## 2024-08-11 DIAGNOSIS — F410 Panic disorder [episodic paroxysmal anxiety] without agoraphobia: Secondary | ICD-10-CM

## 2024-08-11 NOTE — Progress Notes (Unsigned)
 Courtney Watson [id number] 04-23-1961 63 y.o.  Subjective:   Patient ID:  Courtney Watson is a 63 y.o. (DOB [birth year]) female.  Chief Complaint:  No chief complaint on file.   HPI Courtney Watson presents to the office today for follow-up of anxiety.  08/08/20 appt without med changes: Cont sertraline  100 and about 10 Xanax /month.  08/08/21 appt noted: Pretty good.  Around mid May at [facility] had panic attack.  Taking Xanax  again when going out after that.  Disappointed at having to take it again.  Anxiety is a little worse in the car again.  Good when she's driving.  Fiddles with fingers when H drives.   No SE. Plan: Continue sertraline  100 and prn Xanax . High relapse risk without it.  08/11/22 appt noted: Fantastic.  Anxiety manageable.  3 panic since here situalionally in a restaurant. Using Xanax  prn when goes out to restaurant if needed like it's busy. Consistent with sertraline . No SE with either. Plan no changes  08/12/23 appt noted:  Psych med: sertraline  100 and prn Xanax . Anxiety good and under control.  Xanax  much less than in the past.  Will need it if close restaurants crowded.  Does ok without flying now.  Doing fine in the car for long periods.  H notices benefit too.  Rare panic and never spontaneous.   No SE Not dep.   Normal function.    08/11/24 appt noted: Med:  sertraline  100 and prn Xanax . Been fine with anixety.  Except about to lose it with job stress. 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  Has no help. Supervisor quit.  Overworked.  Gets screamed at 15 times per day.  This started July 31.  Sx scaring my mom.  Lost 11 #.  Looking for another job.     Xanax  works if needed. No spontaneous panic attacks lately.  Can trigger herself to feel throat close up if talks.  Review of Systems:  Review of Systems  Cardiovascular:  Negative for chest pain and palpitations.  Neurological:  Negative for tremors and weakness.    Medications:  I have reviewed the patient's current medications.  Current Outpatient Medications  Medication Sig Dispense Refill  . ALPRAZolam  (XANAX ) 0.5 MG tablet Take 1 tablet (0.5 mg total) by mouth 3 (three) times daily as needed. 60 tablet 0  . Biotin 10 MG CAPS Take 10 mg by mouth daily.    . Calcium Carbonate (CALCIUM 500 PO) Take 500 mg by mouth 2 (two) times daily.    . Cholecalciferol (VITAMIN D3) 125 MCG (5000 UT) CAPS Take 5,000 Units by mouth daily.    . cyanocobalamin  (VITAMIN B12) 1000 MCG/ML injection INJECT 1 ML INTRAMUSCULARLY EVERY 30 DAYS AS DIRECTED 1 mL 2  . losartan -hydrochlorothiazide (HYZAAR) 100-12.5 MG tablet TAKE 1 TABLET BY MOUTH DAILY 90 tablet 1  . methocarbamol  (ROBAXIN ) 500 MG tablet Take 1 tablet (500 mg total) by mouth every 8 (eight) hours as needed for muscle spasms. (Patient not taking: Reported on 07/12/2024) 30 tablet 1  . Multiple Vitamins-Minerals (HAIR/SKIN/NAILS/BIOTIN PO) Take 1 tablet by mouth daily.    . naproxen  (NAPROSYN ) 500 MG tablet Take 1 tablet (500 mg total) by mouth 3 (three) times daily with meals. For pain. 30 tablet 0  . oxyCODONE  (ROXICODONE ) 5 MG immediate release tablet Take 1 tablet (5 mg total) by mouth every 4 (four) hours as needed for severe pain (pain score 7-10). Do not take with Xanax  (Patient not taking:  Reported on 07/12/2024) 30 tablet 0  . sertraline  (ZOLOFT ) 100 MG tablet Take 1 tablet (100 mg total) by mouth daily. 90 tablet 3  . SYRINGE-NEEDLE, DISP, 3 ML 23G X 1 3 ML MISC 1 Device by Does not apply route every 30 (thirty) days. 50 each 1   No current facility-administered medications for this visit.    Medication Side Effects: None  Allergies: No Known Allergies  Past Medical History:  Diagnosis Date  . Abnormal uterine bleeding    when she had fibroid tumor  . Anxiety   . Blood in stool   . 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   Overview:  Clinically Left C4 Clinically Left C4  . Cancer [facility]) 2005   colon   .  Cervical radiculopathy 01/10/2015  . Colon polyps   . Eating disorder   . Fatigue 01/25/2018  . Fibroid   . H/O rhinoplasty   . History of chicken pox   . History of neoplasm 07/12/2024  . Hypertension   . Irregular heart beat 04/01/2018  . Knee pain, right 08/22/2015  . Left thyroid  nodule    needs follow up ultrasound in 2021  . Lentigo 07/12/2024  . 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 Malignant tumor of colon (HCC) 04/01/2018  . Melanocytic nevus of trunk 07/12/2024  . Neck pain 09/12/2014  . Osteopenia 2018   hips and spine  . Other benign neoplasm of skin of trunk 07/12/2024  . Palpitations 04/01/2018  . Perimenopausal disorder 04/01/2018  . Post-inflammatory hyperpigmentation 07/12/2024  . Seasonal allergies   . Shortness of breath on exertion 06/08/2017  . Ulcerative colitis (HCC)   . Uterine leiomyoma 04/01/2018  . Vitamin D  deficiency     Family History  Problem Relation Age of Onset  . Osteoporosis Mother   . Hypertension Mother   . Arthritis Mother   . Hearing loss Mother   . Cancer Father        prostate  . Parkinson's disease Father        Dec age 65 from parkinsons/Lewey Body Dementia?  SABRA Hypertension Father   . Hyperlipidemia Father   . Osteoarthritis Father   . Rheum arthritis Father   . Cancer Maternal Uncle 45       colon cancer  . Depression Maternal Grandmother   . Diabetes Maternal Grandfather   . Heart disease Maternal Grandfather   . Hypertension Maternal Grandfather   . Breast cancer Paternal Grandmother   . Cancer Paternal Grandfather     Social History   Socioeconomic History  . Marital status: Married    Spouse name: Not on file  . Number of children: Not on file  . Years of education: Not on file  . Highest education level: Associate degree: occupational, Scientist, product/process development, or vocational program  Occupational History  . Not on file  Tobacco Use  . Smoking status: Former    Current packs/day: 0.00    Types:  Cigarettes    Quit date: 11/18/2011    Years since quitting: 12.7    Passive exposure: Never  . Smokeless tobacco: Never  Vaping Use  . Vaping status: Every Day  . Start date: 11/18/2011  Substance and Sexual Activity  . Alcohol use: Yes    Alcohol/week: 4.0 standard drinks of alcohol    Types: 4 Cans of beer per week    Comment: on the weekends  . Drug use: No  . Sexual activity: Yes    Partners: Male  Birth control/protection: Post-menopausal    Comment: Ablation 2012  Other Topics Concern  . Not on file  Social History Narrative  . Not on file   Social Drivers of Health   Financial Resource Strain: Low Risk  (01/08/2024)   Overall Financial Resource Strain (CARDIA)   . Difficulty of Paying Living Expenses: Not hard at all  Food Insecurity: No Food Insecurity (01/08/2024)   Hunger Vital Sign   . Worried About Programme researcher, broadcasting/film/video in the Last Year: Never true   . Ran Out of Food in the Last Year: Never true  Transportation Needs: No Transportation Needs (01/08/2024)   PRAPARE - Transportation   . Lack of Transportation (Medical): No   . Lack of Transportation (Non-Medical): No  Physical Activity: Sufficiently Active (01/08/2024)   Exercise Vital Sign   . Days of Exercise per Week: 2 days   . Minutes of Exercise per Session: 90 min  Stress: No Stress Concern Present (01/08/2024)   [employer] of Occupational Health - Occupational Stress Questionnaire   . Feeling of Stress : Not at all  Social Connections: Unknown (01/08/2024)   Social Connection and Isolation Panel   . Frequency of Communication with Friends and Family: More than three times a week   . Frequency of Social Gatherings with Friends and Family: Once a week   . Attends Religious Services: Patient declined   . Active Member of Clubs or Organizations: No   . Attends Banker Meetings: Not on file   . Marital Status: Married  Catering manager Violence: Not on file    Past Medical History,  Surgical history, Social history, and Family history were reviewed and updated as appropriate.   Please see review of systems for further details on the patient's review from today.   Objective:   Physical Exam:  There were no vitals taken for this visit.  Physical Exam Constitutional:      General: She is not in acute distress.    Appearance: She is well-developed.  Neurological:     Mental Status: She is alert and oriented to person, place, and time.     Cranial Nerves: No dysarthria.  Psychiatric:        Attention and Perception: She is attentive. She does not perceive auditory or visual hallucinations.        Mood and Affect: Mood is anxious. Mood is not depressed. Affect is not labile, blunt, angry or inappropriate.        Speech: Speech normal.        Behavior: Behavior normal.        Thought Content: Thought content normal. Thought content is not delusional. Thought content does not include homicidal or suicidal ideation. Thought content does not include suicidal plan.        Cognition and Memory: Cognition normal.        Judgment: Judgment normal.     Comments: Insight intact. No auditory or visual hallucinations. No delusions.  She still has some anxiety around eating and requires the Xanax  prn eatin in restaurant but it is manageable and much less severe than in the past.  Some anxiety in car also but better     Lab Review:     Component Value Date/Time   NA 140 01/27/2024 0955   NA 142 02/19/2023 1606   K 4.4 01/27/2024 0955   CL 104 01/27/2024 0955   CO2 26 01/27/2024 0955   GLUCOSE 87 01/27/2024 0955   BUN 11 01/27/2024  0955   BUN 16 02/19/2023 1606   CREATININE 0.57 01/27/2024 0955   CREATININE 0.59 03/18/2017 1506   CALCIUM 9.9 01/27/2024 0955   PROT 7.5 11/17/2023 1033   PROT 7.1 07/20/2019 1624   ALBUMIN 4.6 11/17/2023 1033   ALBUMIN 4.7 07/20/2019 1624   AST 26 11/17/2023 1033   ALT 20 11/17/2023 1033   ALKPHOS 86 11/17/2023 1033   BILITOT 0.4  11/17/2023 1033   BILITOT 0.2 07/20/2019 1624   GFRNONAA >60 01/27/2024 0955   GFRAA 116 11/29/2020 1124       Component Value Date/Time   WBC 6.2 01/27/2024 0955   RBC 4.34 01/27/2024 0955   HGB 13.4 01/27/2024 0955   HGB 12.5 07/20/2019 1624   HCT 41.0 01/27/2024 0955   HCT 38.8 07/20/2019 1624   PLT 241 01/27/2024 0955   PLT 250 07/20/2019 1624   MCV 94.5 01/27/2024 0955   MCV 93 07/20/2019 1624   MCH 30.9 01/27/2024 0955   MCHC 32.7 01/27/2024 0955   RDW 12.7 01/27/2024 0955   RDW 12.4 07/20/2019 1624   LYMPHSABS 1.2 11/17/2023 1033   MONOABS 0.7 11/17/2023 1033   EOSABS 0.2 11/17/2023 1033   BASOSABS 0.0 11/17/2023 1033    No results found for: POCLITH, LITHIUM   No results found for: PHENYTOIN, PHENOBARB, VALPROATE, CBMZ   .res Assessment: Plan:    No diagnosis found.   Courtney Watson has a history of panic disorder and a fear of choking.  It has been extreme and caused a great deal of avoidance in the past she would avoid eating out.  Now she can eat out although she does have to take half of 0.5 mg Xanax  in order to do so.  She gets overall antipanic effects from the sertraline . Continue sertraline  100 and prn Xanax  0.5 mg.  High relapse risk without meds.  She still has some anxiety around eating and requires the Xanax  prn eatin in restaurant but it is manageable and much less severe than in the past.  Some anxiety in car also  No med changes.   overall doing well.   She still has some anxiety around eating and requires the Xanax  prn eatin in restaurant but it is manageable and much less severe than in the past.  Some anxiety in car also  We discussed the short-term risks associated with benzodiazepines including sedation and increased fall risk among others.  Discussed long-term side effect risk including dependence, potential withdrawal symptoms, and the potential eventual dose-related risk of dementia.  But recent studies from 2020 dispute this  association between benzodiazepines and dementia risk. Newer studies in 2020 do not support an association with dementia.  Follow-up 1 year  Courtney Macintosh, MD, DFAPA   Please see After Visit Summary for patient specific instructions.  Future Appointments  Date Time Provider Department Center  11/18/2024  9:30 AM Allwardt, Mardy HERO, PA-C [facility]    No orders of the defined types were placed in this encounter.     -------------------------------

## 2024-08-12 ENCOUNTER — Encounter: Payer: Self-pay | Admitting: Psychiatry

## 2024-09-09 ENCOUNTER — Other Ambulatory Visit: Payer: Self-pay | Admitting: Psychiatry

## 2024-09-09 DIAGNOSIS — F410 Panic disorder [episodic paroxysmal anxiety] without agoraphobia: Secondary | ICD-10-CM

## 2024-09-19 ENCOUNTER — Encounter: Payer: Self-pay | Admitting: Radiology

## 2024-10-07 ENCOUNTER — Other Ambulatory Visit: Payer: Self-pay | Admitting: Physician Assistant

## 2024-10-26 ENCOUNTER — Ambulatory Visit: Admitting: Psychiatry

## 2024-11-18 ENCOUNTER — Encounter: Payer: 59 | Admitting: Physician Assistant

## 2024-11-22 DIAGNOSIS — D225 Melanocytic nevi of trunk: Secondary | ICD-10-CM | POA: Insufficient documentation

## 2024-11-22 NOTE — Progress Notes (Unsigned)
 " Cardiology Office Note:    Date:  11/23/2024   ID:  Courtney Watson, DOB 01-13-1961, MRN [id number]  PCP:  Kathrene Mardy HERO, PA-C  Cardiologist:  Redell Leiter, MD    Referring MD: Kathrene Mardy HERO, PA-C    ASSESSMENT:    1. PVC's (premature ventricular contractions)   2. APC (atrial premature contractions)   3. PAT (paroxysmal atrial tachycardia)   4. Primary hypertension    PLAN:    In order of problems listed above:  She still has frequent PVCs by EKG asymptomatic I am going to repeat an event monitor to see the frequency at this point in time would not advocate for EP PVC ablation. I did ask her to begin over-the-counter magnesium which can be helpful Fortunately her atrial arrhythmia has receded not having any symptomatic episodes of atrial tachycardia Well controlled continue ARB thiazide diuretic   Next appointment: 1 year   Medication Adjustments/Labs and Tests Ordered: Current medicines are reviewed at length with the patient today.  Concerns regarding medicines are outlined above.  Orders Placed This Encounter  Procedures   LONG TERM MONITOR (3-14 DAYS)   EKG 12-Lead   No orders of the defined types were placed in this encounter.    History of Present Illness:    Courtney Watson is a 64 y.o. female with a hx of palpitation with frequent symptomatic PACs and brief runs of atrial tachycardia right bundle branch block and hypertension last seen 02/17/2023.  At that time she had prominent complaint chest pain cardiac CTA showed diffuse hepatic steatosis coronary calcium score was 0 with no evidence of CAD.  Compliance with diet, lifestyle and medications: Yes  She avoids over-the-counter proarrhythmic drugs She has minimal symptoms of palpitation isolated in the evenings and has a smart watch without any warnings of bradycardia or tacky arrhythmia No edema shortness of breath chest pain syncope Past Medical History:  Diagnosis Date   Abnormal uterine  bleeding    when she had fibroid tumor   Anxiety    Arthritis pain of hand 11/07/2022   Biceps tendonitis on right 02/07/2024   Blood in stool    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   Overview:  Clinically Left C4 Clinically Left C4   Cancer (HCC) 2005   Cervical radiculopathy 01/10/2015   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   Colon polyps    Degenerative superior labral anterior-to-posterior (SLAP) tear of right shoulder 02/07/2024   Eating disorder    Fatigue 01/25/2018   Fibroid    H/O rhinoplasty    History of chicken pox    History of neoplasm 07/12/2024   Hypertension    Irregular heart beat 04/01/2018   Knee pain, right 08/22/2015   Left thyroid  nodule    needs follow up ultrasound in 2021   Lentigo 07/12/2024   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   Malignant tumor of colon (HCC) 04/01/2018   Melanocytic nevus of trunk 07/12/2024   Neck pain 09/12/2014   Nevus of axillary region 11/22/2024   Osteopenia 2018   hips and spine   Other benign neoplasm of skin of trunk 07/12/2024   Palpitations 04/01/2018   Perimenopausal disorder 04/01/2018   Post-inflammatory hyperpigmentation 07/12/2024   Seasonal allergies    Shortness of breath on exertion 06/08/2017   Synovitis of right shoulder 02/07/2024   Ulcerative colitis (HCC)    Uterine leiomyoma 04/01/2018   Vitamin D  deficiency     Current Medications: Active Medications[1]  EKGs/Labs/Other Studies Reviewed:    The following studies were reviewed today:  Cardiac Studies & Procedures   ______________________________________________________________________________________________     ECHOCARDIOGRAM  ECHOCARDIOGRAM COMPLETE 04/22/2018  Narrative *[facility]* [address] [place], [place] [address] [phone]  ------------------------------------------------------------------- Transthoracic Echocardiography  Patient:    Courtney, Watson MR #:        [id number] Study Date: 04/22/2018 Gender:     F Age:        56 Height:     172.7 cm Weight:     73.5 kg BSA:        1.89 m^2 Pt. Status: Room:  ATTENDING    Redell Leiter, MD ORDERING     Redell Leiter, MD REFERRING    Redell Leiter, MD PERFORMING   [facility] SONOGRAPHER  Jimmy Reel, RDCS  cc:  ------------------------------------------------------------------- LV EF: 50% -   55%  ------------------------------------------------------------------- Indications:      Palpitations 785.1.  Shortness of breath 786.05.  ------------------------------------------------------------------- History:   PMH:  No prior cardiac history.  ------------------------------------------------------------------- Study Conclusions  - Left ventricle: The cavity size was normal. Wall thickness was normal. Systolic function was normal. The estimated ejection fraction was in the range of 50% to 55%. Wall motion was normal; there were no regional wall motion abnormalities. Doppler parameters are consistent with abnormal left ventricular relaxation (grade 1 diastolic dysfunction). - Left atrium: The atrium was mildly dilated.  ------------------------------------------------------------------- Study data:  No prior study was available for comparison.  Study status:  Routine.  Procedure:  The patient reported no pain pre or post test. Transthoracic echocardiography. Image quality was adequate.  Study completion:  There were no complications. Transthoracic echocardiography.  M-mode, complete 2D, spectral Doppler, and color Doppler.  Birthdate:  Patient birthdate: November 18, 1960.  Age:  Patient is 64 yr old.  Sex:  Gender: female. BMI: 24.6 kg/m^2.  Blood pressure:     152/86  Patient status: Outpatient.  Study date:  Study date: 04/22/2018. Study time: 08:18 AM.  Location:  Echo  laboratory.  -------------------------------------------------------------------  ------------------------------------------------------------------- Left ventricle:  The cavity size was normal. Wall thickness was normal. Systolic function was normal. The estimated ejection fraction was in the range of 50% to 55%. Wall motion was normal; there were no regional wall motion abnormalities. Doppler parameters are consistent with abnormal left ventricular relaxation (grade 1 diastolic dysfunction). There was no evidence of elevated ventricular filling pressure by Doppler parameters.  ------------------------------------------------------------------- Aortic valve:   Structurally normal valve. Trileaflet; normal thickness leaflets. Cusp separation was normal. Mobility was not restricted.  Doppler:  Transvalvular velocity was within the normal range. There was no stenosis. There was no regurgitation.  ------------------------------------------------------------------- Aorta:  Aortic root: The aortic root was normal in size.  ------------------------------------------------------------------- Mitral valve:   Mildly thickened leaflets . Mobility was not restricted.  Doppler:  Transvalvular velocity was within the normal range. There was no evidence for stenosis. There was trivial regurgitation.    Peak gradient (D): 3 mm Hg.  ------------------------------------------------------------------- Left atrium:  The atrium was mildly dilated.  ------------------------------------------------------------------- Atrial septum:  The septum was normal.  ------------------------------------------------------------------- Pulmonary veins: Common pulmonary vein:  The Doppler velocity and flow profile were normal.  ------------------------------------------------------------------- Right ventricle:  The cavity size was normal. Wall thickness was normal. Systolic function was  normal.  ------------------------------------------------------------------- Pulmonic valve:    Doppler:  Transvalvular velocity was within the normal range. There was no evidence for stenosis.  ------------------------------------------------------------------- Tricuspid valve:   Structurally normal valve.  Doppler: Transvalvular velocity was within the normal range. There was trivial regurgitation.  ------------------------------------------------------------------- Pulmonary artery:   The main pulmonary artery was normal-sized. Systolic pressure was within the normal range.  ------------------------------------------------------------------- Right atrium:  The atrium was normal in size.  ------------------------------------------------------------------- Pericardium:  The pericardium was normal in appearance. There was no pericardial effusion.  ------------------------------------------------------------------- Systemic veins: Inferior vena cava: The vessel was normal in size. The respirophasic diameter changes were in the normal range (>= 50%), consistent with normal central venous pressure.  ------------------------------------------------------------------- Measurements  Left ventricle                        Value        Reference LV ID, ED, PLAX chordal               46.9  mm     43 - 52 LV ID, ES, PLAX chordal               33.7  mm     23 - 38 LV fx shortening, PLAX chordal (L)    28    %      >=29 LV PW thickness, ED                   9.15  mm     ---------- IVS/LV PW ratio, ED                   1            <=1.3 Stroke volume, 2D                     59    ml     ---------- Stroke volume/bsa, 2D                 31    ml/m^2 ---------- LV e&', lateral                        9.25  cm/s   ---------- LV E/e&', lateral                      8.7          ---------- LV e&', medial                         7.29  cm/s   ---------- LV E/e&', medial                        11.04        ---------- LV e&', average                        8.27  cm/s   ---------- LV E/e&', average                      9.73         ---------- Longitudinal strain, TDI              18    %      ----------  Ventricular septum                    Value        Reference IVS thickness, ED  9.12  mm     ----------  LVOT                                  Value        Reference LVOT ID, S                            19    mm     ---------- LVOT area                             2.84  cm^2   ---------- LVOT peak velocity, S                 89.4  cm/s   ---------- LVOT mean velocity, S                 63    cm/s   ---------- LVOT VTI, S                           20.8  cm     ----------  Aorta                                 Value        Reference Aortic root ID, ED                    33    mm     ----------  Left atrium                           Value        Reference LA ID, A-P, ES                        39    mm     ---------- LA ID/bsa, A-P                        2.07  cm/m^2 <=2.2 LA volume, S                          76    ml     ---------- LA volume/bsa, S                      40.3  ml/m^2 ---------- LA volume, ES, 1-p A4C                64.3  ml     ---------- LA volume/bsa, ES, 1-p A4C            34.1  ml/m^2 ---------- LA volume, ES, 1-p A2C                85.9  ml     ---------- LA volume/bsa, ES, 1-p A2C            45.6  ml/m^2 ----------  Mitral valve                          Value        Reference Mitral  E-wave peak velocity           80.5  cm/s   ---------- Mitral A-wave peak velocity           93.1  cm/s   ---------- Mitral deceleration time              225   ms     150 - 230 Mitral peak gradient, D               3     mm Hg  ---------- Mitral E/A ratio, peak                0.9          ----------  Pulmonary arteries                    Value        Reference PA pressure, S, DP                    26    mm Hg  <=30  Tricuspid valve                        Value        Reference Tricuspid regurg peak velocity        238   cm/s   ---------- Tricuspid peak RV-RA gradient         23    mm Hg  ----------  Right atrium                          Value        Reference RA ID, S-I, ES, A4C                   48.4  mm     34 - 49 RA area, ES, A4C                      14.7  cm^2   8.3 - 19.5 RA volume, ES, A/L                    35.9  ml     ---------- RA volume/bsa, ES, A/L                19    ml/m^2 ----------  Systemic veins                        Value        Reference Estimated CVP                         3     mm Hg  ----------  Right ventricle                       Value        Reference RV pressure, S, DP                    26    mm Hg  <=30 RV s&', lateral, S                     12.7  cm/s   ----------  Legend: (L)  and  (H)  mark values outside specified reference range.  ------------------------------------------------------------------- Prepared and  Electronically Authenticated by  Redell Leiter, MD 2019-06-06T12:02:41    MONITORS  LONG TERM MONITOR (3-14 DAYS) 12/04/2021  Narrative Patch Wear Time:  7 days and 2 hours (2023-01-06T15:56:42-0500 to 2023-01-13T18:55:39-0500)  Patient had a min HR of 60 bpm, max HR of 176 bpm, and avg HR of 88 bpm. Predominant underlying rhythm was Sinus Rhythm. Bundle Branch Block/IVCD was present. 20 Supraventricular Tachycardia runs occurred, the run with the fastest interval lasting 4 beats with a max rate of 176 bpm, the longest lasting 19 beats with an avg rate of 147 bpm. Isolated SVEs were occasional (3.7%, 33140), SVE Couplets were rare (<1.0%, 171), and SVE Triplets were rare (<1.0%, 67). Isolated VEs were occasional (4.6%, 41862), VE Couplets were occasional (2.8%, 12800), and VE Triplets were rare (<1.0%, 577). Ventricular Bigeminy and Trigeminy were present.  The diary and triggered events are predominantly frequent PVCs couplets bigeminy but also at times with occasional atrial  premature contractions.  Frequent ventricular ectopy is seen with a burden of 7.4%, couplets were 2.8% and her 577 triplets noted single morphology. Supraventricular ectopy is occasional 3.7% with 20 runs of atrial premature contractions longest 19 complexes rate 147, paroxysmal atrial tachycardia.  There are no episodes of atrial fibrillation or flutter.   CT SCANS  CT CORONARY MORPH W/CTA COR W/SCORE 02/26/2023  Addendum 02/28/2023  6:46 PM ADDENDUM REPORT: 02/28/2023 18:44  EXAM: OVER-READ INTERPRETATION  CT CHEST  The following report is an over-read performed by radiologist Dr. Elspeth Dada [facility] on 02/28/2023. This over-read does not include interpretation of cardiac or coronary anatomy or pathology. The cardiovascular interpretation by the cardiologist is attached.  COMPARISON:  Chest radiographs dated 01/05/2019 11/06/2008.  FINDINGS: Minimal bilateral dependent atelectasis. No lung nodules or pleural fluid seen. Thoracic spine degenerative changes. Diffuse low density of the liver.  IMPRESSION: 1. No lung nodules or acute abnormality. 2. Diffuse hepatic steatosis.   Electronically Signed By: Elspeth Bathe M.D. On: 02/28/2023 18:44  Narrative CLINICAL DATA:  This is 33 female with anginal symptoms.  EXAM: Cardiac/Coronary  CTA  TECHNIQUE: The patient was scanned on a [employer].  FINDINGS: A 100 kV prospective scan was triggered in the descending thoracic aorta at 111 HU's. Axial non-contrast 3 mm slices were carried out through the heart. The data set was analyzed on a dedicated work station and scored using the Agatson method. Gantry rotation speed was 250 msecs and collimation was .6 mm. No beta blockade and 0.8 mg of sl NTG was given. The 3D data set was reconstructed in 5% intervals of the 67-82 % of the R-R cycle. Diastolic phases were analyzed on a dedicated work station using MPR, MIP and VRT modes. The patient received  80 cc of contrast.  Image Quality: Fair  Aorta: Normal size.  No calcifications.  No dissection.  Aortic Valve:  Trileaflet.  No calcifications.  Coronary Arteries:  Normal coronary origin.  Right dominance.  RCA is a large dominant artery that gives rise to PDA and PLA. There is no plaque.  Left main is a large artery that gives rise to LAD and LCX arteries.  LAD is a large vessel that has no plaque.  LCX is a non-dominant artery that gives rise to one large OM1 branch. There is no plaque.  Coronary Calcium Score:  Left main: 0  Left anterior descending artery: 0  Left circumflex artery: 0  Right coronary artery: 0  Total: 0  Percentile: 0  Other findings:  Normal  pulmonary vein drainage into the left atrium.  Normal left atrial appendage without a thrombus.  Normal size of the pulmonary artery.  IMPRESSION: 1. Coronary calcium score of 0. This was 0 percentile for age and sex matched control.  2. Normal coronary origin with right dominance.  3. CAD-RADS 0. No evidence of CAD (0%). Consider non-atherosclerotic causes of chest pain.  The noncardiac portion of this study will be interpreted in separate report by the radiologist.  Electronically Signed: By: Kardie  Tobb D.O. On: 02/26/2023 13:53     ______________________________________________________________________________________________      EKG Interpretation Date/Time:  Wednesday November 23 2024 11:25:06 EST Ventricular Rate:  81 PR Interval:  122 QRS Duration:  128 QT Interval:  418 QTC Calculation: 485 R Axis:   102  Text Interpretation: Sinus rhythm with frequent Premature ventricular complexes Right bundle branch block When compared with ECG of 15-Apr-2023 13:30, PREVIOUS ECG IS PRESENT Confirmed by Monetta Rogue ([id number]) on 11/23/2024 11:30:24 AM   Recent Labs: 01/27/2024: BUN 11; Creatinine, Ser 0.57; Hemoglobin 13.4; Platelets 241; Potassium 4.4; Sodium 140  Recent Lipid Panel     Component Value Date/Time   CHOL 250 (H) 11/17/2023 1033   CHOL 240 (H) 07/20/2019 1624   TRIG 61.0 11/17/2023 1033   HDL 121.90 11/17/2023 1033   HDL 123 07/20/2019 1624   CHOLHDL 2 11/17/2023 1033   VLDL 12.2 11/17/2023 1033   LDLCALC 116 (H) 11/17/2023 1033   LDLCALC 96 07/20/2019 1624    Physical Exam:    VS:  BP 120/70   Pulse 81   Ht 5' 8 (1.727 m)   Wt 152 lb 12.8 oz (69.3 kg)   SpO2 97%   BMI 23.23 kg/m     Wt Readings from Last 3 Encounters:  11/23/24 152 lb 12.8 oz (69.3 kg)  07/12/24 156 lb 6.4 oz (70.9 kg)  02/02/24 156 lb (70.8 kg)     GEN:  Well nourished, well developed in no acute distress HEENT: Normal NECK: No JVD; No carotid bruits LYMPHATICS: No lymphadenopathy CARDIAC: RRR, no murmurs, rubs, gallops RESPIRATORY:  Clear to auscultation without rales, wheezing or rhonchi  ABDOMEN: Soft, non-tender, non-distended MUSCULOSKELETAL:  No edema; No deformity  SKIN: Warm and dry NEUROLOGIC:  Alert and oriented x 3 PSYCHIATRIC:  Normal affect    Signed, Rogue Monetta, MD  11/23/2024 12:03 PM    [facility]      [1]  Current Meds  Medication Sig   ALPRAZolam  (XANAX ) 0.5 MG tablet Take 1 tablet (0.5 mg total) by mouth 3 (three) times daily as needed.   Biotin 10 MG CAPS Take 10 mg by mouth daily.   Calcium Carbonate (CALCIUM 500 PO) Take 500 mg by mouth 2 (two) times daily.   Cholecalciferol (VITAMIN D3) 125 MCG (5000 UT) CAPS Take 5,000 Units by mouth daily.   cyanocobalamin  (VITAMIN B12) 1000 MCG/ML injection INJECT 1 ML INTO THE MUSCLE EVERY 30 DAYS AS DIRECTED   losartan -hydrochlorothiazide (HYZAAR) 100-12.5 MG tablet TAKE 1 TABLET BY MOUTH DAILY   Multiple Vitamins-Minerals (HAIR/SKIN/NAILS/BIOTIN PO) Take 1 tablet by mouth daily.   sertraline  (ZOLOFT ) 100 MG tablet TAKE 1 TABLET BY MOUTH DAILY   SYRINGE-NEEDLE, DISP, 3 ML 23G X 1 3 ML MISC 1 Device by Does not apply route every 30 (thirty) days.   [DISCONTINUED]  methocarbamol  (ROBAXIN ) 500 MG tablet Take 1 tablet (500 mg total) by mouth every 8 (eight) hours as needed for muscle spasms.   [DISCONTINUED]  naproxen  (NAPROSYN ) 500 MG tablet Take 1 tablet (500 mg total) by mouth 3 (three) times daily with meals. For pain.   [DISCONTINUED] oxyCODONE  (ROXICODONE ) 5 MG immediate release tablet Take 1 tablet (5 mg total) by mouth every 4 (four) hours as needed for severe pain (pain score 7-10). Do not take with Xanax    "

## 2024-11-23 ENCOUNTER — Ambulatory Visit: Attending: Cardiology | Admitting: Cardiology

## 2024-11-23 ENCOUNTER — Encounter: Payer: Self-pay | Admitting: Cardiology

## 2024-11-23 ENCOUNTER — Ambulatory Visit: Attending: Cardiology

## 2024-11-23 VITALS — BP 120/70 | HR 81 | Ht 68.0 in | Wt 152.8 lb

## 2024-11-23 DIAGNOSIS — I4719 Other supraventricular tachycardia: Secondary | ICD-10-CM

## 2024-11-23 DIAGNOSIS — I491 Atrial premature depolarization: Secondary | ICD-10-CM | POA: Diagnosis not present

## 2024-11-23 DIAGNOSIS — I10 Essential (primary) hypertension: Secondary | ICD-10-CM | POA: Diagnosis not present

## 2024-11-23 DIAGNOSIS — I493 Ventricular premature depolarization: Secondary | ICD-10-CM

## 2024-11-23 NOTE — Patient Instructions (Signed)
 Medication Instructions:  Your physician has recommended you make the following change in your medication:   Take OTC Magnesium 400 mg daily   *If you need a refill on your cardiac medications before your next appointment, please call your pharmacy*   Lab Work: None ordered If you have labs (blood work) drawn today and your tests are completely normal, you will receive your results only by: MyChart Message (if you have MyChart) OR A paper copy in the mail If you have any lab test that is abnormal or we need to change your treatment, we will call you to review the results.   Testing/Procedures: A zio monitor was ordered today. It will remain on for 7 days. Remove 11/30/24. You will then return monitor and event diary in provided box. It takes 1-2 weeks for report to be downloaded and returned to us . We will call you with the results. If monitor falls off or has orange flashing light, please call Zio for further instructions.    Follow-Up: At [facility], you and your health needs are our priority.  As part of our continuing mission to provide you with exceptional heart care, we have created designated Provider Care Teams.  These Care Teams include your primary Cardiologist (physician) and Advanced Practice Providers (APPs -  Physician Assistants and Nurse Practitioners) who all work together to provide you with the care you need, when you need it.  We recommend signing up for the patient portal called MyChart.  Sign up information is provided on this After Visit Summary.  MyChart is used to connect with patients for Virtual Visits (Telemedicine).  Patients are able to view lab/test results, encounter notes, upcoming appointments, etc.  Non-urgent messages can be sent to your provider as well.   To learn more about what you can do with MyChart, go to [email or web address].    Your next appointment:   1 year(s)  The format for your next appointment:   In Person  Provider:    Redell Leiter, MD    Other Instructions none  Important Information About Sugar

## 2024-11-25 ENCOUNTER — Ambulatory Visit: Admitting: Physician Assistant

## 2024-11-25 ENCOUNTER — Encounter: Payer: Self-pay | Admitting: Physician Assistant

## 2024-11-25 VITALS — BP 124/76 | HR 75 | Temp 97.3°F | Ht 66.93 in | Wt 157.2 lb

## 2024-11-25 DIAGNOSIS — E559 Vitamin D deficiency, unspecified: Secondary | ICD-10-CM | POA: Diagnosis not present

## 2024-11-25 DIAGNOSIS — I493 Ventricular premature depolarization: Secondary | ICD-10-CM | POA: Diagnosis not present

## 2024-11-25 DIAGNOSIS — Z23 Encounter for immunization: Secondary | ICD-10-CM

## 2024-11-25 DIAGNOSIS — E538 Deficiency of other specified B group vitamins: Secondary | ICD-10-CM | POA: Diagnosis not present

## 2024-11-25 DIAGNOSIS — Z0000 Encounter for general adult medical examination without abnormal findings: Secondary | ICD-10-CM

## 2024-11-25 DIAGNOSIS — I10 Essential (primary) hypertension: Secondary | ICD-10-CM | POA: Diagnosis not present

## 2024-11-25 LAB — COMPREHENSIVE METABOLIC PANEL WITH GFR
ALT: 20 U/L (ref 3–35)
AST: 29 U/L (ref 5–37)
Albumin: 4.4 g/dL (ref 3.5–5.2)
Alkaline Phosphatase: 89 U/L (ref 39–117)
BUN: 12 mg/dL (ref 6–23)
CO2: 26 meq/L (ref 19–32)
Calcium: 9.6 mg/dL (ref 8.4–10.5)
Chloride: 103 meq/L (ref 96–112)
Creatinine, Ser: 0.58 mg/dL (ref 0.40–1.20)
GFR: 97.58 mL/min
Glucose, Bld: 88 mg/dL (ref 70–99)
Potassium: 4.1 meq/L (ref 3.5–5.1)
Sodium: 139 meq/L (ref 135–145)
Total Bilirubin: 0.5 mg/dL (ref 0.2–1.2)
Total Protein: 7.1 g/dL (ref 6.0–8.3)

## 2024-11-25 LAB — LIPID PANEL
Cholesterol: 246 mg/dL — ABNORMAL HIGH (ref 28–200)
HDL: 140 mg/dL
LDL Cholesterol: 91 mg/dL (ref 10–99)
NonHDL: 106.27
Total CHOL/HDL Ratio: 2
Triglycerides: 74 mg/dL (ref 10.0–149.0)
VLDL: 14.8 mg/dL (ref 0.0–40.0)

## 2024-11-25 LAB — CBC WITH DIFFERENTIAL/PLATELET
Basophils Absolute: 0 K/uL (ref 0.0–0.1)
Basophils Relative: 0.2 % (ref 0.0–3.0)
Eosinophils Absolute: 0.3 K/uL (ref 0.0–0.7)
Eosinophils Relative: 4.2 % (ref 0.0–5.0)
HCT: 39.7 % (ref 36.0–46.0)
Hemoglobin: 13.3 g/dL (ref 12.0–15.0)
Lymphocytes Relative: 22.8 % (ref 12.0–46.0)
Lymphs Abs: 1.5 K/uL (ref 0.7–4.0)
MCHC: 33.5 g/dL (ref 30.0–36.0)
MCV: 94.7 fl (ref 78.0–100.0)
Monocytes Absolute: 0.9 K/uL (ref 0.1–1.0)
Monocytes Relative: 13.8 % — ABNORMAL HIGH (ref 3.0–12.0)
Neutro Abs: 3.9 K/uL (ref 1.4–7.7)
Neutrophils Relative %: 59 % (ref 43.0–77.0)
Platelets: 243 K/uL (ref 150.0–400.0)
RBC: 4.2 Mil/uL (ref 3.87–5.11)
RDW: 13.1 % (ref 11.5–15.5)
WBC: 6.6 K/uL (ref 4.0–10.5)

## 2024-11-25 LAB — VITAMIN D 25 HYDROXY (VIT D DEFICIENCY, FRACTURES): VITD: 53.92 ng/mL (ref 30.00–100.00)

## 2024-11-25 LAB — VITAMIN B12: Vitamin B-12: 440 pg/mL (ref 211–911)

## 2024-11-25 LAB — HEMOGLOBIN A1C: Hgb A1c MFr Bld: 5.3 % (ref 4.6–6.5)

## 2024-11-25 LAB — TSH: TSH: 3.31 u[IU]/mL (ref 0.35–5.50)

## 2024-11-25 NOTE — Patient Instructions (Signed)
 Great to see you!

## 2024-11-25 NOTE — Progress Notes (Signed)
 "   Patient ID: Courtney Watson, female    DOB: 06-Dec-1960, 64 y.o.   MRN: [id number]   Assessment & Plan:  Annual physical exam -     CBC with Differential/Platelet -     Comprehensive metabolic panel with GFR -     Hemoglobin A1c -     Lipid panel -     TSH -     Vitamin B12 -     VITAMIN D  25 Hydroxy (Vit-D Deficiency, Fractures)  B12 deficiency -     Vitamin B12  Immunization due -     Pneumococcal conjugate vaccine 20-valent  Vitamin D  deficiency -     VITAMIN D  25 Hydroxy (Vit-D Deficiency, Fractures)  Primary hypertension  PVC's (premature ventricular contractions)      Assessment and Plan Assessment & Plan Ventricular premature contractions Experiencing a significant number of PVCs, asymptomatic except during sleep. No dizziness or syncope. Cardiologist advised magnesium supplementation. Last echocardiogram in 2019; consider repeat to assess cardiac structure due to PVC burden. - Discuss with cardiologist the possibility of ordering an echocardiogram to assess cardiac structure. - Continue magnesium supplementation as advised by cardiologist.  Essential hypertension Blood pressure well-controlled on losartan  and hydrochlorothiazide. - Continue losartan  and hydrochlorothiazide.  General Health Maintenance Up to date with colonoscopy, mammogram, and cervical cancer screening. Recent stress-related weight loss and hair changes noted. Engages in regular walking but lacks weight-bearing exercises. - Ordered blood work to assess overall health and check for any abnormalities related to recent weight loss and hair changes. - Encouraged weight-bearing exercises 2-3 times a week to maintain muscle mass.   Age-appropriate screening and counseling performed today. Will check labs and call with results. Preventive measures discussed and printed in AVS for patient.   Patient Counseling: [x]   Nutrition: Stressed importance of moderation in sodium/caffeine intake, saturated  fat and cholesterol, caloric balance, sufficient intake of fresh fruits, vegetables, and fiber.  [x]   Stressed the importance of regular exercise.   []   Substance Abuse: Discussed cessation/primary prevention of tobacco, alcohol, or other drug use; driving or other dangerous activities under the influence; availability of treatment for abuse.   [x]   Injury prevention: Discussed safety belts, safety helmets, smoke detector, smoking near bedding or upholstery.   []   Sexuality: Discussed sexually transmitted diseases, partner selection, use of condoms, avoidance of unintended pregnancy  and contraceptive alternatives.   [x]   Dental health: Discussed importance of regular tooth brushing, flossing, and dental visits.  [x]   Health maintenance and immunizations reviewed. Please refer to Health maintenance section.       Return in about 1 year (around 11/25/2025) for physical, fasting labs .    Subjective:    Chief Complaint  Patient presents with   Annual Exam    Pt in office for annual CPE and labs; pt reports no changes since last physical, pt recently lost her job so been under some stress;     HPI Discussed the use of AI scribe software for clinical note transcription with the patient, who gave verbal consent to proceed.  History of Present Illness Courtney Watson is a 64 year old female who presents for her annual physical exam.  She is experiencing significant stress due to losing her job of 21 years, resulting in a weight loss of 19 pounds, hair loss, and gastrointestinal issues. She is under psychiatric care and is taking Zoloft  and Xanax . She receives support from family members but has not engaged in formal counseling.  She underwent  arthroscopy and rotator cuff repair on her right shoulder in March of the previous year. Recovery was challenging, but her shoulder is improving, although it occasionally causes discomfort.  She describes a peculiar issue with her arm that began in  October with a red ring resembling a bullseye, which later turned into a black bruise and then a pronounced vessel. She denies any trauma to the area and reports no pain, but feels discomfort when resting her arm on a surface.  No bleeding issues are reported.  She experiences frequent PVCs, which are more noticeable when she is still rather than active. She does not experience dizziness or fainting and is not on a beta blocker, but takes magnesium.  She engages in regular walking but notes a loss of muscle mass, which she attributes to stress and weight loss.     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   Colon polyps    Degenerative superior labral anterior-to-posterior (SLAP) tear of right shoulder 02/07/2024   Eating disorder    Fatigue 01/25/2018   Fibroid    H/O rhinoplasty    History of chicken pox    History of neoplasm 07/12/2024   Hypertension    Irregular heart beat 04/01/2018   Knee pain, right 08/22/2015   Left thyroid  nodule    needs follow up ultrasound in 2021   Lentigo 07/12/2024   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   Malignant tumor of colon (HCC) 04/01/2018   Melanocytic nevus of trunk 07/12/2024   Neck pain 09/12/2014   Nevus of axillary region 11/22/2024   Osteopenia 2018   hips and spine   Other benign neoplasm of skin of trunk 07/12/2024   Palpitations 04/01/2018   Perimenopausal disorder 04/01/2018   Post-inflammatory hyperpigmentation 07/12/2024   Seasonal allergies    Shortness of breath on exertion 06/08/2017   Synovitis of right shoulder 02/07/2024   Ulcerative  colitis (HCC)    Uterine leiomyoma 04/01/2018   Vitamin D  deficiency     Past Surgical History:  Procedure Laterality Date   BICEPT TENODESIS Right 02/02/2024   Procedure: TENODESIS, BICEPS;  Surgeon: Addie Cordella Hamilton, MD;  Location: [facility] OR;  Service: Orthopedics;  Laterality: Right;   COLON SURGERY  2005   colon cancer--Dr.Rosenbower   ENDOMETRIAL ABLATION  2012   Dr.Lomax   KNEE ARTHROSCOPY Right    MYOMECTOMY  2012   Dr.Lomax   SHOULDER OPEN ROTATOR CUFF REPAIR Right 02/02/2024   Procedure: REPAIR, ROTATOR CUFF, OPEN;  Surgeon: Addie Cordella Hamilton, MD;  Location[facility];  Service: Orthopedics;  Laterality: Right;    Family History  Problem Relation Age of Onset   Osteoporosis Mother    Hypertension Mother    Arthritis Mother    Hearing loss Mother    Cancer Father        prostate   Parkinson's disease Father        Dec age 53 from parkinsons/Lewey Body Dementia?   Hypertension Father    Hyperlipidemia Father    Osteoarthritis Father    Rheum arthritis Father  Cancer Maternal Uncle 45       colon cancer   Depression Maternal Grandmother    Diabetes Maternal Grandfather    Heart disease Maternal Grandfather    Hypertension Maternal Grandfather    Breast cancer Paternal Grandmother    Cancer Paternal Grandfather     Social History[1]   Allergies[2]  Review of Systems NEGATIVE UNLESS OTHERWISE INDICATED IN HPI      Objective:     BP 124/76 (BP Location: Left Arm, Patient Position: Sitting, Cuff Size: Normal)   Pulse 75   Temp (!) 97.3 F (36.3 C) (Temporal)   Ht 5' 6.93 (1.7 m)   Wt 157 lb 3.2 oz (71.3 kg)   SpO2 99%   BMI 24.67 kg/m   Wt Readings from Last 3 Encounters:  11/25/24 157 lb 3.2 oz (71.3 kg)  11/23/24 152 lb 12.8 oz (69.3 kg)  07/12/24 156 lb 6.4 oz (70.9 kg)    BP Readings from Last 3 Encounters:  11/25/24 124/76  11/23/24 120/70  07/12/24 129/85     Physical Exam Vitals and nursing note reviewed.  Constitutional:       Appearance: Normal appearance. She is normal weight. She is not toxic-appearing.  HENT:     Head: Normocephalic and atraumatic.     Right Ear: Tympanic membrane, ear canal and external ear normal.     Left Ear: Tympanic membrane, ear canal and external ear normal.     Nose: Nose normal.     Mouth/Throat:     Mouth: Mucous membranes are moist.  Eyes:     Extraocular Movements: Extraocular movements intact.     Conjunctiva/sclera: Conjunctivae normal.     Pupils: Pupils are equal, round, and reactive to light.  Cardiovascular:     Rate and Rhythm: Rhythm irregular.     Heart sounds: Normal heart sounds. No murmur heard. Pulmonary:     Effort: Pulmonary effort is normal.     Breath sounds: Normal breath sounds.  Abdominal:     General: Abdomen is flat. Bowel sounds are normal.     Palpations: Abdomen is soft.  Musculoskeletal:        General: No tenderness or deformity. Normal range of motion.     Cervical back: Normal range of motion and neck supple.     Right lower leg: No edema.     Left lower leg: No edema.  Skin:    General: Skin is warm and dry.  Neurological:     General: No focal deficit present.     Mental Status: She is alert and oriented to person, place, and time.  Psychiatric:        Mood and Affect: Mood normal.        Behavior: Behavior normal.        Thought Content: Thought content normal.        Judgment: Judgment normal.             Clarence Cogswell M Camala Talwar, PA-C     [1]  Social History Tobacco Use   Smoking status: Former    Types: E-cigarettes    Passive exposure: Never   Smokeless tobacco: Never  Vaping Use   Vaping status: Every Day   Start date: 11/18/2011  Substance Use Topics   Alcohol use: Yes    Alcohol/week: 7.0 standard drinks of alcohol    Types: 7 Cans of beer per week    Comment: on the weekends   Drug use: Never  [2] No Known  Allergies  "

## 2024-11-27 ENCOUNTER — Ambulatory Visit: Payer: Self-pay | Admitting: Physician Assistant

## 2024-12-08 ENCOUNTER — Encounter: Payer: Self-pay | Admitting: Cardiology

## 2024-12-08 DIAGNOSIS — I491 Atrial premature depolarization: Secondary | ICD-10-CM | POA: Diagnosis not present

## 2024-12-08 DIAGNOSIS — I4719 Other supraventricular tachycardia: Secondary | ICD-10-CM

## 2024-12-14 ENCOUNTER — Other Ambulatory Visit: Payer: Self-pay | Admitting: Physician Assistant

## 2024-12-14 ENCOUNTER — Other Ambulatory Visit: Payer: Self-pay | Admitting: Psychiatry

## 2024-12-14 DIAGNOSIS — F410 Panic disorder [episodic paroxysmal anxiety] without agoraphobia: Secondary | ICD-10-CM

## 2024-12-14 NOTE — Telephone Encounter (Signed)
 Sent MyChart message to schedule, canceled Dec appt.

## 2025-01-09 ENCOUNTER — Encounter: Admitting: Physician Assistant
